# Patient Record
Sex: Male | Born: 1940 | Race: White | Hispanic: No | Marital: Married | Smoking: Former smoker
Health system: Southern US, Community
[De-identification: ages and names within clinical notes are randomized; demographics above are authoritative.]

## PROBLEM LIST (undated history)

## (undated) DIAGNOSIS — K219 Gastro-esophageal reflux disease without esophagitis: Secondary | ICD-10-CM

## (undated) DIAGNOSIS — D649 Anemia, unspecified: Secondary | ICD-10-CM

## (undated) DIAGNOSIS — T4145XA Adverse effect of unspecified anesthetic, initial encounter: Secondary | ICD-10-CM

## (undated) DIAGNOSIS — Z8701 Personal history of pneumonia (recurrent): Secondary | ICD-10-CM

## (undated) DIAGNOSIS — I739 Peripheral vascular disease, unspecified: Secondary | ICD-10-CM

## (undated) DIAGNOSIS — E119 Type 2 diabetes mellitus without complications: Secondary | ICD-10-CM

## (undated) DIAGNOSIS — R351 Nocturia: Secondary | ICD-10-CM

## (undated) DIAGNOSIS — I779 Disorder of arteries and arterioles, unspecified: Secondary | ICD-10-CM

## (undated) DIAGNOSIS — T8859XA Other complications of anesthesia, initial encounter: Secondary | ICD-10-CM

## (undated) DIAGNOSIS — Z8709 Personal history of other diseases of the respiratory system: Secondary | ICD-10-CM

## (undated) DIAGNOSIS — I10 Essential (primary) hypertension: Secondary | ICD-10-CM

## (undated) DIAGNOSIS — N390 Urinary tract infection, site not specified: Secondary | ICD-10-CM

## (undated) DIAGNOSIS — Z973 Presence of spectacles and contact lenses: Secondary | ICD-10-CM

## (undated) DIAGNOSIS — Z87891 Personal history of nicotine dependence: Secondary | ICD-10-CM

## (undated) DIAGNOSIS — E785 Hyperlipidemia, unspecified: Secondary | ICD-10-CM

## (undated) DIAGNOSIS — M1990 Unspecified osteoarthritis, unspecified site: Secondary | ICD-10-CM

## (undated) HISTORY — DX: Type 2 diabetes mellitus without complications: E11.9

## (undated) HISTORY — PX: CAROTID ENDARTERECTOMY: SUR193

## (undated) HISTORY — PX: COLONOSCOPY: SHX174

## (undated) HISTORY — PX: APPENDECTOMY: SHX54

## (undated) HISTORY — PX: TONSILLECTOMY: SUR1361

## (undated) HISTORY — DX: Hyperlipidemia, unspecified: E78.5

## (undated) HISTORY — DX: Disorder of arteries and arterioles, unspecified: I77.9

## (undated) HISTORY — DX: Peripheral vascular disease, unspecified: I73.9

## (undated) HISTORY — PX: WRIST SURGERY: SHX841

## (undated) HISTORY — PX: CARDIAC CATHETERIZATION: SHX172

## (undated) HISTORY — DX: Personal history of nicotine dependence: Z87.891

## (undated) HISTORY — PX: NASAL POLYP SURGERY: SHX186

## (undated) HISTORY — DX: Essential (primary) hypertension: I10

---

## 1987-01-28 HISTORY — PX: OTHER SURGICAL HISTORY: SHX169

## 1999-04-22 ENCOUNTER — Encounter: Payer: Self-pay | Admitting: Cardiology

## 1999-04-22 ENCOUNTER — Inpatient Hospital Stay (HOSPITAL_COMMUNITY): Admission: EM | Admit: 1999-04-22 | Discharge: 1999-04-23 | Payer: Self-pay | Admitting: Emergency Medicine

## 1999-09-24 ENCOUNTER — Ambulatory Visit (HOSPITAL_COMMUNITY): Admission: RE | Admit: 1999-09-24 | Discharge: 1999-09-24 | Payer: Self-pay | Admitting: Gastroenterology

## 2002-10-05 ENCOUNTER — Encounter: Payer: Self-pay | Admitting: Family Medicine

## 2002-10-05 ENCOUNTER — Encounter: Admission: RE | Admit: 2002-10-05 | Discharge: 2002-10-05 | Payer: Self-pay | Admitting: Family Medicine

## 2002-10-21 ENCOUNTER — Ambulatory Visit (HOSPITAL_BASED_OUTPATIENT_CLINIC_OR_DEPARTMENT_OTHER): Admission: RE | Admit: 2002-10-21 | Discharge: 2002-10-21 | Payer: Self-pay | Admitting: *Deleted

## 2002-10-21 ENCOUNTER — Encounter (INDEPENDENT_AMBULATORY_CARE_PROVIDER_SITE_OTHER): Payer: Self-pay | Admitting: Specialist

## 2002-10-21 ENCOUNTER — Ambulatory Visit (HOSPITAL_COMMUNITY): Admission: RE | Admit: 2002-10-21 | Discharge: 2002-10-21 | Payer: Self-pay | Admitting: *Deleted

## 2004-10-07 ENCOUNTER — Ambulatory Visit (HOSPITAL_COMMUNITY): Admission: RE | Admit: 2004-10-07 | Discharge: 2004-10-07 | Payer: Self-pay | Admitting: Gastroenterology

## 2007-06-02 ENCOUNTER — Encounter: Admission: RE | Admit: 2007-06-02 | Discharge: 2007-06-02 | Payer: Self-pay | Admitting: Cardiovascular Disease

## 2007-06-08 ENCOUNTER — Ambulatory Visit (HOSPITAL_COMMUNITY): Admission: RE | Admit: 2007-06-08 | Discharge: 2007-06-08 | Payer: Self-pay | Admitting: Cardiology

## 2007-06-22 ENCOUNTER — Ambulatory Visit (HOSPITAL_COMMUNITY): Admission: RE | Admit: 2007-06-22 | Discharge: 2007-06-23 | Payer: Self-pay | Admitting: Cardiovascular Disease

## 2007-06-22 DIAGNOSIS — I739 Peripheral vascular disease, unspecified: Secondary | ICD-10-CM

## 2007-06-22 HISTORY — DX: Peripheral vascular disease, unspecified: I73.9

## 2010-06-11 NOTE — Cardiovascular Report (Signed)
NAMECRESPIN, MOSTOLLER NO.:  [id number]   MEDICAL RECORD NO.:  [id number]          PATIENT TYPE:  OIB   LOCATION:  4743                         FACILITY:  [facility]   PHYSICIAN:  Nanetta Batty, M.D.   DATE OF BIRTH:  [birth year]   DATE OF PROCEDURE:  DATE OF DISCHARGE:                            CARDIAC CATHETERIZATION   Mr. William Burke is a 70 year old mildly overweight Caucasian male with  history of remote tobacco abuse, diabetes, and hypertension.  He was  referred for right hip pain.  Dopplers at our the office suggested  bilateral iliac disease.  He had normal 2D echo and Myoview stress test.  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  He presents now for PTA and cover stenting of  both iliac arteries.  Dr. Jacinto Halim assisted.   PROCEDURE DESCRIPTION:  The patient was brought to second floor [facility] in postabsorptive state.  He was premedicated  with p.o. Valium, IV fentanyl, and Versed.  His right and left groins  were prepped and draped in the usual sterile fashion.  Xylocaine 1% was  used as local anesthesia.  A 7-French 30-cm long bright tip Cordis  sheath was then inserted in the right and left common femoral arteries  under fluoroscopic control.  A 35 Wholey wires were used to cross the  lesions bilaterally.  The patient received 4000 units of heparin  intravenously.  Visipaque dye was used in the entirety of the case.  Abdominal aortogram was performed with a marker pigtail through the  left sheath allowing [place] to measure the length of the aneurysmal segment.  Two 7 x 59 [employer] iCAST PTFE cover stents were then chosen and  placed over the 0.035 Wholey wires through the 7-French sheath and  carefully positioned at the origin of both the iliac arteries with some  difficulty.  These were then deployed at 10  atmospheres simultaneously  using kissing balloon technique.  The origin of the iliac artery  stents at the aortic bifurcation were then simultaneously dilated with  0.082 Powerflexes using kissing balloon technique at 10 atmospheres and  the distal ends of each stent were then snared with a 9 x 4 Powerflex on  the right and 10 x 4 on the left at 6 atmospheres resulting in reduction  of 95% ostial calcific iliac stenosis to 0% residual with exclusion of  the aneurysmal segment in excellent distal apposition.  The patient  tolerated the procedure well and remained electrocardiographically and  hemodynamically stable throughout the case.  Ending ACT was 140.   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  The sheaths were removed and pressures  on the groin to achieve good hemostasis.  The patient left the lab in  stable condition.  He will be gently hydrated overnight, discharged home  in the morning, and will get followup Dopplers as an outpatient.  He  left  the lab in stable condition.      Nanetta Batty, M.D.  Electronically Signed     JB/MEDQ  D:  06/22/2007  T:  06/23/2007  Job:  [id number]   cc:   Marjory Lies, M.D.  [facility]

## 2010-06-11 NOTE — Cardiovascular Report (Signed)
NAMEKEMONTE, ULLMAN                ACCOUNT NO.:  [id number]   MEDICAL RECORD NO.:  [id number]          PATIENT TYPE:  AMB   LOCATION:  SDS                          FACILITY:  [facility]   PHYSICIAN:  Nanetta Batty, M.D.   DATE OF BIRTH:  06/30/1940   DATE OF PROCEDURE:  DATE OF DISCHARGE:                            CARDIAC CATHETERIZATION   Mr. Sallade is a 70 year old gentleman referred to me for claudication.  He is a ex-smoker and does have a history of diabetes and hypertension.  He had a normal Myoview and Dopplers, which suggested high-grade  bilateral iliac disease.  He presents now for angiography to define his  anatomy and potential intervention.   DESCRIPTION OF PROCEDURE:  The patient was brought to the 2nd floor,  [facility] in the postabsorptive state.  He was  premedicated with p.o. Valium and IV fentanyl.  His left groin was  prepped and shaved in usual sterile fashion.  Xylocaine 1% was used for  local anesthesia.  A 5-French sheath was inserted into the left femoral  artery using standard Seldinger technique.  A 5-French tennis-racket  catheter was used along with a wooly wire for abdominal aortography with  bifemoral runoff using digital subtraction bolus-chase step table  technique.  Visipaque dye was used for the entirety of the case.  Aortic  pressure was monitored during the case.   ANGIOGRAPHIC RESULTS:  1. Abdominal aorta;      a.     Renal arteries - normal.      b.     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  2. Left lower extremity;  2A.  90% ostial left common iliac artery stenosis with post dilatation  followed by another 50%-70% stenosis at the takeoff of the hypogastric.  2B.  Two-vessel runoff with occluded anterior tibial.  1. Right lower extremity;  3A.  A 95% calcified ostial right common iliac artery stenosis.  3B.   Two-vessel runoff with occluded anterior tibial.   IMPRESSION:  Mr. Mays has bilateral ostial iliac disease, probably  percutaneously revascularizable.  The wire and catheter were removed.  Sheath was removed and pressures on the groin to achieve hemostasis.  The patient left lab in stable condition.  He will be discharged home  actually as an outpatient.  We will see him back in the office in 1 week  at which time we will schedule his intervention as a combined case with  Dr. Jacinto Halim and myself.  I discussed this with Dr. Doristine Counter. The patient  left the lab in stable condition.      Nanetta Batty, M.D.  Electronically Signed    JB/MEDQ  D:  06/08/2007  T:  06/09/2007  Job:  [id number]   cc:   Marjory Lies, M.D.

## 2010-06-14 NOTE — Op Note (Signed)
NAME:  William Burke, William Burke                          ACCOUNT NO.:  [id number]   MEDICAL RECORD NO.:  [id number]                   PATIENT TYPE:  AMB   LOCATION:  DSC                                  FACILITY:  [facility]   PHYSICIAN:  Kathy Breach, M.D.                   DATE OF BIRTH:  November 27, 1940   DATE OF PROCEDURE:  10/21/2002  DATE OF DISCHARGE:                                 OPERATIVE REPORT   PREOPERATIVE DIAGNOSES:  1. Obstructive nasal and sinus polyps bilaterally.  2. Deviated nasal septum.   POSTOPERATIVE DIAGNOSES:  1. Obstructive nasal and sinus polyps bilaterally.  2. Deviated nasal septum.   PROCEDURE:  1. Nasal septoplasty.  2. Bilateral endoscopic complete ethmoidectomies.  3. Bilateral endoscopic ostial meatal windows with removal of polyps.   DESCRIPTION OF PROCEDURE:  With the patient under general orotracheal  anesthesia a nasal block was applied with 4% Xylocaine with ephedrine soaked  olive-tipped probes applied to the sphenopalatine and anterior ethmoid nerve  areas bilaterally. Then 1% Xylocaine with 1:100,000 solution of epinephrine  was infiltrated in the columella nasal septum and middle meatal areas as the  procedure progressed. Cotton pledgets soaked with 4% Xylocaine with Betadine  solution were inserted bilaterally.   Inspection of the patient's nose after vasoconstricting revealed voluminous  polyps nearly totally obstructing the nose bilaterally that obstructed any  visualization of the anterior aspect of the middle turbinates and hung down  to the lower level of the extent of the inferior  turbinates. The patient  had dislocated nasal septum to the right with a large maximum crest spur in  the forenose to the right and generalized deflection of the septum otherwise  superiorly to the left, making adequate space for the endoscopic procedure  untenable without correction.   A caudal incision was made inside the left nasal vestibule. Mucosal flaps  were  elevated of the cartilaginous and bony septum bilaterally. The  patient's posteroinferior quadrilateral cartilage was dislocated from and  overlapped the maxillary crest which extended into the floor of the nose to  the right. Posteroinferior quadrilateral cartilage was removed along with  the maxillary bony crest. Posterosuperior quadrilateral cartilage was  deflected to the left with deflection of the thickened perpendicular ethmoid  plate. This was all taken down and removed. This allowed the septum to come  to the near midline  position. The patient in general had irritated  appearing and thickened mucosal surfaces as well as the mucoid polyps  appearing somewhat inflammatory.   Under endoscopic visualization, using the suction debrider, beginning on the  left side, polyps were  removed, extending back, identifying the anterior  aspect of the middle  turbinate. The polyps were  completely cleaned from  the middle meatus and a total complete ethmoidectomy was completed. Evident  on the preoperative CT scans was a large posterior ethmoid cell impinging  into the  maxillary sinus  area immediately posterior  to the ostium. This  was encountered and completely cleaned. The natural ostium of the maxillary  sinus was enlarged posteriorly and mucosal polypoid tissue cleaned as it  presented itself to the suction debrider. The anterior ethmoid cells were  cleaned going superiorly, taking down the infundibulum.   A basically similar procedure was completed on the right side which shows a  little more involved with polyp disease, particularly a large amount of  polyps extending over the middle turbinate from the posterior ethmoids.  There is moderate bleeding throughout the case encountered. At the  completion of the case, the septal incision was closed with interrupted 4-0  chromic catgut sutures.   The nose was then packed with Vaseline gauze impregnated with Bacitracin  ointment packing the  nose and the ethmoidectomy sites firmly for hemostasis.  An additional half length of Vaseline gauze was required on both sides to  adequately completely pack the patient's nose and  obtain hemostasis.   Once the nose was packed, the oral cavity was inspected and suctioned clear.  No significant bleeding was occurring into the posterior  wall from the  posterior  nose. Blood loss by suction canister measurement was 600 mL. The  patient tolerated  the procedure satisfactorily and was taken to the  recovery room in stable general condition.                                               Kathy Breach, M.D.    Venia Minks  D:  10/21/2002  T:  10/22/2002  Job:  [id number]

## 2010-06-14 NOTE — Op Note (Signed)
NAMEGEAROLD, William Burke                ACCOUNT NO.:  [id number]   MEDICAL RECORD NO.:  [id number]          PATIENT TYPE:  AMB   LOCATION:  ENDO                         FACILITY:  [facility]   PHYSICIAN:  Anselmo Rod, M.D.  DATE OF BIRTH:  [birth year]   DATE OF PROCEDURE:  10/07/2004  DATE OF DISCHARGE:                                 OPERATIVE REPORT   PROCEDURE PERFORMED:  Screening colonoscopy.   ENDOSCOPIST:  Charna Elizabeth, M.D.   INSTRUMENT USED:  Olympus video colonoscope.   INDICATION FOR PROCEDURE:  A 70 year old white male with a family history of  colon cancer, status post right hemicolectomy for complicated appendicitis,  rule out colonic polyps, masses, etc.   PREPROCEDURE PREPARATION:  Informed consent was procured from the patient.  The patient was fasted for 8 hours prior to the procedure and prepped with a  bottle of magnesium citrate and a gallon of GoLYTELY the night prior to  procedure.  The risks and benefits of the procedure including a 10% miss  rate of cancer and polyps was discussed with the patient as well.   PREPROCEDURE PHYSICAL:  Patient had stable vital signs.  NECK:  Supple.  CHEST:  Clear to auscultation.  S1, S2 regular.  ABDOMEN:  Soft with normal bowel sounds.   DESCRIPTION OF THE PROCEDURE:  The patient was placed in the left lateral  decubitus position, sedated with 75 mg of Demerol and 7.5 mg of Versed in  slow incremental doses.  Once the patient was adequately sedated and  maintained on low flow oxygen and continuous cardiac monitoring, the Olympus  video colonoscope was advanced from the rectum to 100 mL without difficulty.  The patient had a large amount of residual stool in the colon, multiple  washings were done.  The anastomosis appeared healthy.  No masses or polyps  were seen.  There was no evidence of diverticulosis.  Small lesions could be  missed.  Retroflexion of the rectum revealed no abnormalities.   IMPRESSION:  1.  Healthy  anatomosis at 100 cm.  2.  Large amount of residual stool in the colon, patient status post right      hemicolectomy.  3.  No masses, polyps or diverticula seen.   RECOMMENDATIONS:  1.  Continue on high fiber diet with __________ intake.  2.  Repeat colonoscopy in the next 10 years unless the patient develops any      abnormal symptoms in the interim.  3.  Outpatient as need arises in the future.      Anselmo Rod, M.D.  Electronically Signed     JNM/MEDQ  D:  10/07/2004  T:  10/07/2004  Job:  [id number]   cc:   Bernadette Hoit, Dr.  [facility]  [address] [place] [address] [place] [address]

## 2010-06-14 NOTE — Procedures (Signed)
[place]  Patient:    William Burke, William Burke                       MRN: [id number] Proc. Date: 09/24/99 Adm. Date:  [id number] Attending:  Charna Elizabeth CC:         Nolon Nations, M.D., M.P.H.   Procedure Report  REFERRING PHYSICIAN:  Nolon Nations, M.D., M.P.H.  PROCEDURE PERFORMED:  Colonoscopy.  ENDOSCOPIST:  Anselmo Rod, M.D.  INSTRUMENT USED:  Olympus video colonoscope.  INDICATION FOR PROCEDURE:  Personal history of polyps in a 70 year old male The patient has had small bowel and right colon resection in the past (for reasons unclear to me).  Rule out recurrent polyps.  PREPROCEDURE PREPARATION:  Informed consent was procured from the patient. The patient was fasted for eight hours prior to the procedure and prepped with a bottle of magnesium citrate and a gallon of NuLytely on the night prior to the procedure.  PREPROCEDURE PHYSICAL:  VITAL SIGNS:  The patient had stable vital signs.  NECK:  Supple.  LUNGS:  Chest was clear to auscultation.  CARDIAC:  S1, S2 is regular.  ABDOMEN:  Soft with a well-healed infraumbilical abdominal incision.  No hepatosplenomegaly.  No masses palpable.  DESCRIPTION OF PROCEDURE:  The patient was placed in the left lateral decubitus position.  He was sedated with 55 mg of Demerol and 6 mg Versed intravenously.  Once the patient was adequately sedated and maintained on low-flow oxygen and continuous cardiac monitoring, the Olympus video colonoscope was advanced from the rectum to 1.0 meter without difficulty.  There was no evidence of masses, polyps, erosions, ulcerations or diverticular disease.  A healthy anastomosis was seen, with healthy-appearing small bowel mucosa at 100 cm.  The patient tolerated the procedure well without complications.  Small internal hemorrhoids were appreciated on retroflexion.  DIRECT IMPRESSION:  Essentially healthy appearing colon up to 100 cm.   Healthy anastomosis present, with healthy distal small bowel.  There are small, nonbleeding internal hemorrhoids.  RECOMMENDATIONS:  Repeat colorectal cancer screening is recommended in the next five years, considering his personal history of polyps.  The patient may want to have this done earlier if he develops any abnormal symptoms, such as GI bleeding, changes in bowel habits, et Karie Soda in the interim.  The patient is to follow up with his primary care physician.  He will contact me for any GI problems. DD:  09/24/99 TD:  09/24/99 Job: [id number]

## 2010-06-14 NOTE — H&P (Signed)
[place]  Patient:    William Burke, William Burke                       MRN: [id number] Adm. Date:  [id number] Attending:  Loreli Dollar Dictator:   Weyman Pedro, P.A. CC:         Kristian Covey, M.D.             Thereasa Solo. Little, M.D.                         History and Physical  CHIEF COMPLAINT:  Atrial fibrillation/flutter with rapid ventricular response.  HISTORY OF PRESENT ILLNESS:  The patient is a 70 year old male with a history of hypertension and benign prostatic hypertrophy who presented to primary M.D. last Thursday with change in blood pressure medications.  He started feeling weak on  Friday and continued until Saturday evening when he returned to primary physician secondary to low blood pressure and generalized weakness.  On Saturday evening heart rate was up to 149 with associated symptoms.  Today he is feeling heaviness in his chest with elevated heart rate.  Saturday morning he was light-headed with blood pressure decreased.  Blood pressure and pulse over the weekend with noted  increased heart rate.  Presented back to primary M.D. today secondary to elevated heart rate.  Dr. Clarene Duke was called, and the patient was transferred to the emergency room by private car.  Currently in the ER with a heart rate of 150 with EKG showing 2:1 atrial flutter.  Labs and portable chest x-ray are pending. Dr. Clarene Duke tried carotid massage and was able to see an atrial flutter presentation.  PAST MEDICAL HISTORY: 1. Hypertension x 1 year, was borderline prior to that. 2. Benign prostatic hypertrophy x 20 years. 3. Appendectomy with small bowel and large bowel resection in 1993. 4. No history of myocardial infarction, cerebrovascular accident, diabetes, cancer,    thyroid, or stroke. 5. Remote history of tobacco.  Quit one year ago, but was smoking two to 2-1/2    packs per day 40+ years. 6. Obesity. 7. History of kidney  infections.  CURRENT MEDICATIONS: 1. Accupril 20 mg p.o. q.d. 2. Hytrin 10 mg p.o. q.d. 3. Enteric-coated aspirin 325 mg p.o. q.d.  ALLERGIES:  No known drug allergies.  REVIEW OF SYSTEMS:  Positive for epistaxis at least once a week, stops after several minutes.  Increased frequency of urination with a kidney infection and prostatitis (nocturia 2-3 times a night).  Elevated weight gain of 20 pounds over the last year secondary to smoking cessation.  No skin changes, cough, nausea, vomiting, fever, chills, peptic ulcer disease, gastrointestinal bleed, constipation, diarrhea, urgency, thyroid, joint pain, or weakness.  SOCIAL HISTORY:  Married for the second time.  Father of six, grandfather of eight, great-grandfather of one.  Lives in [place].  Denies ETOH or tobacco currently. Regular diet with exercise on treadmill three times a week for at least 20 minutes at a time.  FAMILY HISTORY:  Mother deceased at age 41 with a history of hypertension and cerebral hemorrhage.  Father deceased at age 66 from multiple sclerosis with multiple complications and organ damage.  Brother alive at age 53 with a history of hypertension.  Sister alive at age 94 with no known medical problems.  No heart  disease known to the patient at the present time.  PHYSICAL EXAMINATION:  VITAL SIGNS:  Blood pressure  131/93, pulse 152, respirations 18.  GENERAL:  Alert and oriented x 3 in no acute distress.  Appears stated age.  HEENT:  Nares patent, mucosa moist.  NECK:  Supple, no jugular venous distension, no LAD, no bruits.  LUNGS:  Clear with expiratory wheeze.  HEART:  Regular rate, tachy flutter, no gross murmurs.  ABDOMEN:  Obese, soft, nontender, no bruits, no hepatosplenomegaly.  EXTREMITIES:  No clubbing, cyanosis.  Good radial and 2+ distal pulses, 0 to 1+  pitting soft edema 1/2 the way up bilateral calves.  ASSESSMENT AND PLAN:  Rapid atrial fibrillation.  The patient was given  6 mg of IV Adenocard without spontaneous conversion.  Started on IV Cardizem at 10 mcg a minute after a 20 mg bolus.  We will check a 2-D echocardiogram and TSH.  Admit to telemetry bed for further evaluation.  We will start him on intravenous heparin and Coumadin today. DD:  04/22/99 TD:  04/22/99 Job: [id number]

## 2010-10-23 LAB — CBC
HCT: 35.6 — ABNORMAL LOW
Hemoglobin: 12.3 — ABNORMAL LOW
MCHC: 34.4
MCV: 90.3
Platelets: 162
RBC: 3.95 — ABNORMAL LOW
RDW: 12.8
WBC: 6.2

## 2010-10-23 LAB — BASIC METABOLIC PANEL
BUN: 14
CO2: 27
Calcium: 8.5
Chloride: 100
Creatinine, Ser: 0.69
GFR calc Af Amer: >60
GFR calc non Af Amer: >60
Glucose, Bld: 152 — ABNORMAL HIGH
Potassium: 4.1
Sodium: 135

## 2012-06-09 ENCOUNTER — Other Ambulatory Visit (HOSPITAL_COMMUNITY): Payer: Self-pay | Admitting: Cardiovascular Disease

## 2012-06-09 DIAGNOSIS — I739 Peripheral vascular disease, unspecified: Secondary | ICD-10-CM

## 2012-06-09 DIAGNOSIS — I6529 Occlusion and stenosis of unspecified carotid artery: Secondary | ICD-10-CM

## 2012-06-24 ENCOUNTER — Ambulatory Visit (HOSPITAL_COMMUNITY)
Admission: RE | Admit: 2012-06-24 | Discharge: 2012-06-24 | Disposition: A | Discharge status: Home / Self Care | Payer: Medicare Other | Source: Ambulatory Visit | Attending: Cardiovascular Disease | Admitting: Cardiovascular Disease

## 2012-06-24 DIAGNOSIS — I6529 Occlusion and stenosis of unspecified carotid artery: Secondary | ICD-10-CM

## 2012-06-24 DIAGNOSIS — I70219 Atherosclerosis of native arteries of extremities with intermittent claudication, unspecified extremity: Secondary | ICD-10-CM

## 2012-06-24 DIAGNOSIS — I739 Peripheral vascular disease, unspecified: Secondary | ICD-10-CM | POA: Insufficient documentation

## 2012-06-24 NOTE — Progress Notes (Signed)
Carotid Duplex Completed. °William Burke ° °

## 2012-06-24 NOTE — Progress Notes (Signed)
Arterial Duplex Completed. William Burke  

## 2012-06-30 ENCOUNTER — Telehealth: Payer: Self-pay | Admitting: *Deleted

## 2012-06-30 DIAGNOSIS — I739 Peripheral vascular disease, unspecified: Secondary | ICD-10-CM

## 2012-06-30 NOTE — Telephone Encounter (Signed)
Message copied by Marella Bile on Wed Jun 30, 2012  1:17 PM ------      Message from: Runell Gess      Created: Wed Jun 30, 2012  6:41 AM       No change from prior study. Repeat in 12 months. ------

## 2012-07-13 ENCOUNTER — Encounter: Payer: Self-pay | Admitting: Physician Assistant

## 2012-07-13 DIAGNOSIS — I10 Essential (primary) hypertension: Secondary | ICD-10-CM | POA: Insufficient documentation

## 2012-07-13 DIAGNOSIS — E119 Type 2 diabetes mellitus without complications: Secondary | ICD-10-CM

## 2012-07-13 DIAGNOSIS — Z87891 Personal history of nicotine dependence: Secondary | ICD-10-CM

## 2012-07-13 DIAGNOSIS — E785 Hyperlipidemia, unspecified: Secondary | ICD-10-CM

## 2012-07-13 DIAGNOSIS — I739 Peripheral vascular disease, unspecified: Secondary | ICD-10-CM

## 2012-07-14 ENCOUNTER — Encounter: Payer: Self-pay | Admitting: Cardiovascular Disease

## 2012-07-15 ENCOUNTER — Ambulatory Visit: Payer: Medicare Other | Admitting: Cardiovascular Disease

## 2012-08-05 ENCOUNTER — Ambulatory Visit (INDEPENDENT_AMBULATORY_CARE_PROVIDER_SITE_OTHER): Payer: Medicare Other | Admitting: Cardiovascular Disease

## 2012-08-05 ENCOUNTER — Encounter: Payer: Self-pay | Admitting: Cardiovascular Disease

## 2012-08-05 VITALS — BP 130/76 | HR 70 | Ht 65.0 in | Wt 193.9 lb

## 2012-08-05 DIAGNOSIS — Z87891 Personal history of nicotine dependence: Secondary | ICD-10-CM

## 2012-08-05 DIAGNOSIS — I739 Peripheral vascular disease, unspecified: Secondary | ICD-10-CM

## 2012-08-05 DIAGNOSIS — I10 Essential (primary) hypertension: Secondary | ICD-10-CM

## 2012-08-05 NOTE — Progress Notes (Signed)
08/05/2012 William Burke   [birth year]  [id number]  Primary Physician Delorse Lek, MD Primary Cardiologist: Runell Gess MD [name]   HPI:  The patient is a very pleasant 72 year old mildly to moderately overweight married Caucasian male father of 20, grandfather to 6 grandchildren who I saw a year ago. He has a history of bilateral iliac artery PTA and stenting by myself Jun 22, 2007 using iCAST covered stents for lifestyle-limiting claudication. This resulted in marked improvement in his symptoms on Dopplers. His other problems include non-insulin-requiring diabetes, hypertension and hyperlipidemia as well as remote tobacco abuse. He denies chest pain or shortness of breath. A Myoview performed in April of 2009 was nonischemic. An echo revealed normal LV systolic function with mild concentric LVH. He does have bilateral carotid disease which by duplex ultrasound performed this past June showed progression of disease on both sides. He is neurologically asymptomatic. Recent lower extremity Dopplers performed 06/24/12 showed his stents to be widely patent.carotid Dopplers performed on the same day showed significant progression of disease on the right side now with a hemodynamic significant stenosis. Most recent lab work performed earlier this month revealed a total cholesterol of 157, LDL of 89, and HDL of 38.      Current Outpatient Prescriptions  Medication Sig Dispense Refill  . aspirin 81 MG tablet Take 81 mg by mouth daily.      [name] atorvastatin (LIPITOR) 80 MG tablet Take 80 mg by mouth daily.      [name] CINNAMON PO Take 1,000 mg by mouth daily.      [name] diltiazem (TIAZAC) 240 MG 24 hr capsule Take 240 mg by mouth 2 (two) times daily.      . fish oil-omega-3 fatty acids 1000 MG capsule Take 1 g by mouth daily.      . Flaxseed, Linseed, (FLAXSEED OIL) 1000 MG CAPS Take 1 capsule by mouth daily.      . fluticasone (VERAMYST) 27.5 MCG/SPRAY nasal spray Place 1 spray into the  nose daily.      . Garlic 1000 MG CAPS Take 2 capsules by mouth daily.      [name] glipiZIDE (GLUCOTROL) 10 MG tablet Take 1 tablet by mouth 2 (two) times daily.      . metFORMIN (GLUCOPHAGE) 1000 MG tablet Take 1,000 mg by mouth 2 (two) times daily with a meal.      . niacin 500 MG tablet Take 500 mg by mouth daily with breakfast.      . POTASSIUM GLUCONATE PO Take 99 mg by mouth daily.      . quinapril-hydrochlorothiazide (ACCURETIC) 20-12.5 MG per tablet Take 1 tablet by mouth 2 (two) times daily.      [name] terazosin (HYTRIN) 10 MG capsule Take 10 mg by mouth at bedtime.      . vitamin C (ASCORBIC ACID) 500 MG tablet Take 1,000 mg by mouth daily.      [name] zinc gluconate 50 MG tablet Take 50 mg by mouth daily.       No current facility-administered medications for this visit.    Allergies  Allergen Reactions  . Plavix (Clopidogrel Bisulfate)     Bleeding    History   Social History  . Marital Status: Married    Spouse Name: N/A    Number of Children: N/A  . Years of Education: N/A   Occupational History  . Not on file.   Social History Main Topics  . Smoking status: Former Smoker -- 3.00 packs/day  for 25 years    Types: Cigarettes    Quit date: 08/06/1987  . Smokeless tobacco: Never Used  . Alcohol Use: No  . Drug Use: No  . Sexually Active: Not on file   Other Topics Concern  . Not on file   Social History Narrative  . No narrative on file     Review of Systems: General: negative for chills, fever, night sweats or weight changes.  Cardiovascular: negative for chest pain, dyspnea on exertion, edema, orthopnea, palpitations, paroxysmal nocturnal dyspnea or shortness of breath Dermatological: negative for rash Respiratory: negative for cough or wheezing Urologic: negative for hematuria Abdominal: negative for nausea, vomiting, diarrhea, bright red blood per rectum, melena, or hematemesis Neurologic: negative for visual changes, syncope, or dizziness All other systems  reviewed and are otherwise negative except as noted above.    Blood pressure 130/76, pulse 70, height 5\' 5"  (1.651 m), weight 193 lb 14.4 oz (87.952 kg).  General appearance: alert and no distress Neck: no adenopathy, no JVD, supple, symmetrical, trachea midline, thyroid not enlarged, symmetric, no tenderness/mass/nodules and bilateral carotid bruits Lungs: clear to auscultation bilaterally Heart: regular rate and rhythm, S1, S2 normal, no murmur, click, rub or gallop Extremities: extremities normal, atraumatic, no cyanosis or edema  EKG normal sinus rhythm at 70 with right bundle branch block unchanged from prior EKG  ASSESSMENT AND PLAN:   PAD (peripheral artery disease) Recent lower extremity Dopplers revealed ABIs in the 1 range bilaterally with patent iliac Icast covered stents. The patient denies claudication. Carotid Dopplers performed 06/24/12 revealed progression of the disease of his right internal carotid artery probably in the 90% range. He is neurologically asymptomatic on aspirin. He gets nosebleeds and Plavix. I am going to repeat his carotid Dopplers next month and we'll see him back in September. This is reproduced finding      Runell Gess MD FACP,FACC,FAHA, [facility] 08/05/2012 9:07 AM

## 2012-08-05 NOTE — Patient Instructions (Signed)
Your physician recommends that you schedule a follow-up appointment in: in September  Your physician has requested that you have a carotid duplex. This test is an ultrasound of the carotid arteries in your neck. It looks at blood flow through these arteries that supply the brain with blood. Allow one hour for this exam. There are no restrictions or special instructions.

## 2012-08-05 NOTE — Assessment & Plan Note (Signed)
Recent lower extremity Dopplers revealed ABIs in the 1 range bilaterally with patent iliac Icast covered stents. The patient denies claudication. Carotid Dopplers performed 06/24/12 revealed progression of the disease of his right internal carotid artery probably in the 90% range. He is neurologically asymptomatic on aspirin. He gets nosebleeds and Plavix. I am going to repeat his carotid Dopplers next month and we'll see him back in September. This is reproduced finding

## 2012-08-06 ENCOUNTER — Ambulatory Visit (HOSPITAL_COMMUNITY)
Admission: RE | Admit: 2012-08-06 | Discharge: 2012-08-06 | Disposition: A | Discharge status: Home / Self Care | Payer: Medicare Other | Source: Ambulatory Visit | Attending: Cardiovascular Disease | Admitting: Cardiovascular Disease

## 2012-08-06 DIAGNOSIS — I10 Essential (primary) hypertension: Secondary | ICD-10-CM

## 2012-08-06 DIAGNOSIS — I739 Peripheral vascular disease, unspecified: Secondary | ICD-10-CM

## 2012-08-06 DIAGNOSIS — I779 Disorder of arteries and arterioles, unspecified: Secondary | ICD-10-CM

## 2012-08-06 DIAGNOSIS — Z87891 Personal history of nicotine dependence: Secondary | ICD-10-CM

## 2012-08-06 NOTE — Progress Notes (Signed)
Carotid Duplex Completed. Preliminary report by tech.. Right ICA 70-99% diameter reduction. Left ICA 50-69% diameter reduction.  Marilynne Halsted, RDMS, RVT

## 2012-08-19 ENCOUNTER — Encounter: Payer: Self-pay | Admitting: Cardiovascular Disease

## 2012-08-19 ENCOUNTER — Ambulatory Visit (INDEPENDENT_AMBULATORY_CARE_PROVIDER_SITE_OTHER): Payer: Medicare Other | Admitting: Cardiovascular Disease

## 2012-08-19 VITALS — BP 140/80 | HR 76 | Ht 65.0 in | Wt 195.0 lb

## 2012-08-19 DIAGNOSIS — I779 Disorder of arteries and arterioles, unspecified: Secondary | ICD-10-CM

## 2012-08-19 DIAGNOSIS — Z01818 Encounter for other preprocedural examination: Secondary | ICD-10-CM

## 2012-08-19 NOTE — Progress Notes (Signed)
08/19/2012 William Burke   08/31/1940  [id number]  Primary Physician Delorse Lek, MD Primary Cardiologist: Runell Gess MD [name]   HPI:  The patient is a very pleasant 72 year old mildly to moderately overweight married Caucasian male father of 35, grandfather to 6 grandchildren who I saw a year ago. He has a history of bilateral iliac artery PTA and stenting by myself Jun 22, 2007 using iCAST covered stents for lifestyle-limiting claudication. This resulted in marked improvement in his symptoms on Dopplers. His other problems include non-insulin-requiring diabetes, hypertension and hyperlipidemia as well as remote tobacco abuse. He denies chest pain or shortness of breath. A Myoview performed in April of 2009 was nonischemic. An echo revealed normal LV systolic function with mild concentric LVH. He does have bilateral carotid disease which by duplex ultrasound performed this past June showed progression of disease on both sides. He is neurologically asymptomatic. Recent lower extremity Dopplers performed 06/24/12 showed his stents to be widely patent.carotid Dopplers performed on the same day showed significant progression of disease on the right side now with a hemodynamic significant stenosis. Most recent lab work performed earlier this month revealed a total cholesterol of 157, LDL of 89, and HDL of 38.  He had carotid Dopplers performed 08/06/12 that showed significant progression of disease on the right side now in the critical range. He does complain of some "tingling" in his right cheek. He is intolerant to Plavix because of epistaxis. I'm going to refer him to Dr. Durene Cal at [facility] for elective right carotid endarterectomy.     Current Outpatient Prescriptions  Medication Sig Dispense Refill  . aspirin 81 MG tablet Take 81 mg by mouth daily.      [name] atorvastatin (LIPITOR) 80 MG tablet Take 80 mg by mouth daily.      [name] CINNAMON PO Take 1,000 mg by mouth daily.       [name] diltiazem (TIAZAC) 240 MG 24 hr capsule Take 240 mg by mouth 2 (two) times daily.      . fish oil-omega-3 fatty acids 1000 MG capsule Take 1 g by mouth daily.      . Flaxseed, Linseed, (FLAXSEED OIL) 1000 MG CAPS Take 1 capsule by mouth daily.      . fluticasone (VERAMYST) 27.5 MCG/SPRAY nasal spray Place 1 spray into the nose daily.      . Garlic 1000 MG CAPS Take 2 capsules by mouth daily.      [name] glipiZIDE (GLUCOTROL) 10 MG tablet Take 1 tablet by mouth 2 (two) times daily.      . metFORMIN (GLUCOPHAGE) 1000 MG tablet Take 1,000 mg by mouth 2 (two) times daily with a meal.      . niacin 500 MG tablet Take 500 mg by mouth daily with breakfast.      . POTASSIUM GLUCONATE PO Take 99 mg by mouth daily.      . quinapril-hydrochlorothiazide (ACCURETIC) 20-12.5 MG per tablet Take 1 tablet by mouth 2 (two) times daily.      [name] terazosin (HYTRIN) 10 MG capsule Take 10 mg by mouth at bedtime.      . vitamin C (ASCORBIC ACID) 500 MG tablet Take 1,000 mg by mouth daily.      [name] zinc gluconate 50 MG tablet Take 50 mg by mouth daily.       No current facility-administered medications for this visit.    Allergies  Allergen Reactions  . Plavix (Clopidogrel Bisulfate)     Bleeding  History   Social History  . Marital Status: Married    Spouse Name: N/A    Number of Children: N/A  . Years of Education: N/A   Occupational History  . Not on file.   Social History Main Topics  . Smoking status: Former Smoker -- 3.00 packs/day for 25 years    Types: Cigarettes    Quit date: 08/06/1987  . Smokeless tobacco: Never Used  . Alcohol Use: No  . Drug Use: No  . Sexually Active: Not on file   Other Topics Concern  . Not on file   Social History Narrative  . No narrative on file     Review of Systems: General: negative for chills, fever, night sweats or weight changes.  Cardiovascular: negative for chest pain, dyspnea on exertion, edema, orthopnea, palpitations, paroxysmal nocturnal  dyspnea or shortness of breath Dermatological: negative for rash Respiratory: negative for cough or wheezing Urologic: negative for hematuria Abdominal: negative for nausea, vomiting, diarrhea, bright red blood per rectum, melena, or hematemesis Neurologic: negative for visual changes, syncope, or dizziness All other systems reviewed and are otherwise negative except as noted above.    Blood pressure 140/80, pulse 76, height 5\' 5"  (1.651 m), weight 195 lb (88.451 kg).  General appearance: alert and no distress Neck: no adenopathy, no JVD, supple, symmetrical, trachea midline, thyroid not enlarged, symmetric, no tenderness/mass/nodules and bruits bilaterally Lungs: clear to auscultation bilaterally Heart: regular rate and rhythm, S1, S2 normal, no murmur, click, rub or gallop Extremities: extremities normal, atraumatic, no cyanosis or edema  EKG not performed today  ASSESSMENT AND PLAN:   Carotid artery disease Patient has had serial Doppler studies for office rapid progression on the right nail in the critical range. He has complained of some tingling" in his right cheek. He is on aspirin and is intolerant to Plavix because of epistaxis. He therefore is not a carotid stent candidate and I am referring him to Dr. Durene Cal for elective right carotid endarterectomy. I am going to obtain a Myoview stress test on him to preoperatively to risk stratify him. I will see him back in 3 months.      Runell Gess MD FACP,FACC,FAHA, [facility] 08/19/2012 9:09 AM

## 2012-08-19 NOTE — Patient Instructions (Addendum)
  We will see you back in follow up in 3 months with Dr Allyson Sabal  Dr Allyson Sabal has ordered [facility] and a referral to Dr Myra Gianotti (vascular surgeon) to discuss carotid endarterectomy.  Your physician has requested that you have a lexiscan myoview. For further information please visit [email or web address] Please follow instruction sheet, as given.

## 2012-08-19 NOTE — Assessment & Plan Note (Signed)
Patient has had serial Doppler studies for office rapid progression on the right nail in the critical range. He has complained of some tingling" in his right cheek. He is on aspirin and is intolerant to Plavix because of epistaxis. He therefore is not a carotid stent candidate and I am referring him to Dr. Durene Cal for elective right carotid endarterectomy. I am going to obtain a Myoview stress test on him to preoperatively to risk stratify him. I will see him back in 3 months.

## 2012-08-20 ENCOUNTER — Encounter: Payer: Self-pay | Admitting: Surgery

## 2012-08-20 ENCOUNTER — Other Ambulatory Visit: Payer: Self-pay

## 2012-08-23 ENCOUNTER — Ambulatory Visit (INDEPENDENT_AMBULATORY_CARE_PROVIDER_SITE_OTHER): Payer: Medicare Other | Admitting: Surgery

## 2012-08-23 ENCOUNTER — Other Ambulatory Visit (INDEPENDENT_AMBULATORY_CARE_PROVIDER_SITE_OTHER): Payer: Medicare Other | Admitting: *Deleted

## 2012-08-23 ENCOUNTER — Encounter (HOSPITAL_COMMUNITY): Payer: Medicare Other

## 2012-08-23 ENCOUNTER — Encounter: Payer: Self-pay | Admitting: Surgery

## 2012-08-23 DIAGNOSIS — I6529 Occlusion and stenosis of unspecified carotid artery: Secondary | ICD-10-CM

## 2012-08-23 NOTE — Progress Notes (Signed)
[facility] [place]   Patient name: William Burke MRN: [id number] DOB: 1940/12/18 Sex: male   Referred by: Dr. Allyson Sabal  Reason for referral:  Chief Complaint  Patient presents with  . New Evaluation    Carotid stenosis Ref. by Dr. Erlene Quan 70-99%  C/o face tingling off/on 6 months.    HISTORY OF PRESENT ILLNESS: This is a very pleasant 72 year old gentleman that is referred for evaluation of right carotid stenosis which is possibly symptomatic. The patient reports for the past 6 months that he has tingling on the right side of his face. It happens several times a week. Occasionally it will be on his left side. He reports no evidence of amaurosis fugax. He denies slurred speech. He denies numbness or weakness in either extremity.  Patient has a history of peripheral vascular disease. He is undergone lower extremity stenting for claudication. He suffers from hypercholesterolemia and is currently taking a statin. He is also medically managed for his hypertension. The patient is a type II diabetic. He is not on insulin. His blood sugars are in the 1:30 to 150 range. He is on single antiplatelet therapy with a baby aspirin. He is intolerant to Plavix secondary to nosebleeds.  Past Medical History  Diagnosis Date  . PAD (peripheral artery disease) 06/22/2007    Bilateral iliac artery PTA and stenting with iCAST. Last lower extremity arterial Dopplers 02/25/2011 right ABI 1.0 left ABI 1.1.  Bilateral carotid artery stenosis. Last carotid Dopplers 07/18/2011: Max ICA stenosis 50-69% bilaterally  . Diabetes mellitus, type 2   . Hypertension   . Hyperlipidemia   . History of tobacco abuse   . Carotid artery disease     Past Surgical History  Procedure Laterality Date  . Bowl surgery  1989    History   Social History  . Marital Status: Married    Spouse Name: N/A    Number of Children: N/A  . Years of Education: N/A   Occupational History  . Not on file.    Social History Main Topics  . Smoking status: Former Smoker -- 3.00 packs/day for 25 years    Types: Cigarettes    Quit date: 08/06/1987  . Smokeless tobacco: Never Used  . Alcohol Use: No  . Drug Use: No  . Sexually Active: Not on file   Other Topics Concern  . Not on file   Social History Narrative  . No narrative on file    Family History  Problem Relation Age of Onset  . Diabetes Mother   . Hypertension Mother   . Cerebral aneurysm Mother   . Multiple sclerosis Father   . Kidney disease Father     Allergies as of 08/23/2012 - Review Complete 08/23/2012  Allergen Reaction Noted  . Plavix (clopidogrel bisulfate)  08/05/2012    Burke Outpatient Prescriptions on File Prior to Visit  Medication Sig Dispense Refill  . aspirin 81 MG tablet Take 81 mg by mouth daily.      [name] atorvastatin (LIPITOR) 80 MG tablet Take 80 mg by mouth daily.      [name] CINNAMON PO Take 1,000 mg by mouth daily.      [name] diltiazem (TIAZAC) 240 MG 24 hr capsule Take 240 mg by mouth 2 (two) times daily.      . fish oil-omega-3 fatty acids 1000 MG capsule Take 1 g by mouth daily.      . Flaxseed, Linseed, (FLAXSEED OIL) 1000 MG CAPS Take 1 capsule by  mouth daily.      . fluticasone (VERAMYST) 27.5 MCG/SPRAY nasal spray Place 1 spray into the nose daily.      . Garlic 1000 MG CAPS Take 2 capsules by mouth daily.      [name] glipiZIDE (GLUCOTROL) 10 MG tablet Take 1 tablet by mouth 2 (two) times daily.      . metFORMIN (GLUCOPHAGE) 1000 MG tablet Take 1,000 mg by mouth 2 (two) times daily with a meal.      . niacin 500 MG tablet Take 500 mg by mouth daily with breakfast.      . POTASSIUM GLUCONATE PO Take 99 mg by mouth daily.      . quinapril-hydrochlorothiazide (ACCURETIC) 20-12.5 MG per tablet Take 1 tablet by mouth 2 (two) times daily.      [name] terazosin (HYTRIN) 10 MG capsule Take 10 mg by mouth at bedtime.      . vitamin C (ASCORBIC ACID) 500 MG tablet Take 1,000 mg by mouth daily.      [name] zinc gluconate  50 MG tablet Take 50 mg by mouth daily.       No Burke facility-administered medications on file prior to visit.     REVIEW OF SYSTEMS: Cardiovascular: No chest pain, chest pressure, palpitations, orthopnea, or dyspnea on exertion. No claudication or rest pain,  No history of DVT or phlebitis. Pulmonary: No productive cough, asthma or wheezing. Neurologic: No weakness, paresthesias, aphasia, or amaurosis. No dizziness. Hematologic: No bleeding problems or clotting disorders. Musculoskeletal: No joint pain or joint swelling. Gastrointestinal: No blood in stool or hematemesis Genitourinary: No dysuria or hematuria. Psychiatric:: No history of major depression. Integumentary: No rashes or ulcers. Constitutional: No fever or chills.  PHYSICAL EXAMINATION: General: The patient appears their stated age.  Vital signs are BP 137/66  Pulse 71  Resp 16  Ht 5\' 5"  (1.651 m)  Wt 196 lb (88.905 kg)  BMI 32.62 kg/m2  SpO2 97% HEENT:  No gross abnormalities Pulmonary: Respirations are non-labored Musculoskeletal: There are no major deformities.   Neurologic: No focal weakness or paresthesias are detected, Skin: There are no ulcer or rashes noted. Psychiatric: The patient has normal affect. Cardiovascular: There is a regular rate and rhythm without significant murmur appreciated. No carotid bruits  Diagnostic Studies: Outside carotid ultrasound shows greater than 70% right carotid stenosis and 50-69% left carotid stenosis. Ultrasound in our office confirms these results with a mid hyoid bifurcation.    Assessment:  Questionable symptomatic right carotid stenosis Plan: The patient will be scheduled for right carotid endarterectomy. I discussed the risks and benefits of the operation, including the risk of stroke, the risk of nerve injury, the risk of cardiopulmonary complications, and the risk of bleeding. He is scheduled to have a stress test this Thursday. For that reason, I have  scheduled his operation for Thursday, August 7     V. Charlena Cross, M.D. [facility] [place] Office: [phone] Pager:  [phone]

## 2012-08-24 ENCOUNTER — Other Ambulatory Visit: Payer: Self-pay

## 2012-08-25 ENCOUNTER — Encounter (HOSPITAL_COMMUNITY): Payer: Self-pay | Admitting: Pharmacy Technician

## 2012-08-26 ENCOUNTER — Ambulatory Visit (HOSPITAL_COMMUNITY)
Admission: RE | Admit: 2012-08-26 | Discharge: 2012-08-26 | Disposition: A | Discharge status: Home / Self Care | Payer: Medicare Other | Source: Ambulatory Visit | Attending: Cardiovascular Disease | Admitting: Cardiovascular Disease

## 2012-08-26 DIAGNOSIS — E119 Type 2 diabetes mellitus without complications: Secondary | ICD-10-CM | POA: Insufficient documentation

## 2012-08-26 DIAGNOSIS — E669 Obesity, unspecified: Secondary | ICD-10-CM | POA: Insufficient documentation

## 2012-08-26 DIAGNOSIS — I6529 Occlusion and stenosis of unspecified carotid artery: Secondary | ICD-10-CM | POA: Insufficient documentation

## 2012-08-26 DIAGNOSIS — I10 Essential (primary) hypertension: Secondary | ICD-10-CM | POA: Insufficient documentation

## 2012-08-26 DIAGNOSIS — Z01818 Encounter for other preprocedural examination: Secondary | ICD-10-CM

## 2012-08-26 DIAGNOSIS — I739 Peripheral vascular disease, unspecified: Secondary | ICD-10-CM | POA: Insufficient documentation

## 2012-08-26 DIAGNOSIS — Z87891 Personal history of nicotine dependence: Secondary | ICD-10-CM | POA: Insufficient documentation

## 2012-08-26 DIAGNOSIS — I2510 Atherosclerotic heart disease of native coronary artery without angina pectoris: Secondary | ICD-10-CM

## 2012-08-26 DIAGNOSIS — Z01810 Encounter for preprocedural cardiovascular examination: Secondary | ICD-10-CM | POA: Insufficient documentation

## 2012-08-26 DIAGNOSIS — I779 Disorder of arteries and arterioles, unspecified: Secondary | ICD-10-CM

## 2012-08-26 MED ORDER — TECHNETIUM TC 99M SESTAMIBI GENERIC - CARDIOLITE
30.0000 | Freq: Once | INTRAVENOUS | Status: AC | PRN
  Administered 2012-08-26: 30 via INTRAVENOUS

## 2012-08-26 MED ORDER — AMINOPHYLLINE 25 MG/ML IV SOLN
75.0000 mg | Freq: Once | INTRAVENOUS | Status: AC
  Administered 2012-08-26: 75 mg via INTRAVENOUS

## 2012-08-26 MED ORDER — REGADENOSON 0.4 MG/5ML IV SOLN
0.4000 mg | Freq: Once | INTRAVENOUS | Status: AC
  Administered 2012-08-26: 0.4 mg via INTRAVENOUS

## 2012-08-26 MED ORDER — TECHNETIUM TC 99M SESTAMIBI GENERIC - CARDIOLITE
10.0000 | Freq: Once | INTRAVENOUS | Status: AC | PRN
  Administered 2012-08-26: 10 via INTRAVENOUS

## 2012-08-26 NOTE — Procedures (Addendum)
[facility] [address] [place] [address] [phone]  Cardiology Nuclear Med Study  William Burke is a 72 y.o. male     MRN : [id number]     DOB: [birth year]  Procedure Date: 08/26/2012  Nuclear Med Background Indication for Stress Test:  Surgical Clearance History:  NO PRIOR HISTORY REPORTED Cardiac Risk Factors: Carotid Disease, History of Smoking, Hypertension, Lipids, NIDDM, Obesity and PVD  Symptoms:  PT DENIES SYMPTOMS   Nuclear Pre-Procedure Caffeine/Decaff Intake:  7:00pm NPO After: 5:00am   IV Site: R Hand  IV 0.9% NS with Angio Cath:  22g  Chest Size (in):  42"  IV Started by: Emmit Pomfret, RN  Height: 5\' 5"  (1.651 m)  Cup Size: n/a  BMI:  Body mass index is 32.45 kg/(m^2). Weight:  195 lb (88.451 kg)   Tech Comments:  N/A    Nuclear Med Study 1 or 2 day study: 1 day  Stress Test Type:  Lexiscan  Order Authorizing Provider:  Nanetta Batty, MD   Resting Radionuclide: Technetium 46m Sestamibi  Resting Radionuclide Dose: 9.9 mCi   Stress Radionuclide:  Technetium 53m Sestamibi  Stress Radionuclide Dose: 31.7 mCi           Stress Protocol Rest HR: 67 Stress HR: 60  Rest BP: 174/87 Stress BP: 145/60  Exercise Time (min): n/a METS: n/a   Predicted Max HR: 148 bpm % Max HR: 59.46 bpm Rate Pressure Product: 62952  Dose of Adenosine (mg):  n/a Dose of Lexiscan: 0.4 mg  Dose of Atropine (mg): n/a Dose of Dobutamine: n/a mcg/kg/min (at max HR)  Stress Test Technologist: Esperanza Sheets, CCT Nuclear Technologist: Koren Shiver, CNMT   Rest Procedure:  Myocardial perfusion imaging was performed at rest 45 minutes following the intravenous administration of Technetium 57m Sestamibi. Stress Procedure:  The patient received IV Lexiscan 0.4 mg over 15-seconds.  Technetium 47m Sestamibi injected at 30-seconds.  There were no significant changes with Lexiscan.  Quantitative spect images were obtained after a  45 minute delay.  Transient Ischemic Dilatation (Normal <1.22):  0.96 Lung/Heart Ratio (Normal <0.45):  0.34 QGS EDV:  73 ml QGS ESV:  26 ml LV Ejection Fraction: 64%  Signed by     Rest ECG: NSR-RBBB  Stress ECG: No significant change from baseline ECG; rare PVC  QPS Raw Data Images:  Normal; no motion artifact; normal heart/lung ratio. Stress Images:  Normal homogeneous uptake in all areas of the myocardium. Rest Images:  Normal homogeneous uptake in all areas of the myocardium. Subtraction (SDS):  Normal  Impression Exercise Capacity:  Lexiscan with no exercise. BP Response:  Normal blood pressure response. Clinical Symptoms:  No significant symptoms noted. ECG Impression:  No significant ST segment change suggestive of ischemia. Comparison with Prior Nuclear Study: No significant change from previous study  Overall Impression:  Normal stress nuclear study.  LV Wall Motion:  NL LV Function; NL Wall Motion   [name], MD  08/26/2012 7:18 PM

## 2012-08-30 ENCOUNTER — Encounter: Payer: Self-pay | Admitting: *Deleted

## 2012-08-30 ENCOUNTER — Encounter (HOSPITAL_COMMUNITY)
Admission: RE | Admit: 2012-08-30 | Discharge: 2012-08-30 | Disposition: A | Discharge status: Home / Self Care | Payer: Medicare Other | Source: Ambulatory Visit | Attending: Surgery | Admitting: Surgery

## 2012-08-30 ENCOUNTER — Encounter (HOSPITAL_COMMUNITY): Payer: Self-pay

## 2012-08-30 ENCOUNTER — Other Ambulatory Visit: Payer: Self-pay | Admitting: *Deleted

## 2012-08-30 ENCOUNTER — Encounter (HOSPITAL_COMMUNITY)
Admission: RE | Admit: 2012-08-30 | Discharge: 2012-08-30 | Disposition: A | Discharge status: Home / Self Care | Payer: Medicare Other | Source: Ambulatory Visit | Attending: Anesthesiology | Admitting: Anesthesiology

## 2012-08-30 DIAGNOSIS — Z22322 Carrier or suspected carrier of Methicillin resistant Staphylococcus aureus: Secondary | ICD-10-CM

## 2012-08-30 HISTORY — DX: Adverse effect of unspecified anesthetic, initial encounter: T41.45XA

## 2012-08-30 HISTORY — DX: Other complications of anesthesia, initial encounter: T88.59XA

## 2012-08-30 HISTORY — DX: Urinary tract infection, site not specified: N39.0

## 2012-08-30 HISTORY — DX: Gastro-esophageal reflux disease without esophagitis: K21.9

## 2012-08-30 LAB — CBC
MCH: 30.3 pg (ref 26.0–34.0)
MCHC: 33.4 g/dL (ref 30.0–36.0)
Platelets: 166 10*3/uL (ref 150–400)
RBC: 4.46 MIL/uL (ref 4.22–5.81)
RDW: 12.5 % (ref 11.5–15.5)

## 2012-08-30 LAB — URINALYSIS, ROUTINE W REFLEX MICROSCOPIC
Bilirubin Urine: NEGATIVE
Hgb urine dipstick: NEGATIVE
Nitrite: NEGATIVE
Specific Gravity, Urine: 1.008 (ref 1.005–1.030)
pH: 5 (ref 5.0–8.0)

## 2012-08-30 LAB — COMPREHENSIVE METABOLIC PANEL
ALT: 33 U/L (ref 0–53)
AST: 27 U/L (ref 0–37)
Albumin: 3.9 g/dL (ref 3.5–5.2)
Calcium: 9.8 mg/dL (ref 8.4–10.5)
Sodium: 139 mEq/L (ref 135–145)
Total Protein: 7.4 g/dL (ref 6.0–8.3)

## 2012-08-30 LAB — PROTIME-INR
INR: 1.01 (ref 0.00–1.49)
Prothrombin Time: 13.1 seconds (ref 11.6–15.2)

## 2012-08-30 LAB — ABO/RH: ABO/RH(D): A POS

## 2012-08-30 LAB — TYPE AND SCREEN: ABO/RH(D): A POS

## 2012-08-30 LAB — SURGICAL PCR SCREEN
MRSA, PCR: POSITIVE — AB
Staphylococcus aureus: POSITIVE — AB

## 2012-08-30 MED ORDER — MUPIROCIN 2 % EX OINT: TOPICAL_OINTMENT | CUTANEOUS | Status: DC

## 2012-08-30 NOTE — Progress Notes (Signed)
Note from dr berry,stress, ekg 7.14

## 2012-08-30 NOTE — Progress Notes (Signed)
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 Faxed in Rx to [facility] in [place] as per patient request.   Called patient about this and he will start swabs today; surgery is on 09-02-12.

## 2012-08-30 NOTE — Progress Notes (Signed)
08/30/12 1203  OBSTRUCTIVE SLEEP APNEA  Have you ever been diagnosed with sleep apnea through a sleep study? No  Do you snore loudly (loud enough to be heard through closed doors)?  1  Do you often feel tired, fatigued, or sleepy during the daytime? 0  Has anyone observed you stop breathing during your sleep? 0  Do you have, or are you being treated for high blood pressure? 1  BMI more than 35 kg/m2? 0  Age over 73 years old? 1  Neck circumference greater than 40 cm/18 inches? 0 (17)  Gender: 1  Obstructive Sleep Apnea Score 4  Score 4 or greater  Results sent to PCP

## 2012-08-30 NOTE — Pre-Procedure Instructions (Addendum)
William Burke  08/30/2012   Your procedure is scheduled on:  8.7.14  Report to [name] at 830 AM.  Call this number if you have problems the morning of surgery: [phone]   Remember:   Do not eat food or drink liquids after midnight.   Take these medicines the morning of surgery with A SIP OF WATER: hytrin, aspirin unless inst  otherwise by dr             Despina Arias all over the counter meds -cinnamon,flaxseed, garlic, fish oil, vitamins,zinc,    Do not wear jewelry, make-up or nail polish.  Do not wear lotions, powders, or perfumes. You may wear deodorant.  Do not shave 48 hours prior to surgery. Men may shave face and neck.  Do not bring valuables to the hospital.  [name] is not responsible                   for any belongings or valuables.  Contacts, dentures or bridgework may not be worn into surgery.  Leave suitcase in the car. After surgery it may be brought to your room.  For patients admitted to the hospital, checkout time is 11:00 AM the day of  discharge.   Patients discharged the day of surgery will not be allowed to drive  home.  Name and phone number of your driver:   Special Instructions: Shower using CHG 2 nights before surgery and the night before surgery.  If you shower the day of surgery use CHG.  Use special wash - you have one bottle of CHG for all showers.  You should use approximately 1/3 of the bottle for each shower.   Please read over the following fact sheets that you were given: Pain Booklet, Coughing and Deep Breathing, Blood Transfusion Information, MRSA Information and Surgical Site Infection Prevention

## 2012-09-01 MED ORDER — SODIUM CHLORIDE 0.9 % IV SOLN: INTRAVENOUS | Status: DC

## 2012-09-01 MED ORDER — DEXTROSE 5 % IV SOLN
1.5000 g | INTRAVENOUS | Status: AC
  Administered 2012-09-02: 1.5 g via INTRAVENOUS
  Filled 2012-09-01: qty 1.5

## 2012-09-02 ENCOUNTER — Encounter (HOSPITAL_COMMUNITY): Payer: Self-pay | Admitting: Anesthesiology

## 2012-09-02 ENCOUNTER — Ambulatory Visit (HOSPITAL_COMMUNITY): Payer: Medicare Other | Admitting: Anesthesiology

## 2012-09-02 ENCOUNTER — Encounter (HOSPITAL_COMMUNITY): Payer: Self-pay | Admitting: *Deleted

## 2012-09-02 ENCOUNTER — Inpatient Hospital Stay (HOSPITAL_COMMUNITY)
Admission: RE | Admit: 2012-09-02 | Discharge: 2012-09-03 | DRG: 039 | Disposition: A | Discharge status: Home / Self Care | Payer: Medicare Other | Source: Ambulatory Visit | Attending: Surgery | Admitting: Surgery

## 2012-09-02 ENCOUNTER — Encounter (HOSPITAL_COMMUNITY)
Admission: RE | Disposition: A | Discharge status: Home / Self Care | Payer: Self-pay | Source: Ambulatory Visit | Attending: Surgery

## 2012-09-02 DIAGNOSIS — I10 Essential (primary) hypertension: Secondary | ICD-10-CM | POA: Diagnosis present

## 2012-09-02 DIAGNOSIS — E785 Hyperlipidemia, unspecified: Secondary | ICD-10-CM | POA: Diagnosis present

## 2012-09-02 DIAGNOSIS — F17200 Nicotine dependence, unspecified, uncomplicated: Secondary | ICD-10-CM | POA: Diagnosis present

## 2012-09-02 DIAGNOSIS — I6529 Occlusion and stenosis of unspecified carotid artery: Principal | ICD-10-CM | POA: Diagnosis present

## 2012-09-02 DIAGNOSIS — Z87891 Personal history of nicotine dependence: Secondary | ICD-10-CM

## 2012-09-02 DIAGNOSIS — Z79899 Other long term (current) drug therapy: Secondary | ICD-10-CM

## 2012-09-02 DIAGNOSIS — I658 Occlusion and stenosis of other precerebral arteries: Secondary | ICD-10-CM | POA: Diagnosis present

## 2012-09-02 DIAGNOSIS — Z7982 Long term (current) use of aspirin: Secondary | ICD-10-CM

## 2012-09-02 DIAGNOSIS — I739 Peripheral vascular disease, unspecified: Secondary | ICD-10-CM | POA: Diagnosis present

## 2012-09-02 DIAGNOSIS — Z01812 Encounter for preprocedural laboratory examination: Secondary | ICD-10-CM

## 2012-09-02 DIAGNOSIS — Z01818 Encounter for other preprocedural examination: Secondary | ICD-10-CM

## 2012-09-02 DIAGNOSIS — E7800 Pure hypercholesterolemia, unspecified: Secondary | ICD-10-CM | POA: Diagnosis present

## 2012-09-02 DIAGNOSIS — E119 Type 2 diabetes mellitus without complications: Secondary | ICD-10-CM | POA: Diagnosis present

## 2012-09-02 HISTORY — PX: ENDARTERECTOMY: SHX5162

## 2012-09-02 HISTORY — PX: PATCH ANGIOPLASTY: SHX6230

## 2012-09-02 LAB — CBC
HCT: 36.9 % — ABNORMAL LOW (ref 39.0–52.0)
MCHC: 35 g/dL (ref 30.0–36.0)
MCV: 89.8 fL (ref 78.0–100.0)
Platelets: 142 10*3/uL — ABNORMAL LOW (ref 150–400)
RDW: 12.7 % (ref 11.5–15.5)
WBC: 11 10*3/uL — ABNORMAL HIGH (ref 4.0–10.5)

## 2012-09-02 LAB — GLUCOSE, CAPILLARY
Glucose-Capillary: 131 mg/dL — ABNORMAL HIGH (ref 70–99)
Glucose-Capillary: 177 mg/dL — ABNORMAL HIGH (ref 70–99)
Glucose-Capillary: 212 mg/dL — ABNORMAL HIGH (ref 70–99)

## 2012-09-02 LAB — CREATININE, SERUM
GFR calc Af Amer: >90 mL/min (ref >90)
GFR calc non Af Amer: >90 mL/min (ref >90)

## 2012-09-02 SURGERY — ENDARTERECTOMY, CAROTID
Anesthesia: General | Site: Neck | Laterality: Right | Wound class: Clean

## 2012-09-02 MED ORDER — ZOLPIDEM TARTRATE 5 MG PO TABS: 5.0000 mg | ORAL_TABLET | Freq: Every evening | ORAL | Status: DC | PRN

## 2012-09-02 MED ORDER — FLAXSEED OIL 1000 MG PO CAPS: 1.0000 | ORAL_CAPSULE | Freq: Every day | ORAL | Status: DC

## 2012-09-02 MED ORDER — OXYCODONE HCL 5 MG PO TABS: 5.0000 mg | ORAL_TABLET | Freq: Four times a day (QID) | ORAL | Status: DC | PRN

## 2012-09-02 MED ORDER — ACETAMINOPHEN 325 MG PO TABS: 325.0000 mg | ORAL_TABLET | ORAL | Status: DC | PRN

## 2012-09-02 MED ORDER — HYDROCHLOROTHIAZIDE 12.5 MG PO CAPS
12.5000 mg | ORAL_CAPSULE | Freq: Two times a day (BID) | ORAL | Status: DC
  Administered 2012-09-02: 12.5 mg via ORAL
  Filled 2012-09-02 (×3): qty 1

## 2012-09-02 MED ORDER — PANTOPRAZOLE SODIUM 40 MG PO TBEC
40.0000 mg | DELAYED_RELEASE_TABLET | Freq: Every day | ORAL | Status: DC
  Administered 2012-09-02: 40 mg via ORAL
  Filled 2012-09-02: qty 1

## 2012-09-02 MED ORDER — LIDOCAINE HCL 4 % MT SOLN
OROMUCOSAL | Status: DC | PRN
  Administered 2012-09-02: 4 mL via TOPICAL

## 2012-09-02 MED ORDER — DOPAMINE-DEXTROSE 3.2-5 MG/ML-% IV SOLN: 3.0000 ug/kg/min | INTRAVENOUS | Status: DC

## 2012-09-02 MED ORDER — DEXTROSE 5 % IV SOLN
1.5000 g | Freq: Two times a day (BID) | INTRAVENOUS | Status: AC
  Administered 2012-09-02 – 2012-09-03 (×2): 1.5 g via INTRAVENOUS
  Filled 2012-09-02 (×2): qty 1.5

## 2012-09-02 MED ORDER — CHLORHEXIDINE GLUCONATE CLOTH 2 % EX PADS
6.0000 | MEDICATED_PAD | Freq: Every day | CUTANEOUS | Status: DC
  Administered 2012-09-03: 6 via TOPICAL

## 2012-09-02 MED ORDER — HYDRALAZINE HCL 20 MG/ML IJ SOLN: 10.0000 mg | INTRAMUSCULAR | Status: DC | PRN

## 2012-09-02 MED ORDER — ZINC SULFATE 220 (50 ZN) MG PO CAPS
220.0000 mg | ORAL_CAPSULE | Freq: Every day | ORAL | Status: DC
  Filled 2012-09-02 (×2): qty 1

## 2012-09-02 MED ORDER — LISINOPRIL 20 MG PO TABS
20.0000 mg | ORAL_TABLET | Freq: Two times a day (BID) | ORAL | Status: DC
  Administered 2012-09-02: 20 mg via ORAL
  Filled 2012-09-02 (×3): qty 1

## 2012-09-02 MED ORDER — MAGNESIUM GLUCONATE 500 MG PO TABS
250.0000 mg | ORAL_TABLET | Freq: Every day | ORAL | Status: DC
  Filled 2012-09-02 (×2): qty 1

## 2012-09-02 MED ORDER — MORPHINE SULFATE 2 MG/ML IJ SOLN: 2.0000 mg | INTRAMUSCULAR | Status: DC | PRN

## 2012-09-02 MED ORDER — METOPROLOL TARTRATE 1 MG/ML IV SOLN: 2.0000 mg | INTRAVENOUS | Status: DC | PRN

## 2012-09-02 MED ORDER — SODIUM CHLORIDE 0.9 % IV SOLN: 500.0000 mL | Freq: Once | INTRAVENOUS | Status: AC | PRN

## 2012-09-02 MED ORDER — MAGNESIUM GLUCONATE 30 MG PO TABS: 30.0000 mg | ORAL_TABLET | ORAL | Status: DC

## 2012-09-02 MED ORDER — FLUTICASONE PROPIONATE 50 MCG/ACT NA SUSP
1.0000 | Freq: Every day | NASAL | Status: DC
  Filled 2012-09-02: qty 16

## 2012-09-02 MED ORDER — ACETAMINOPHEN 650 MG RE SUPP: 325.0000 mg | RECTAL | Status: DC | PRN

## 2012-09-02 MED ORDER — NIACIN 500 MG PO TABS
500.0000 mg | ORAL_TABLET | Freq: Every day | ORAL | Status: DC
  Filled 2012-09-02 (×2): qty 1

## 2012-09-02 MED ORDER — MUPIROCIN 2 % EX OINT
1.0000 "application " | TOPICAL_OINTMENT | Freq: Two times a day (BID) | CUTANEOUS | Status: DC
  Administered 2012-09-02: 1 via NASAL
  Filled 2012-09-02: qty 22

## 2012-09-02 MED ORDER — HYDROMORPHONE HCL PF 1 MG/ML IJ SOLN
0.2500 mg | INTRAMUSCULAR | Status: DC | PRN
  Administered 2012-09-02 (×2): 0.5 mg via INTRAVENOUS

## 2012-09-02 MED ORDER — SODIUM CHLORIDE 0.9 % IR SOLN
Status: DC | PRN
  Administered 2012-09-02: 14:00:00

## 2012-09-02 MED ORDER — OMEGA-3-ACID ETHYL ESTERS 1 G PO CAPS
1.0000 g | ORAL_CAPSULE | Freq: Every day | ORAL | Status: DC
  Filled 2012-09-02 (×2): qty 1

## 2012-09-02 MED ORDER — LIDOCAINE HCL (PF) 1 % IJ SOLN
INTRAMUSCULAR | Status: AC
  Filled 2012-09-02: qty 30

## 2012-09-02 MED ORDER — 0.9 % SODIUM CHLORIDE (POUR BTL) OPTIME
TOPICAL | Status: DC | PRN
  Administered 2012-09-02: 2000 mL

## 2012-09-02 MED ORDER — LACTATED RINGERS IV SOLN
INTRAVENOUS | Status: DC
  Administered 2012-09-02: 09:00:00 via INTRAVENOUS

## 2012-09-02 MED ORDER — ENOXAPARIN SODIUM 30 MG/0.3ML ~~LOC~~ SOLN
30.0000 mg | SUBCUTANEOUS | Status: DC
  Filled 2012-09-02: qty 0.3

## 2012-09-02 MED ORDER — ZINC GLUCONATE 50 MG PO TABS
50.0000 mg | ORAL_TABLET | Freq: Every day | ORAL | Status: DC
Start: 2012-09-02 — End: 2012-09-02

## 2012-09-02 MED ORDER — ASPIRIN EC 81 MG PO TBEC
81.0000 mg | DELAYED_RELEASE_TABLET | Freq: Every day | ORAL | Status: DC
  Administered 2012-09-02: 81 mg via ORAL
  Filled 2012-09-02 (×2): qty 1

## 2012-09-02 MED ORDER — ONDANSETRON HCL 4 MG/2ML IJ SOLN: 4.0000 mg | Freq: Once | INTRAMUSCULAR | Status: DC | PRN

## 2012-09-02 MED ORDER — NEOSTIGMINE METHYLSULFATE 1 MG/ML IJ SOLN
INTRAMUSCULAR | Status: DC | PRN
  Administered 2012-09-02: 5 mg via INTRAVENOUS

## 2012-09-02 MED ORDER — DILTIAZEM HCL ER BEADS 240 MG PO CP24
240.0000 mg | ORAL_CAPSULE | Freq: Every day | ORAL | Status: DC
  Administered 2012-09-02: 240 mg via ORAL
  Filled 2012-09-02 (×2): qty 1

## 2012-09-02 MED ORDER — HEPARIN SODIUM (PORCINE) 1000 UNIT/ML IJ SOLN
INTRAMUSCULAR | Status: DC | PRN
  Administered 2012-09-02: 1000 [IU] via INTRAVENOUS
  Administered 2012-09-02: 8000 [IU] via INTRAVENOUS

## 2012-09-02 MED ORDER — VITAMIN C 500 MG PO TABS
1000.0000 mg | ORAL_TABLET | Freq: Every day | ORAL | Status: DC
  Filled 2012-09-02 (×2): qty 2

## 2012-09-02 MED ORDER — SUFENTANIL CITRATE 50 MCG/ML IV SOLN
INTRAVENOUS | Status: DC | PRN
  Administered 2012-09-02: 10 ug via INTRAVENOUS
  Administered 2012-09-02: 20 ug via INTRAVENOUS
  Administered 2012-09-02: 5 ug via INTRAVENOUS

## 2012-09-02 MED ORDER — FLUTICASONE FUROATE 27.5 MCG/SPRAY NA SUSP: 1.0000 | Freq: Every day | NASAL | Status: DC

## 2012-09-02 MED ORDER — POTASSIUM CHLORIDE CRYS ER 20 MEQ PO TBCR: 20.0000 meq | EXTENDED_RELEASE_TABLET | Freq: Once | ORAL | Status: AC | PRN

## 2012-09-02 MED ORDER — METFORMIN HCL 500 MG PO TABS
1000.0000 mg | ORAL_TABLET | Freq: Two times a day (BID) | ORAL | Status: DC
Start: 2012-09-03 — End: 2012-09-03
  Administered 2012-09-03: 1000 mg via ORAL
  Filled 2012-09-02 (×3): qty 2

## 2012-09-02 MED ORDER — HYDRALAZINE HCL 20 MG/ML IJ SOLN
INTRAMUSCULAR | Status: DC | PRN
  Administered 2012-09-02 (×2): 6 mg via INTRAVENOUS
  Administered 2012-09-02: 8 mg via INTRAVENOUS

## 2012-09-02 MED ORDER — ALUM & MAG HYDROXIDE-SIMETH 200-200-20 MG/5ML PO SUSP: 15.0000 mL | ORAL | Status: DC | PRN

## 2012-09-02 MED ORDER — DOCUSATE SODIUM 100 MG PO CAPS: 100.0000 mg | ORAL_CAPSULE | Freq: Every day | ORAL | Status: DC

## 2012-09-02 MED ORDER — VITAMIN D3 25 MCG (1000 UNIT) PO TABS
1000.0000 [IU] | ORAL_TABLET | Freq: Every day | ORAL | Status: DC
  Filled 2012-09-02 (×2): qty 1

## 2012-09-02 MED ORDER — ROCURONIUM BROMIDE 100 MG/10ML IV SOLN
INTRAVENOUS | Status: DC | PRN
  Administered 2012-09-02: 50 mg via INTRAVENOUS
  Administered 2012-09-02 (×2): 20 mg via INTRAVENOUS

## 2012-09-02 MED ORDER — PHENYLEPHRINE HCL 10 MG/ML IJ SOLN
10.0000 mg | INTRAVENOUS | Status: DC | PRN
  Administered 2012-09-02: 40 ug/min via INTRAVENOUS

## 2012-09-02 MED ORDER — TERAZOSIN HCL 5 MG PO CAPS
10.0000 mg | ORAL_CAPSULE | Freq: Every day | ORAL | Status: DC
Start: 2012-09-02 — End: 2012-09-03
  Administered 2012-09-02: 10 mg via ORAL
  Filled 2012-09-02 (×2): qty 2

## 2012-09-02 MED ORDER — PROTAMINE SULFATE 10 MG/ML IV SOLN
INTRAVENOUS | Status: DC | PRN
  Administered 2012-09-02: 50 mg via INTRAVENOUS

## 2012-09-02 MED ORDER — INSULIN ASPART 100 UNIT/ML ~~LOC~~ SOLN: 0.0000 [IU] | Freq: Three times a day (TID) | SUBCUTANEOUS | Status: DC

## 2012-09-02 MED ORDER — QUINAPRIL-HYDROCHLOROTHIAZIDE 20-12.5 MG PO TABS: 1.0000 | ORAL_TABLET | Freq: Two times a day (BID) | ORAL | Status: DC

## 2012-09-02 MED ORDER — ATORVASTATIN CALCIUM 80 MG PO TABS
80.0000 mg | ORAL_TABLET | Freq: Every day | ORAL | Status: DC
  Administered 2012-09-02: 80 mg via ORAL
  Filled 2012-09-02 (×2): qty 1

## 2012-09-02 MED ORDER — HYDROMORPHONE HCL PF 1 MG/ML IJ SOLN
INTRAMUSCULAR | Status: AC
  Filled 2012-09-02: qty 1

## 2012-09-02 MED ORDER — HEMOSTATIC AGENTS (NO CHARGE) OPTIME
TOPICAL | Status: DC | PRN
  Administered 2012-09-02: 1 via TOPICAL

## 2012-09-02 MED ORDER — ONDANSETRON HCL 4 MG/2ML IJ SOLN: 4.0000 mg | Freq: Four times a day (QID) | INTRAMUSCULAR | Status: DC | PRN

## 2012-09-02 MED ORDER — PHENOL 1.4 % MT LIQD
1.0000 | OROMUCOSAL | Status: DC | PRN
  Administered 2012-09-03: 1 via OROMUCOSAL
  Filled 2012-09-02: qty 177

## 2012-09-02 MED ORDER — OXYCODONE HCL 5 MG PO TABS: 5.0000 mg | ORAL_TABLET | ORAL | Status: DC | PRN

## 2012-09-02 MED ORDER — LABETALOL HCL 5 MG/ML IV SOLN
INTRAVENOUS | Status: DC | PRN
  Administered 2012-09-02 (×2): 10 mg via INTRAVENOUS

## 2012-09-02 MED ORDER — THROMBIN 20000 UNITS EX SOLR
CUTANEOUS | Status: AC
  Filled 2012-09-02: qty 20000

## 2012-09-02 MED ORDER — SODIUM CHLORIDE 0.9 % IV SOLN
INTRAVENOUS | Status: DC
  Administered 2012-09-02: 1000 mL via INTRAVENOUS

## 2012-09-02 MED ORDER — LACTATED RINGERS IV SOLN
INTRAVENOUS | Status: DC | PRN
  Administered 2012-09-02 (×3): via INTRAVENOUS

## 2012-09-02 MED ORDER — GUAIFENESIN-DM 100-10 MG/5ML PO SYRP: 15.0000 mL | ORAL_SOLUTION | ORAL | Status: DC | PRN

## 2012-09-02 MED ORDER — PROPOFOL 10 MG/ML IV BOLUS
INTRAVENOUS | Status: DC | PRN
  Administered 2012-09-02: 130 mg via INTRAVENOUS

## 2012-09-02 MED ORDER — MIDAZOLAM HCL 5 MG/5ML IJ SOLN
INTRAMUSCULAR | Status: DC | PRN
  Administered 2012-09-02: 2 mg via INTRAVENOUS

## 2012-09-02 MED ORDER — B COMPLEX-C PO TABS
1.0000 | ORAL_TABLET | Freq: Every day | ORAL | Status: DC
  Filled 2012-09-02 (×2): qty 1

## 2012-09-02 MED ORDER — OMEGA-3 FATTY ACIDS 1000 MG PO CAPS: 1.0000 g | ORAL_CAPSULE | Freq: Every day | ORAL | Status: DC

## 2012-09-02 MED ORDER — GLIPIZIDE 10 MG PO TABS
10.0000 mg | ORAL_TABLET | Freq: Two times a day (BID) | ORAL | Status: DC
  Administered 2012-09-02 – 2012-09-03 (×2): 10 mg via ORAL
  Filled 2012-09-02 (×3): qty 1

## 2012-09-02 MED ORDER — LABETALOL HCL 5 MG/ML IV SOLN: 10.0000 mg | INTRAVENOUS | Status: DC | PRN

## 2012-09-02 MED ORDER — B COMPLEX PO TABS
1.0000 | ORAL_TABLET | Freq: Every day | ORAL | Status: DC
Start: 2012-09-02 — End: 2012-09-02

## 2012-09-02 MED ORDER — GLYCOPYRROLATE 0.2 MG/ML IJ SOLN
INTRAMUSCULAR | Status: DC | PRN
  Administered 2012-09-02: 0.2 mg via INTRAVENOUS
  Administered 2012-09-02: .6 mg via INTRAVENOUS

## 2012-09-02 SURGICAL SUPPLY — 51 items
CANISTER SUCTION 2500CC (MISCELLANEOUS) ×3 IMPLANT
CATH ROBINSON RED A/P 18FR (CATHETERS) ×3 IMPLANT
CATH SUCT 10FR WHISTLE TIP (CATHETERS) ×3 IMPLANT
CLIP TI MEDIUM 24 (CLIP) ×3 IMPLANT
CLIP TI WIDE RED SMALL 24 (CLIP) ×3 IMPLANT
CLOTH BEACON ORANGE TIMEOUT ST (SAFETY) ×3 IMPLANT
COVER SURGICAL LIGHT HANDLE (MISCELLANEOUS) ×3 IMPLANT
CRADLE DONUT ADULT HEAD (MISCELLANEOUS) ×3 IMPLANT
DERMABOND ADVANCED (GAUZE/BANDAGES/DRESSINGS) ×1
DERMABOND ADVANCED .7 DNX12 (GAUZE/BANDAGES/DRESSINGS) ×2 IMPLANT
DRAIN CHANNEL 15F RND FF W/TCR (WOUND CARE) IMPLANT
DRAPE WARM FLUID 44X44 (DRAPE) ×3 IMPLANT
ELECT REM PT RETURN 9FT ADLT (ELECTROSURGICAL) ×3
ELECTRODE REM PT RTRN 9FT ADLT (ELECTROSURGICAL) ×2 IMPLANT
EVACUATOR SILICONE 100CC (DRAIN) IMPLANT
GLOVE BIO SURGEON STRL SZ 6.5 (GLOVE) ×6 IMPLANT
GLOVE BIOGEL PI IND STRL 6.5 (GLOVE) ×2 IMPLANT
GLOVE BIOGEL PI IND STRL 7.0 (GLOVE) ×2 IMPLANT
GLOVE BIOGEL PI IND STRL 7.5 (GLOVE) ×4 IMPLANT
GLOVE BIOGEL PI INDICATOR 6.5 (GLOVE) ×1
GLOVE BIOGEL PI INDICATOR 7.0 (GLOVE) ×1
GLOVE BIOGEL PI INDICATOR 7.5 (GLOVE) ×2
GLOVE ECLIPSE 6.5 STRL STRAW (GLOVE) ×3 IMPLANT
GLOVE SS BIOGEL STRL SZ 7.5 (GLOVE) ×2 IMPLANT
GLOVE SUPERSENSE BIOGEL SZ 7.5 (GLOVE) ×1
GLOVE SURG SS PI 7.5 STRL IVOR (GLOVE) ×3 IMPLANT
GOWN PREVENTION PLUS XXLARGE (GOWN DISPOSABLE) IMPLANT
GOWN STRL NON-REIN LRG LVL3 (GOWN DISPOSABLE) IMPLANT
HEMOSTAT SNOW SURGICEL 2X4 (HEMOSTASIS) IMPLANT
INSERT FOGARTY SM (MISCELLANEOUS) IMPLANT
KIT BASIN OR (CUSTOM PROCEDURE TRAY) ×3 IMPLANT
KIT ROOM TURNOVER OR (KITS) ×3 IMPLANT
NS IRRIG 1000ML POUR BTL (IV SOLUTION) ×6 IMPLANT
PACK CAROTID (CUSTOM PROCEDURE TRAY) ×3 IMPLANT
PAD ARMBOARD 7.5X6 YLW CONV (MISCELLANEOUS) ×6 IMPLANT
PATCH VASCULAR VASCU GUARD 1X6 (Vascular Products) ×3 IMPLANT
SHUNT CAROTID BYPASS 10 (VASCULAR PRODUCTS) IMPLANT
SHUNT CAROTID BYPASS 12FRX15.5 (VASCULAR PRODUCTS) IMPLANT
SPONGE INTESTINAL PEANUT (DISPOSABLE) ×3 IMPLANT
SUT ETHILON 3 0 PS 1 (SUTURE) IMPLANT
SUT PROLENE 6 0 BV (SUTURE) ×15 IMPLANT
SUT PROLENE 7 0 BV 1 (SUTURE) IMPLANT
SUT PROLENE 7 0 BV1 MDA (SUTURE) ×3 IMPLANT
SUT SILK 3 0 TIES 17X18 (SUTURE)
SUT SILK 3-0 18XBRD TIE BLK (SUTURE) IMPLANT
SUT VIC AB 3-0 SH 27 (SUTURE) ×2
SUT VIC AB 3-0 SH 27X BRD (SUTURE) ×4 IMPLANT
SUT VICRYL 4-0 PS2 18IN ABS (SUTURE) ×3 IMPLANT
TOWEL OR 17X24 6PK STRL BLUE (TOWEL DISPOSABLE) ×3 IMPLANT
TOWEL OR 17X26 10 PK STRL BLUE (TOWEL DISPOSABLE) ×3 IMPLANT
WATER STERILE IRR 1000ML POUR (IV SOLUTION) ×3 IMPLANT

## 2012-09-02 NOTE — Transfer of Care (Signed)
Immediate Anesthesia Transfer of Care Note  Patient: William Burke  Procedure(s) Performed: Procedure(s): ENDARTERECTOMY CAROTID with Resection of Common Carotid Artery (Right) PATCH ANGIOPLASTY using Vascu-Guard Vascular Patch (Right)  Patient Location: PACU  Anesthesia Type:General  Level of Consciousness: awake, alert , oriented and patient cooperative  Airway & Oxygen Therapy: Patient Spontanous Breathing and Patient connected to nasal cannula oxygen  Post-op Assessment: Report given to PACU RN, Post -op Vital signs reviewed and stable, Patient moving all extremities, Patient moving all extremities X 4 and Patient able to stick tongue midline  Post vital signs: Reviewed and stable  Complications: No apparent anesthesia complications

## 2012-09-02 NOTE — Interval H&P Note (Signed)
History and Physical Interval Note:  09/02/2012 12:50 PM  William Burke  has presented today for surgery, with the diagnosis of Right Internal Carotid Artery Stenosis  The various methods of treatment have been discussed with the patient and family. After consideration of risks, benefits and other options for treatment, the patient has consented to  Procedure(s): ENDARTERECTOMY CAROTID (Right) as a surgical intervention .  The patient's history has been reviewed, patient examined, no change in status, stable for surgery.  I have reviewed the patient's chart and labs.  Questions were answered to the patient's satisfaction.     Sheniece Ruggles IV, V. WELLS

## 2012-09-02 NOTE — Anesthesia Preprocedure Evaluation (Addendum)
Anesthesia Evaluation  Patient identified by MRN, date of birth, ID band Patient awake    Reviewed: Allergy & Precautions, H&P , NPO status , Patient's Chart, lab work & pertinent test results  Airway Mallampati: II      Dental  (+) Partial Upper and Dental Advisory Given   Pulmonary  breath sounds clear to auscultation        Cardiovascular Rhythm:Regular Rate:Normal     Neuro/Psych    GI/Hepatic   Endo/Other    Renal/GU      Musculoskeletal   Abdominal   Peds  Hematology   Anesthesia Other Findings   Reproductive/Obstetrics                           Anesthesia Physical Anesthesia Plan  ASA: III  Anesthesia Plan: General   Post-op Pain Management:    Induction: Intravenous  Airway Management Planned:   Additional Equipment:   Intra-op Plan:   Post-operative Plan: Extubation in OR  Informed Consent:   Dental advisory given  Plan Discussed with: CRNA and Anesthesiologist  Anesthesia Plan Comments: (Asymptomatic R. Carotid stenosis Type 2 DM glucose 131 htn PVD S/P iliac stents 2009 (-) nuclear stress test 08/19/12  Plan GA with art line  Kipp Brood, MD)       Anesthesia Quick Evaluation

## 2012-09-02 NOTE — Consult Note (Signed)
ANTIBIOTIC CONSULT NOTE - INITIAL  Pharmacy Consult for : Zinacef Indication: Post op antibiotic prophylaxsis, s/p R-CEA  Allergies Plavix  CrCl: Estimated Creatinine Clearance: 85.2 ml/min (by C-G formula based on Cr of 0.65).  Antibiotics:   CefUROXime (ZINACEF)  IV 1.5 g Intravenous Q12H x 2 doses   Assessment:  72 y/o male s/p R-CEA ordered Zinacef post-op x 2 doses.  CrCl 88 ml/min.  No dose adjustment required.  Goal of Therapy:   Renal Adjustment of Antibiotics  Plan:   Continue Zinacef 1.5 gm IV q 12 hours x 2, then discontinue  Pharmacy will sign off at this time, please call for further questions  Laurena Bering,  Pharm.D.  09/02/2012 7:43 PM

## 2012-09-02 NOTE — Progress Notes (Signed)
PHARMACIST - PHYSICIAN ORDER COMMUNICATION  CONCERNING: P&T Medication Policy on Herbal Medications  DESCRIPTION:  This patient's order for:  flaxseed has been noted.  This product(s) is classified as an "herbal" or natural product. Due to a lack of definitive safety studies or FDA approval, nonstandard manufacturing practices, plus the potential risk of unknown drug-drug interactions while on inpatient medications, the Pharmacy and Therapeutics Committee does not permit the use of "herbal" or natural products of this type within [facility].   ACTION TAKEN: The pharmacy department is unable to verify this order at this time and your patient has been informed of this safety policy. Please reevaluate patient's clinical condition at discharge and address if the herbal or natural product(s) should be resumed at that time.

## 2012-09-02 NOTE — Op Note (Addendum)
[facility] [place]  Patient name: William Burke MRN: [id number] DOB: August 03, 1940 Sex: male  09/02/2012 Pre-operative Diagnosis: Sx   right carotid stenosis Post-operative diagnosis:  Same Surgeon:  Jorge Ny Assistants:  S. Rhyne, M. Collins Procedure:   1.) right carotid Endarterectomy with bovine pericardial patch angioplasty   2.)  Resection and primary anastamosis right common carotid artery Anesthesia:  General Blood Loss:  See anesthesia record Specimens:  Carotid Plaque to pathology  Findings:  90 %stenosis; Thrombus:  none  Indications:  The patient has been having atypical sensations on both sides of his face more prominent over the last couple months. Duplex ultrasound revealed greater than 70% stenosis with severely elevated velocities. He comes in today for endarterectomy  Procedure:  The patient was identified in the holding area and taken to [facility] OR ROOM 11  The patient was then placed supine on the table.   General endotrachial anesthesia was administered.  The patient was prepped and draped in the usual sterile fashion.  A time out was called and antibiotics were administered.  The incision was made along the anterior border of the right sternocleidomastoid muscle.  Cautery was used to dissect through the subcutaneous tissue.  The platysma muscle was divided with cautery.  The internal jugular vein was exposed along its anterior medial border.  The common facial vein was exposed and then divided between 2-0 silk ties and metal clips.  The common carotid artery was then circumferentially exposed and encircled with an umbilical tape.  The vagus nerve was identified and protected.  Next sharp dissection was used to expose the external carotid artery and the superior thyroid artery.  The were encircled with a blue vessel loop and a 2-0 silk tie respectively.  Finally, the internal carotid was carefully dissected free.  An umbilical tape was placed around  the internal carotid artery distal to the diseased segment.  The hypoglossal nerve was visualized throughout and protected.  The disease within the internal carotid artery and extended above the hypoglossal nerve which had to be gently retracted in order to expose the distal extent of the disease. In addition the disease within the common carotid artery extended down to the clavicle. The patient was given systemic heparinization.  A bovine carotid patch was selected and prepared on the back table.  A 10 french shunt was also prepared.  After blood pressure readings were appropriate and the heparin had been given time to circulate, the internal carotid artery was occluded with a baby Gregory clamp.  The external and common carotid arteries were then occluded with vascular clamps and the 2-0 tie tightened on the superior thyroid artery.  A #11 blade was used to make an arteriotomy in the common carotid artery.  This was extended with Potts scissors along the anterior and lateral border of the common and internal carotid artery.  Approximately 95% stenosis was identified.  There was no thrombus identified.  I elected not to place a shunt as the lesion was too high, in addition there was residual disease that could not be removed within the proximal common carotid artery. The patient did have excellent backbleeding. A kleiner kuntz elevator was used to perform endarterectomy.  An eversion endarterectomy was performed in the external carotid artery.  A good distal endpoint was obtained in the internal carotid artery.  I placed 370 tacking sutures at the distal endpoint. The specimen was removed and sent to pathology.  Heparinized saline was used to  irrigate the endarterectomized field.  All potential embolic debris was removed. There was a significant redundancy within the carotid artery after endarterectomy. I therefore elected to resect approximately 2 cm of the common carotid artery. 4 6-0 Prolene sutures were placed  at 4 corners and then 2 cm of the carotid artery was resected. The resected ends were then sewn back together with a 6-0 Prolene in continuous fashion. Bovine pericardial patch angioplasty was then performed using a running 6-0 Prolene. The common internal and external carotid arteries were all appropriately flushed. The artery was again irrigated with heparin saline.  The anastomosis was then secured. The clamp was first released on the external carotid artery followed by the common carotid artery approximately 30 seconds later, bloodflow was reestablish through the internal carotid artery.  Next, a hand-held Doppler was used to evaluate the signals in the common, external, and internal carotid arteries, all of which had appropriate signals. I then administered  50 mg protamine. The wound was then irrigated.  After hemostasis was achieved, the carotid sheath was reapproximated with 3-0 Vicryl. The platysma muscle was reapproximated with running 3-0 Vicryl. The skin  was closed with 4-0 Vicryl. Dermabond was placed on the skin. The patient was then successfully extubated. His neurologic exam was similar to his preprocedural exam. The patient was then taken to recovery room  in stable condition. There were no complications.     Disposition:  To PACU in stable condition.  Relevant Operative Details:  The lesion extended distally up beyond the hypoglossal nerve. In addition there was residual disease and plaque within the proximal common carotid artery that could not be removed at the time of his endarterectomy. No shunt was utilized because of residual disease and the high location of the distal endpoint. In addition, the patient had excellent backbleeding.  Juleen China, M.D. [facility] [place] Office: [phone] Pager:  [phone]

## 2012-09-02 NOTE — H&P (View-Only) (Signed)
[facility] [place]   Patient name: William Burke MRN: [id number] DOB: 1940/12/18 Sex: male   Referred by: Dr. Allyson Sabal  Reason for referral:  Chief Complaint  Patient presents with  . New Evaluation    Carotid stenosis Ref. by Dr. Erlene Quan 70-99%  C/o face tingling off/on 6 months.    HISTORY OF PRESENT ILLNESS: This is a very pleasant 72 year old gentleman that is referred for evaluation of right carotid stenosis which is possibly symptomatic. The patient reports for the past 6 months that he has tingling on the right side of his face. It happens several times a week. Occasionally it will be on his left side. He reports no evidence of amaurosis fugax. He denies slurred speech. He denies numbness or weakness in either extremity.  Patient has a history of peripheral vascular disease. He is undergone lower extremity stenting for claudication. He suffers from hypercholesterolemia and is currently taking a statin. He is also medically managed for his hypertension. The patient is a type II diabetic. He is not on insulin. His blood sugars are in the 1:30 to 150 range. He is on single antiplatelet therapy with a baby aspirin. He is intolerant to Plavix secondary to nosebleeds.  Past Medical History  Diagnosis Date  . PAD (peripheral artery disease) 06/22/2007    Bilateral iliac artery PTA and stenting with iCAST. Last lower extremity arterial Dopplers 02/25/2011 right ABI 1.0 left ABI 1.1.  Bilateral carotid artery stenosis. Last carotid Dopplers 07/18/2011: Max ICA stenosis 50-69% bilaterally  . Diabetes mellitus, type 2   . Hypertension   . Hyperlipidemia   . History of tobacco abuse   . Carotid artery disease     Past Surgical History  Procedure Laterality Date  . Bowl surgery  1989    History   Social History  . Marital Status: Married    Spouse Name: N/A    Number of Children: N/A  . Years of Education: N/A   Occupational History  . Not on file.    Social History Main Topics  . Smoking status: Former Smoker -- 3.00 packs/day for 25 years    Types: Cigarettes    Quit date: 08/06/1987  . Smokeless tobacco: Never Used  . Alcohol Use: No  . Drug Use: No  . Sexually Active: Not on file   Other Topics Concern  . Not on file   Social History Narrative  . No narrative on file    Family History  Problem Relation Age of Onset  . Diabetes Mother   . Hypertension Mother   . Cerebral aneurysm Mother   . Multiple sclerosis Father   . Kidney disease Father     Allergies as of 08/23/2012 - Review Complete 08/23/2012  Allergen Reaction Noted  . Plavix (clopidogrel bisulfate)  08/05/2012    [facility] Outpatient Prescriptions on File Prior to Visit  Medication Sig Dispense Refill  . aspirin 81 MG tablet Take 81 mg by mouth daily.      [name] atorvastatin (LIPITOR) 80 MG tablet Take 80 mg by mouth daily.      [name] CINNAMON PO Take 1,000 mg by mouth daily.      [name] diltiazem (TIAZAC) 240 MG 24 hr capsule Take 240 mg by mouth 2 (two) times daily.      . fish oil-omega-3 fatty acids 1000 MG capsule Take 1 g by mouth daily.      . Flaxseed, Linseed, (FLAXSEED OIL) 1000 MG CAPS Take 1 capsule by  mouth daily.      . fluticasone (VERAMYST) 27.5 MCG/SPRAY nasal spray Place 1 spray into the nose daily.      . Garlic 1000 MG CAPS Take 2 capsules by mouth daily.      [name] glipiZIDE (GLUCOTROL) 10 MG tablet Take 1 tablet by mouth 2 (two) times daily.      . metFORMIN (GLUCOPHAGE) 1000 MG tablet Take 1,000 mg by mouth 2 (two) times daily with a meal.      . niacin 500 MG tablet Take 500 mg by mouth daily with breakfast.      . POTASSIUM GLUCONATE PO Take 99 mg by mouth daily.      . quinapril-hydrochlorothiazide (ACCURETIC) 20-12.5 MG per tablet Take 1 tablet by mouth 2 (two) times daily.      [name] terazosin (HYTRIN) 10 MG capsule Take 10 mg by mouth at bedtime.      . vitamin C (ASCORBIC ACID) 500 MG tablet Take 1,000 mg by mouth daily.      [name] zinc gluconate  50 MG tablet Take 50 mg by mouth daily.       No Burke facility-administered medications on file prior to visit.     REVIEW OF SYSTEMS: Cardiovascular: No chest pain, chest pressure, palpitations, orthopnea, or dyspnea on exertion. No claudication or rest pain,  No history of DVT or phlebitis. Pulmonary: No productive cough, asthma or wheezing. Neurologic: No weakness, paresthesias, aphasia, or amaurosis. No dizziness. Hematologic: No bleeding problems or clotting disorders. Musculoskeletal: No joint pain or joint swelling. Gastrointestinal: No blood in stool or hematemesis Genitourinary: No dysuria or hematuria. Psychiatric:: No history of major depression. Integumentary: No rashes or ulcers. Constitutional: No fever or chills.  PHYSICAL EXAMINATION: General: The patient appears their stated age.  Vital signs are BP 137/66  Pulse 71  Resp 16  Ht 5\' 5"  (1.651 m)  Wt 196 lb (88.905 kg)  BMI 32.62 kg/m2  SpO2 97% HEENT:  No gross abnormalities Pulmonary: Respirations are non-labored Musculoskeletal: There are no major deformities.   Neurologic: No focal weakness or paresthesias are detected, Skin: There are no ulcer or rashes noted. Psychiatric: The patient has normal affect. Cardiovascular: There is a regular rate and rhythm without significant murmur appreciated. No carotid bruits  Diagnostic Studies: Outside carotid ultrasound shows greater than 70% right carotid stenosis and 50-69% left carotid stenosis. Ultrasound in our office confirms these results with a mid hyoid bifurcation.    Assessment:  Questionable symptomatic right carotid stenosis Plan: The patient will be scheduled for right carotid endarterectomy. I discussed the risks and benefits of the operation, including the risk of stroke, the risk of nerve injury, the risk of cardiopulmonary complications, and the risk of bleeding. He is scheduled to have a stress test this Thursday. For that reason, I have  scheduled his operation for Thursday, August 7     V. Charlena Cross, M.D. [facility] [place] Office: [phone] Pager:  [phone]

## 2012-09-02 NOTE — Anesthesia Postprocedure Evaluation (Signed)
  Anesthesia Post-op Note  Patient: William Burke  Procedure(s) Performed: Procedure(s): ENDARTERECTOMY CAROTID with Resection of Common Carotid Artery (Right) PATCH ANGIOPLASTY using Vascu-Guard Vascular Patch (Right)  Patient Location: PACU  Anesthesia Type:General  Level of Consciousness: awake, alert  and oriented  Airway and Oxygen Therapy: Patient Spontanous Breathing and Patient connected to nasal cannula oxygen  Post-op Pain: mild  Post-op Assessment: Post-op Vital signs reviewed, Patient's Cardiovascular Status Stable, Respiratory Function Stable, Patent Airway and Pain level controlled  Post-op Vital Signs: stable  Complications: No apparent anesthesia complications

## 2012-09-02 NOTE — Anesthesia Procedure Notes (Signed)
Procedure Name: Intubation Date/Time: 09/02/2012 1:49 PM Performed by: Coralee Rud Pre-anesthesia Checklist: Patient identified, Emergency Drugs available, Suction available and Patient being monitored Patient Re-evaluated:Patient Re-evaluated prior to inductionOxygen Delivery Method: Circle system utilized Intubation Type: IV induction Ventilation: Mask ventilation without difficulty Laryngoscope Size: Miller and 3 Grade View: Grade II Tube type: Oral Tube size: 8.0 mm Number of attempts: 2 Airway Equipment and Method: Bougie stylet and LTA kit utilized (Easy visualization but Bougie required to get ETT to vocal cords due to Rt. back molars) Placement Confirmation: ETT inserted through vocal cords under direct vision,  positive ETCO2 and breath sounds checked- equal and bilateral Secured at: 22 cm Tube secured with: Tape Dental Injury: Teeth and Oropharynx as per pre-operative assessment

## 2012-09-03 ENCOUNTER — Telehealth: Payer: Self-pay | Admitting: Surgery

## 2012-09-03 LAB — CBC
HCT: 35.5 % — ABNORMAL LOW (ref 39.0–52.0)
MCH: 31.1 pg (ref 26.0–34.0)
MCV: 89 fL (ref 78.0–100.0)
Platelets: 150 10*3/uL (ref 150–400)
RDW: 12.5 % (ref 11.5–15.5)

## 2012-09-03 LAB — GLUCOSE, CAPILLARY
Glucose-Capillary: 84 mg/dL (ref 70–99)
Glucose-Capillary: 154 mg/dL — ABNORMAL HIGH (ref 70–99)

## 2012-09-03 LAB — BASIC METABOLIC PANEL
BUN: 14 mg/dL (ref 6–23)
Calcium: 8.9 mg/dL (ref 8.4–10.5)
Creatinine, Ser: 0.56 mg/dL (ref 0.50–1.35)
GFR calc Af Amer: >90 mL/min (ref >90)

## 2012-09-03 MED ORDER — OXYCODONE HCL 5 MG PO TABS: 5.0000 mg | ORAL_TABLET | Freq: Four times a day (QID) | ORAL | Status: DC | PRN

## 2012-09-03 NOTE — Progress Notes (Signed)
Patient has been discharged home. IV has been D/C. Reviewed discharge instructions with the patient and his wife. All questions and concerns have been addressed.

## 2012-09-03 NOTE — Telephone Encounter (Signed)
Message copied by Margaretmary Eddy on Fri Sep 03, 2012 11:06 AM ------      Message from: Sharee Pimple      Created: Fri Sep 03, 2012  8:57 AM      Regarding: schedule                   ----- Message -----         From: Dara Lords, PA-C         Sent: 09/02/2012   4:31 PM           To: Sharee Pimple, CMA            S/p right CEA 09/02/12.  F/u with Dr. Myra Gianotti in 2 weeks.            Thanks,      Lelon Mast ------

## 2012-09-03 NOTE — Discharge Summary (Signed)
Physician Discharge Summary   Patient ID: William Burke MRN: [id number] DOB/AGE: [birth year] 73 y.o.   Admit date: 09/02/2012 Discharge date: 09/03/2012   Admission Diagnosis: Carotid stenosis Ref. by Dr. Erlene Quan 70-99% C/o face tingling off/on 6 months   Discharge Diagnoses:  Carotid stenosis Ref. by Dr. Erlene Quan 70-99% C/o face tingling off/on 6 months   Secondary Diagnoses: Past Medical History  Diagnosis Date  . PAD (peripheral artery disease) 06/22/2007    Bilateral iliac artery PTA and stenting with iCAST. Last lower extremity arterial Dopplers 02/25/2011 right ABI 1.0 left ABI 1.1.  Bilateral carotid artery stenosis. Last carotid Dopplers 07/18/2011: Max ICA stenosis 50-69% bilaterally  . Diabetes mellitus, type 2   . Hypertension   . Hyperlipidemia   . History of tobacco abuse   . Carotid artery disease   . Complication of anesthesia     sensitive to anesthesia ,longer to awaken  . UTI (lower urinary tract infection)   . GERD (gastroesophageal reflux disease)     occ     Procedures: 1.  right carotid endarterectomy with bovine patch angioplastyResection and primary anastamosis right common carotid artery  (09/02/2012)   Discharged Condition: good   Hospital Course:  William Burke is a 72 y.o. male with right symptomatic internal carotid stenosis >80%.  The patient underwent right carotid endarterectomy with  bovine patch angioplasty.  The patient post-operative course was:  unremarkable with intact neurologic function and stable hemodynamics.  The patient's incision had:  no hematoma.  The patient was  tolerating food by mouth on discharge.  The patient will follow up with Dr. Myra Gianotti in 2 weeks for wound check.   Consults:     Significant Diagnostic Studies: CBC    Component Value Date/Time   WBC 9.4 09/03/2012 0447   RBC 3.99* 09/03/2012 0447   HGB 12.4* 09/03/2012 0447   HCT 35.5* 09/03/2012 0447   PLT 150 09/03/2012 0447   MCV 89.0 09/03/2012 0447   MCH 31.1  09/03/2012 0447   MCHC 34.9 09/03/2012 0447   RDW 12.5 09/03/2012 0447    BMET    Component Value Date/Time   NA 131* 09/03/2012 0447   K 3.8 09/03/2012 0447   CL 92* 09/03/2012 0447   CO2 26 09/03/2012 0447   GLUCOSE 178* 09/03/2012 0447   BUN 14 09/03/2012 0447   CREATININE 0.56 09/03/2012 0447   CALCIUM 8.9 09/03/2012 0447   GFRNONAA >90 09/03/2012 0447   GFRAA >90 09/03/2012 0447    COAG Lab Results  Component Value Date   INR 1.01 08/30/2012   No results found for this basename: PTT     Disposition: 01-Home or Self Care  Discharge Orders   Future Appointments Provider Department Dept Phone   09/20/2012 8:30 AM Nada Libman, MD [facility] [phone]   Future Orders Complete By Expires     CAROTID Sugery: Call MD for difficulty swallowing or speaking; weakness in arms or legs that is a new symtom; severe headache.  If you have increased swelling in the neck and/or  are having difficulty breathing, CALL 911  As directed     Call MD for:  redness, tenderness, or signs of infection (pain, swelling, bleeding, redness, odor or green/yellow discharge around incision site)  As directed     Call MD for:  severe or increased pain, loss or decreased feeling  in affected limb(s)  As directed     Call MD for:  temperature >100.5  As directed     Discharge patient  As directed     Comments:      Discharge pt to home    Discharge wound care:  As directed     Comments:      Shower daily with soap and water starting 09/04/12    Driving Restrictions  As directed     Comments:      No driving for 2 weeks    Lifting restrictions  As directed     Comments:      No lifting for 2 weeks    Nursing communication  As directed     Scheduling Instructions:      Please give paper Rx to patient at discharge.  It is located on the paper chart.    Resume previous diet  As directed          Medication List         aspirin EC 81 MG tablet  Take 81 mg by mouth daily.      atorvastatin 80 MG tablet  Commonly known as:  LIPITOR  Take 80 mg by mouth daily.     b complex vitamins tablet  Take 1 tablet by mouth daily.     cholecalciferol 1000 UNITS tablet  Commonly known as:  VITAMIN D  Take 1,000 Units by mouth daily.     Chromium Picolinate 500 MCG Caps  Take 500 mg by mouth 1 day or 1 dose.     CINNAMON PO  Take 1,000 mg by mouth daily.     diltiazem 240 MG 24 hr capsule  Commonly known as:  TIAZAC  Take 240 mg by mouth daily.     fish oil-omega-3 fatty acids 1000 MG capsule  Take 1 g by mouth daily.     Flaxseed Oil 1000 MG Caps  Take 1 capsule by mouth daily.     fluticasone 27.5 MCG/SPRAY nasal spray  Commonly known as:  VERAMYST  Place 1 spray into the nose daily.     Garlic 1000 MG Caps  Take 2 capsules by mouth daily.     glipiZIDE 10 MG tablet  Commonly known as:  GLUCOTROL  Take 1 tablet by mouth 2 (two) times daily.     magnesium gluconate 30 MG tablet  Commonly known as:  MAGONATE  Take 30 mg by mouth 1 day or 1 dose.     metFORMIN 1000 MG tablet  Commonly known as:  GLUCOPHAGE  Take 1,000 mg by mouth 2 (two) times daily with a meal.     mupirocin ointment 2 %  Commonly known as:  BACTROBAN  1 Application, nasally, 2 times daily prior to surgery;     niacin 500 MG tablet  Take 500 mg by mouth daily with breakfast.     OVER THE COUNTER MEDICATION  1 day or 1 dose. Saw palmeto     oxyCODONE 5 MG immediate release tablet  Commonly known as:  ROXICODONE  Take 1 tablet (5 mg total) by mouth every 6 (six) hours as needed for pain.     oxyCODONE 5 MG immediate release tablet  Commonly known as:  Oxy IR/ROXICODONE  Take 1 tablet (5 mg total) by mouth every 6 (six) hours as needed for pain.     POTASSIUM GLUCONATE PO  Take 99 mg by mouth daily.     quinapril-hydrochlorothiazide 20-12.5 MG per tablet  Commonly known as:  ACCURETIC  Take 1 tablet by mouth 2 (two) times daily.  terazosin 10 MG capsule  Commonly  known as:  HYTRIN  Take 10 mg by mouth at bedtime.     vitamin C 500 MG tablet  Commonly known as:  ASCORBIC ACID  Take 1,000 mg by mouth daily.     zinc gluconate 50 MG tablet  Take 50 mg by mouth daily.            Follow-up Information   Follow up with Myra Gianotti IV, Lala Lund, MD In 2 weeks. (Office will call you to arrange your appt (sent0)    Contact information:   [address] [place] [address] [phone]        Clinton Gallant [facility] PA-C [facility] [place] Office: [phone]   09/03/2012, 3:17 PM --- For VQI Registry use --- Instructions: Press F2 to tab through selections.  Delete question if not applicable.   Modified Rankin score at D/C (0-6): Rankin Score=0  IV medication needed for:  1. Hypertension: No 2. Hypotension: No  Post-op Complications: No  1. Post-op CVA or TIA: No  If yes: Event classification (right eye, left eye, right cortical, left cortical, verterobasilar, other):   If yes: Timing of event (intra-op, <6 hrs post-op, >=6 hrs post-op, unknown):   2. CN injury: No  If yes: CN  injuried   3. Myocardial infarction: No  If yes: Dx by (EKG or clinical, Troponin):   4.  CHF: No  5.  Dysrhythmia (new): No  6. Wound infection: No  7. Reperfusion symptoms: No  8. Return to OR: No  If yes: return to OR for (bleeding, neurologic, other CEA incision, other):   Discharge medications: Statin use:  Yes ASA use:  Yes Beta blocker use:  No  for medical reason   ACE-Inhibitor use:  yes P2Y12 Antagonist use: [x ] None, [ ]  Plavix, [ ]  Plasugrel, [ ]  Ticlopinine, [ ]  Ticagrelor, [ ]  Other, [ ]  No for medical reason, [ ]  Non-compliant, [ ]  Not-indicated Anti-coagulant use:  [ ]  None, [ ]  Warfarin, [ ]  Rivaroxaban, [ ]  Dabigatran, [ ]  Other, [ ]  No for medical reason, [ ]  Non-compliant, [ ]  Not-indicated

## 2012-09-03 NOTE — Progress Notes (Addendum)
Pt states he "feels like my throat is closing up".  S/P Right CEA on 09/02/12.  Denies throat pain, numbness or tingling.  Neuro intact, equal grips bilat, tongue midline. Suture line clean with no hematoma palpated..  O2 sats 95% on rrom air.  Enlarged  ?   pharyngeal tissue noted to  right of uvula.  Given cepacol throat spray..  Will continue to closely monitor pt.  0500   Pt states  That the throat spray "helped a lot" .  No longer c/o difficulty swallowing or sensation that his throat is closing up.

## 2012-09-03 NOTE — Progress Notes (Addendum)
VASCULAR AND VEIN SURGERY POST - OP CEA NOTE  HPI:. had a right Carotid Endarterectomy 1 day ago. Patient is doing well.  Patient denies headache; denies difficulty swallowing; sore throat  denies weakness; denies symptoms of stroke or TIA  Filed Vitals:   09/02/12 2000 09/02/12 2125 09/03/12 0000 09/03/12 0400  BP: 129/59 146/75 149/68 138/67  Pulse: 78  84 82  Temp: 97.5 F (36.4 C)  98.1 F (36.7 C) 98.8 F (37.1 C)  TempSrc: Oral  Oral Oral  Resp: 20  21 19   Height:      Weight:      SpO2: 95%  95% 96%     Physical Exam: Pt is A&O x 3 Speech is fluent right Neck Wound is clean, dry, intact Negative weakness right upper Negative tongue deviation Negative facial droop  Plan: Follow-up in 2 weeks  Discharge home  Hatley, Maan Zarcone [facility] PA-C

## 2012-09-03 NOTE — Progress Notes (Signed)
Utilization review completed.  

## 2012-09-03 NOTE — Telephone Encounter (Signed)
LVM re appt info, sent letter - kf °

## 2012-09-04 NOTE — Discharge Summary (Signed)
I agree with the above  William Burke 

## 2012-09-06 ENCOUNTER — Encounter (HOSPITAL_COMMUNITY): Payer: Self-pay | Admitting: Surgery

## 2012-09-06 ENCOUNTER — Ambulatory Visit (INDEPENDENT_AMBULATORY_CARE_PROVIDER_SITE_OTHER): Payer: Medicare Other | Admitting: Surgery

## 2012-09-06 DIAGNOSIS — I6529 Occlusion and stenosis of unspecified carotid artery: Secondary | ICD-10-CM

## 2012-09-06 NOTE — Progress Notes (Signed)
The patient is here today for followup. He had a right carotid endarterectomy on 09/02/2012 for possible symptomatic right carotid stenosis. This is an unscheduled visit today. The patient is concerned about some hoarseness as well as some swallowing difficulties. He states that he is able to swallow as long as food does not touch one particular spot. He feels that his hoarseness is getting better.  The patient otherwise remains neurologically intact. His incision is healing nicely. I offered him the option of going to see a speech therapist or an ear nose and throat doctor. The patient states that he is getting better and would like to give this a few more days. He is scheduled to followup with me this Monday

## 2012-09-17 ENCOUNTER — Encounter: Payer: Self-pay | Admitting: Surgery

## 2012-09-20 ENCOUNTER — Encounter: Payer: Self-pay | Admitting: Surgery

## 2012-09-20 ENCOUNTER — Ambulatory Visit (INDEPENDENT_AMBULATORY_CARE_PROVIDER_SITE_OTHER): Payer: Medicare Other | Admitting: Surgery

## 2012-09-20 DIAGNOSIS — I6529 Occlusion and stenosis of unspecified carotid artery: Secondary | ICD-10-CM

## 2012-09-20 NOTE — Progress Notes (Signed)
The patient is status post right carotid endarterectomy with patch angioplasty for symptomatic right carotid stenosis. The procedure was performed on 09/02/2012. Intraoperative findings included a 90% stenosis without thrombus. The patient's preoperative symptoms consisted of facial numbness. These have completely resolved. I saw him as an unscheduled visit proximally several days after his operation where he was complaining of swallowing trouble. I suspected this was due to endotracheal intubation. The patient states that 2 or 3 days after seeing me his symptoms completely resolved. He has no complaints today.  On examination, his incision is healing nicely. He is neurologically intact.  I have scheduled the patient to followup with me in 6 months. He will be getting his carotid duplex ultrasound with Dr. Allyson Sabal. I told him I want to have his ultrasound done prior to his visit with me.  William Burke

## 2012-10-28 ENCOUNTER — Telehealth (HOSPITAL_COMMUNITY): Payer: Self-pay | Admitting: Cardiovascular Disease

## 2012-10-28 DIAGNOSIS — I6529 Occlusion and stenosis of unspecified carotid artery: Secondary | ICD-10-CM

## 2012-10-28 NOTE — Telephone Encounter (Signed)
I see in Dr Estanislado Spire note that carotid doppler is needed prior to ov in April.  Order placed for carotid doppler

## 2012-10-28 NOTE — Telephone Encounter (Signed)
Pt would like Dr. Allyson Sabal to order a  carotid doppler test before he goes back to see Dr. Myra Gianotti in 03/2013. He stated that Dr. Myra Gianotti could order it but he would much rather Dr. Allyson Sabal do it.

## 2013-03-15 ENCOUNTER — Encounter (HOSPITAL_COMMUNITY): Payer: Medicare Other

## 2013-03-16 ENCOUNTER — Telehealth (HOSPITAL_COMMUNITY): Payer: Self-pay | Admitting: *Deleted

## 2013-03-21 ENCOUNTER — Ambulatory Visit (HOSPITAL_COMMUNITY)
Admission: RE | Admit: 2013-03-21 | Discharge: 2013-03-21 | Disposition: A | Discharge status: Home / Self Care | Payer: Medicare Other | Source: Ambulatory Visit | Attending: Cardiovascular Disease | Admitting: Cardiovascular Disease

## 2013-03-21 DIAGNOSIS — I6529 Occlusion and stenosis of unspecified carotid artery: Secondary | ICD-10-CM

## 2013-03-21 DIAGNOSIS — I739 Peripheral vascular disease, unspecified: Secondary | ICD-10-CM

## 2013-03-21 DIAGNOSIS — I779 Disorder of arteries and arterioles, unspecified: Secondary | ICD-10-CM

## 2013-03-21 DIAGNOSIS — Z9889 Other specified postprocedural states: Secondary | ICD-10-CM | POA: Insufficient documentation

## 2013-03-21 NOTE — Progress Notes (Deleted)
Renal Duplex Completed. Todrick Siedschlag, BS, RDMS, RVT  

## 2013-03-21 NOTE — Progress Notes (Signed)
Carotid Duplex completed post right CEA. Oda Cogan, BS, RDMS, RVT

## 2013-03-28 ENCOUNTER — Ambulatory Visit: Payer: Medicare Other | Admitting: Surgery

## 2013-04-08 ENCOUNTER — Encounter: Payer: Self-pay | Admitting: Surgery

## 2013-04-11 ENCOUNTER — Ambulatory Visit: Payer: Medicare Other | Admitting: Surgery

## 2013-05-02 ENCOUNTER — Ambulatory Visit: Payer: Medicare Other | Admitting: Family

## 2013-05-06 ENCOUNTER — Encounter: Payer: Self-pay | Admitting: Surgery

## 2013-05-09 ENCOUNTER — Ambulatory Visit: Payer: Medicare Other | Admitting: Family

## 2013-05-09 ENCOUNTER — Encounter: Payer: Self-pay | Admitting: Surgery

## 2013-05-09 ENCOUNTER — Other Ambulatory Visit: Payer: Self-pay | Admitting: *Deleted

## 2013-05-09 ENCOUNTER — Ambulatory Visit (INDEPENDENT_AMBULATORY_CARE_PROVIDER_SITE_OTHER): Payer: Medicare Other | Admitting: Surgery

## 2013-05-09 VITALS — BP 184/66 | HR 82 | Ht 65.0 in | Wt 196.0 lb

## 2013-05-09 DIAGNOSIS — I6529 Occlusion and stenosis of unspecified carotid artery: Secondary | ICD-10-CM

## 2013-05-09 MED ORDER — CLOPIDOGREL BISULFATE 75 MG PO TABS: 75.0000 mg | ORAL_TABLET | Freq: Every day | ORAL | Status: DC

## 2013-05-09 NOTE — Addendum Note (Signed)
Addended by: Thresa Ross C on: 05/09/2013 10:07 AM   Modules accepted: Orders

## 2013-05-09 NOTE — Progress Notes (Signed)
Patient name: William Burke MRN: [id number] DOB: 08/26/1940 Sex: male     Chief Complaint  Patient presents with  . Carotid    6 month f /u     HISTORY OF PRESENT ILLNESS: The patient is back today for followup.  He is status post right carotid endarterectomy with bovine pericardial patch angioplasty.  At that time he underwent resection and primary anastomosis of his right common carotid artery.  Intraoperative findings included 90% stenosis.  This was done for symptomatic stenosis.  He denies any additional symptoms.  The numbness that he had prior to his operation has result.  The patient is suffering from a stress fracture in his left knee which is preventing his daily exercise routine.  He has gained 6 pounds over the past 8 weeks.  He continues to suffer from peripheral vascular disease.  He has undergone stenting in the past.  He is medically managed for hypercholesterolemia.  He states that his Lipitor is causing joint problems and is trying to get this changed.  He is a type II diabetic but not on insulin.  His blood sugars are in the 1:30-150 range.  He is on single antiplatelet therapy with a baby aspirin.  He has had trouble tolerating Plavix in the past secondary to nosebleeds given his history of polypectomy.  Past Medical History  Diagnosis Date  . PAD (peripheral artery disease) 06/22/2007    Bilateral iliac artery PTA and stenting with iCAST. Last lower extremity arterial Dopplers 02/25/2011 right ABI 1.0 left ABI 1.1.  Bilateral carotid artery stenosis. Last carotid Dopplers 07/18/2011: Max ICA stenosis 50-69% bilaterally  . Diabetes mellitus, type 2   . Hypertension   . Hyperlipidemia   . History of tobacco abuse   . Carotid artery disease   . Complication of anesthesia     sensitive to anesthesia ,longer to awaken  . UTI (lower urinary tract infection)   . GERD (gastroesophageal reflux disease)     occ    Past Surgical History  Procedure Laterality Date  .  Bowl surgery  1989  . Appendectomy      ruptured as child taken out as adult  . Tonsillectomy    . Wrist surgery Right     pinning  . Cardiac catheterization      aortogram iliac artery stent  . Endarterectomy Right 09/02/2012    Procedure: ENDARTERECTOMY CAROTID with Resection of Common Carotid Artery;  Surgeon: Nada Libman, MD;  [facility];  Service: Vascular;  Laterality: Right;  . Patch angioplasty Right 09/02/2012    Procedure: PATCH ANGIOPLASTY using Vascu-Guard Vascular Patch;  Surgeon: Nada Libman, MD;  Location: [facility] OR;  Service: Vascular;  Laterality: Right;  . Carotid endarterectomy      History   Social History  . Marital Status: Married    Spouse Name: N/A    Number of Children: N/A  . Years of Education: N/A   Occupational History  . Not on file.   Social History Main Topics  . Smoking status: Former Smoker -- 3.00 packs/day for 25 years    Types: Cigarettes    Quit date: 08/06/1987  . Smokeless tobacco: Never Used  . Alcohol Use: No  . Drug Use: No  . Sexual Activity: Not on file   Other Topics Concern  . Not on file   Social History Narrative  . No narrative on file    Family History  Problem Relation Age of Onset  .  Diabetes Mother   . Hypertension Mother   . Cerebral aneurysm Mother   . Multiple sclerosis Father   . Kidney disease Father     Allergies as of 05/09/2013 - Review Complete 05/09/2013  Allergen Reaction Noted  . Plavix [clopidogrel bisulfate]  08/05/2012    Current Outpatient Prescriptions on File Prior to Visit  Medication Sig Dispense Refill  . aspirin EC 81 MG tablet Take 81 mg by mouth daily.      [name] atorvastatin (LIPITOR) 80 MG tablet Take 80 mg by mouth daily.      [name] b complex vitamins tablet Take 1 tablet by mouth daily.      . cholecalciferol (VITAMIN D) 1000 UNITS tablet Take 1,000 Units by mouth daily.      . Chromium Picolinate 500 MCG CAPS Take 500 mg by mouth 1 day or 1 dose.      [name] CINNAMON PO Take 1,000  mg by mouth daily.      [name] diltiazem (TIAZAC) 240 MG 24 hr capsule Take 240 mg by mouth daily.       . fish oil-omega-3 fatty acids 1000 MG capsule Take 1 g by mouth daily.      . Flaxseed, Linseed, (FLAXSEED OIL) 1000 MG CAPS Take 1 capsule by mouth daily.      . fluticasone (VERAMYST) 27.5 MCG/SPRAY nasal spray Place 1 spray into the nose daily.      . Garlic 1000 MG CAPS Take 2 capsules by mouth daily.      [name] glipiZIDE (GLUCOTROL) 10 MG tablet Take 1 tablet by mouth 2 (two) times daily.      . magnesium gluconate (MAGONATE) 30 MG tablet Take 30 mg by mouth 1 day or 1 dose.      . metFORMIN (GLUCOPHAGE) 1000 MG tablet Take 1,000 mg by mouth 2 (two) times daily with a meal.      . mupirocin ointment (BACTROBAN) 2 % 1 Application, nasally, 2 times daily prior to surgery;  22 g  0  . niacin 500 MG tablet Take 500 mg by mouth daily with breakfast.      . OVER THE COUNTER MEDICATION 1 day or 1 dose. Saw palmeto      . oxyCODONE (OXY IR/ROXICODONE) 5 MG immediate release tablet Take 1 tablet (5 mg total) by mouth every 6 (six) hours as needed for pain.  30 tablet  0  . oxyCODONE (ROXICODONE) 5 MG immediate release tablet Take 1 tablet (5 mg total) by mouth every 6 (six) hours as needed for pain.  30 tablet  0  . POTASSIUM GLUCONATE PO Take 99 mg by mouth daily.      . quinapril-hydrochlorothiazide (ACCURETIC) 20-12.5 MG per tablet Take 1 tablet by mouth 2 (two) times daily.      [name] terazosin (HYTRIN) 10 MG capsule Take 10 mg by mouth at bedtime.      . vitamin C (ASCORBIC ACID) 500 MG tablet Take 1,000 mg by mouth daily.      [name] zinc gluconate 50 MG tablet Take 50 mg by mouth daily.       No current facility-administered medications on file prior to visit.     REVIEW OF SYSTEMS: Cardiovascular: No chest pain, chest pressure, palpitations, orthopnea, or dyspnea on exertion. No claudication or rest pain,  No history of DVT or phlebitis.  Positive for leg swelling Pulmonary: No productive cough,  asthma or wheezing. Neurologic: No weakness, paresthesias, aphasia, or amaurosis. No dizziness. Hematologic: No bleeding  problems or clotting disorders. Musculoskeletal: No joint pain or joint swelling. Gastrointestinal: No blood in stool or hematemesis Genitourinary: No dysuria or hematuria. Psychiatric:: No history of major depression. Integumentary: No rashes or ulcers. Constitutional: No fever or chills.  PHYSICAL EXAMINATION:   Vital signs are BP 184/66  Pulse 82  Ht 5\' 5"  (1.651 m)  Wt 196 lb (88.905 kg)  BMI 32.62 kg/m2  SpO2 97% General: The patient appears their stated age. HEENT:  No gross abnormalities Pulmonary:  Non labored breathing Musculoskeletal: There are no major deformities. Neurologic: No focal weakness or paresthesias are detected, Skin: There are no ulcer or rashes noted. Psychiatric: The patient has normal affect. Cardiovascular: There is a regular rate and rhythm with systolic ejection murmur.  Positive right carotid bruit  Diagnostic Studies I have reviewed his outside ultrasound.  This shows greater than 70% stenosis in the mid right carotid artery.  The left side remains 40-69%.  Assessment: Recurrent right carotid stenosis Plan: I discussed proceeding with right carotid angiography and possible stenting given his elevated velocities within the midportion of his right carotid artery.  The patient remained asymptomatic.  We discussed the details of the procedure including the risk of bleeding and the risk of stroke.  The patient has not been able to tolerate Plavix in the past secondary to nosebleeds, however if he is willing to undergo dual antiplatelet therapy so that we can proceed with carotid stenting.  I will coordinate this with Dr. Pearletha Alfred. Charlena Cross, M.D. [facility] [place] Office: [phone] Pager:  [phone]

## 2013-05-10 ENCOUNTER — Telehealth: Payer: Self-pay | Admitting: *Deleted

## 2013-05-10 ENCOUNTER — Other Ambulatory Visit: Payer: Self-pay | Admitting: *Deleted

## 2013-05-10 DIAGNOSIS — I6529 Occlusion and stenosis of unspecified carotid artery: Secondary | ICD-10-CM

## 2013-05-10 MED ORDER — CLOPIDOGREL BISULFATE 75 MG PO TABS: 75.0000 mg | ORAL_TABLET | Freq: Every day | ORAL | Status: DC

## 2013-05-10 NOTE — Telephone Encounter (Signed)
Per Dr Gwenlyn Found, I called to verify that patient will start taking Plavix at least 5 days prior to the carotid stent procedure.  He verified that he was already contacted and will take the Plavix starting on 4/21.

## 2013-05-24 ENCOUNTER — Observation Stay (HOSPITAL_COMMUNITY)
Admission: RE | Admit: 2013-05-24 | Discharge: 2013-05-25 | Disposition: A | Discharge status: Home / Self Care | Payer: Medicare Other | Source: Ambulatory Visit | Attending: Surgery | Admitting: Surgery

## 2013-05-24 ENCOUNTER — Telehealth: Payer: Self-pay | Admitting: Surgery

## 2013-05-24 ENCOUNTER — Other Ambulatory Visit: Payer: Self-pay | Admitting: *Deleted

## 2013-05-24 ENCOUNTER — Encounter (HOSPITAL_COMMUNITY)
Admission: RE | Disposition: A | Discharge status: Home / Self Care | Payer: Medicare Other | Source: Ambulatory Visit | Attending: Surgery

## 2013-05-24 DIAGNOSIS — E785 Hyperlipidemia, unspecified: Secondary | ICD-10-CM | POA: Insufficient documentation

## 2013-05-24 DIAGNOSIS — Z7982 Long term (current) use of aspirin: Secondary | ICD-10-CM | POA: Insufficient documentation

## 2013-05-24 DIAGNOSIS — Z48812 Encounter for surgical aftercare following surgery on the circulatory system: Secondary | ICD-10-CM

## 2013-05-24 DIAGNOSIS — K219 Gastro-esophageal reflux disease without esophagitis: Secondary | ICD-10-CM | POA: Insufficient documentation

## 2013-05-24 DIAGNOSIS — I658 Occlusion and stenosis of other precerebral arteries: Secondary | ICD-10-CM | POA: Insufficient documentation

## 2013-05-24 DIAGNOSIS — I6529 Occlusion and stenosis of unspecified carotid artery: Principal | ICD-10-CM | POA: Insufficient documentation

## 2013-05-24 DIAGNOSIS — I10 Essential (primary) hypertension: Secondary | ICD-10-CM | POA: Insufficient documentation

## 2013-05-24 DIAGNOSIS — E119 Type 2 diabetes mellitus without complications: Secondary | ICD-10-CM | POA: Insufficient documentation

## 2013-05-24 DIAGNOSIS — E7800 Pure hypercholesterolemia, unspecified: Secondary | ICD-10-CM | POA: Insufficient documentation

## 2013-05-24 DIAGNOSIS — Z87891 Personal history of nicotine dependence: Secondary | ICD-10-CM | POA: Insufficient documentation

## 2013-05-24 HISTORY — PX: CAROTID STENT INSERTION: SHX5505

## 2013-05-24 HISTORY — PX: CAROTID STENT: SHX1301

## 2013-05-24 LAB — POCT I-STAT, CHEM 8
BUN: 20 mg/dL (ref 6–23)
CREATININE: 0.8 mg/dL (ref 0.50–1.35)
Calcium, Ion: 1.14 mmol/L (ref 1.13–1.30)
Chloride: 99 mEq/L (ref 96–112)
GLUCOSE: 143 mg/dL — AB (ref 70–99)
HEMATOCRIT: 34 % — AB (ref 39.0–52.0)
Hemoglobin: 11.6 g/dL — ABNORMAL LOW (ref 13.0–17.0)
POTASSIUM: 3.7 meq/L (ref 3.7–5.3)
Sodium: 140 mEq/L (ref 137–147)
TCO2: 24 mmol/L (ref 0–100)

## 2013-05-24 LAB — GLUCOSE, CAPILLARY: GLUCOSE-CAPILLARY: 160 mg/dL — AB (ref 70–99)

## 2013-05-24 LAB — POCT ACTIVATED CLOTTING TIME
ACTIVATED CLOTTING TIME: 238 s
Activated Clotting Time: 321 seconds

## 2013-05-24 SURGERY — CAROTID STENT INSERTION
Anesthesia: LOCAL | Laterality: Right

## 2013-05-24 MED ORDER — HYDROCHLOROTHIAZIDE 12.5 MG PO CAPS
12.5000 mg | ORAL_CAPSULE | Freq: Every day | ORAL | Status: DC
  Filled 2013-05-24: qty 1

## 2013-05-24 MED ORDER — NOREPINEPHRINE BITARTRATE 1 MG/ML IJ SOLN
INTRAMUSCULAR | Status: AC
  Filled 2013-05-24: qty 4

## 2013-05-24 MED ORDER — VITAMIN C 500 MG PO TABS
1000.0000 mg | ORAL_TABLET | Freq: Every day | ORAL | Status: DC
  Filled 2013-05-24: qty 2

## 2013-05-24 MED ORDER — ALUM & MAG HYDROXIDE-SIMETH 200-200-20 MG/5ML PO SUSP: 15.0000 mL | ORAL | Status: DC | PRN

## 2013-05-24 MED ORDER — DILTIAZEM HCL ER BEADS 240 MG PO CP24
240.0000 mg | ORAL_CAPSULE | Freq: Every day | ORAL | Status: DC
  Filled 2013-05-24: qty 1

## 2013-05-24 MED ORDER — QUINAPRIL-HYDROCHLOROTHIAZIDE 20-12.5 MG PO TABS: 1.0000 | ORAL_TABLET | Freq: Two times a day (BID) | ORAL | Status: DC

## 2013-05-24 MED ORDER — HEPARIN (PORCINE) IN NACL 2-0.9 UNIT/ML-% IJ SOLN
INTRAMUSCULAR | Status: AC
  Filled 2013-05-24: qty 1000

## 2013-05-24 MED ORDER — BIVALIRUDIN 250 MG IV SOLR
INTRAVENOUS | Status: AC
  Filled 2013-05-24: qty 250

## 2013-05-24 MED ORDER — LISINOPRIL 20 MG PO TABS
20.0000 mg | ORAL_TABLET | Freq: Every day | ORAL | Status: DC
  Filled 2013-05-24: qty 1

## 2013-05-24 MED ORDER — ATROPINE SULFATE 0.1 MG/ML IJ SOLN
INTRAMUSCULAR | Status: AC
  Filled 2013-05-24: qty 10

## 2013-05-24 MED ORDER — ATORVASTATIN CALCIUM 80 MG PO TABS
80.0000 mg | ORAL_TABLET | Freq: Every day | ORAL | Status: DC
  Filled 2013-05-24 (×2): qty 1

## 2013-05-24 MED ORDER — ONDANSETRON HCL 4 MG/2ML IJ SOLN: 4.0000 mg | Freq: Four times a day (QID) | INTRAMUSCULAR | Status: DC | PRN

## 2013-05-24 MED ORDER — METOPROLOL TARTRATE 1 MG/ML IV SOLN: 2.0000 mg | INTRAVENOUS | Status: DC | PRN

## 2013-05-24 MED ORDER — SODIUM CHLORIDE 0.9 % IV SOLN: INTRAVENOUS | Status: DC

## 2013-05-24 MED ORDER — TERAZOSIN HCL 5 MG PO CAPS
10.0000 mg | ORAL_CAPSULE | Freq: Every day | ORAL | Status: DC
  Administered 2013-05-24: 10 mg via ORAL
  Filled 2013-05-24 (×2): qty 2

## 2013-05-24 MED ORDER — CLOPIDOGREL BISULFATE 75 MG PO TABS: 75.0000 mg | ORAL_TABLET | Freq: Every day | ORAL | Status: DC

## 2013-05-24 MED ORDER — PHENOL 1.4 % MT LIQD: 1.0000 | OROMUCOSAL | Status: DC | PRN

## 2013-05-24 MED ORDER — LABETALOL HCL 5 MG/ML IV SOLN: 10.0000 mg | INTRAVENOUS | Status: DC | PRN

## 2013-05-24 MED ORDER — HYDRALAZINE HCL 20 MG/ML IJ SOLN: 10.0000 mg | INTRAMUSCULAR | Status: DC | PRN

## 2013-05-24 MED ORDER — ACETAMINOPHEN 650 MG RE SUPP: 325.0000 mg | RECTAL | Status: DC | PRN

## 2013-05-24 MED ORDER — GUAIFENESIN-DM 100-10 MG/5ML PO SYRP: 15.0000 mL | ORAL_SOLUTION | ORAL | Status: DC | PRN

## 2013-05-24 MED ORDER — ACETAMINOPHEN 325 MG PO TABS
325.0000 mg | ORAL_TABLET | ORAL | Status: DC | PRN
  Administered 2013-05-24: 650 mg via ORAL
  Filled 2013-05-24: qty 2

## 2013-05-24 MED ORDER — GLIPIZIDE 10 MG PO TABS
10.0000 mg | ORAL_TABLET | Freq: Two times a day (BID) | ORAL | Status: DC
  Administered 2013-05-24: 10 mg via ORAL
  Filled 2013-05-24 (×4): qty 1

## 2013-05-24 MED ORDER — LIDOCAINE HCL (PF) 1 % IJ SOLN
INTRAMUSCULAR | Status: AC
  Filled 2013-05-24: qty 30

## 2013-05-24 MED ORDER — ASPIRIN 81 MG PO CHEW
81.0000 mg | CHEWABLE_TABLET | Freq: Once | ORAL | Status: AC
  Administered 2013-05-24: 81 mg via ORAL

## 2013-05-24 MED ORDER — VITAMIN D3 25 MCG (1000 UNIT) PO TABS
1000.0000 [IU] | ORAL_TABLET | Freq: Every day | ORAL | Status: DC
  Filled 2013-05-24: qty 1

## 2013-05-24 MED ORDER — SODIUM CHLORIDE 0.9 % IV SOLN
INTRAVENOUS | Status: DC
  Administered 2013-05-24: 06:00:00 via INTRAVENOUS

## 2013-05-24 MED ORDER — NIACIN 500 MG PO TABS
500.0000 mg | ORAL_TABLET | Freq: Every day | ORAL | Status: DC
  Filled 2013-05-24 (×2): qty 1

## 2013-05-24 MED ORDER — ASPIRIN EC 81 MG PO TBEC
81.0000 mg | DELAYED_RELEASE_TABLET | Freq: Every day | ORAL | Status: DC
  Filled 2013-05-24: qty 1

## 2013-05-24 MED ORDER — ASPIRIN 81 MG PO CHEW
CHEWABLE_TABLET | ORAL | Status: AC
  Filled 2013-05-24: qty 1

## 2013-05-24 MED ORDER — CLOPIDOGREL BISULFATE 75 MG PO TABS
75.0000 mg | ORAL_TABLET | Freq: Every day | ORAL | Status: DC
  Filled 2013-05-24: qty 1

## 2013-05-24 MED ORDER — FLUTICASONE PROPIONATE 50 MCG/ACT NA SUSP
1.0000 | Freq: Every day | NASAL | Status: DC
  Filled 2013-05-24: qty 16

## 2013-05-24 NOTE — Progress Notes (Signed)
Pt arrived from cath lab, VSS, groin level 0, will continue to monitor.

## 2013-05-24 NOTE — H&P (View-Only) (Signed)
Patient name: William Burke MRN: [id number] DOB: 08/26/1940 Sex: male     Chief Complaint  Patient presents with  . Carotid    6 month f /u     HISTORY OF PRESENT ILLNESS: The patient is back today for followup.  He is status post right carotid endarterectomy with bovine pericardial patch angioplasty.  At that time he underwent resection and primary anastomosis of his right common carotid artery.  Intraoperative findings included 90% stenosis.  This was done for symptomatic stenosis.  He denies any additional symptoms.  The numbness that he had prior to his operation has result.  The patient is suffering from a stress fracture in his left knee which is preventing his daily exercise routine.  He has gained 6 pounds over the past 8 weeks.  He continues to suffer from peripheral vascular disease.  He has undergone stenting in the past.  He is medically managed for hypercholesterolemia.  He states that his Lipitor is causing joint problems and is trying to get this changed.  He is a type II diabetic but not on insulin.  His blood sugars are in the 1:30-150 range.  He is on single antiplatelet therapy with a baby aspirin.  He has had trouble tolerating Plavix in the past secondary to nosebleeds given his history of polypectomy.  Past Medical History  Diagnosis Date  . PAD (peripheral artery disease) 06/22/2007    Bilateral iliac artery PTA and stenting with iCAST. Last lower extremity arterial Dopplers 02/25/2011 right ABI 1.0 left ABI 1.1.  Bilateral carotid artery stenosis. Last carotid Dopplers 07/18/2011: Max ICA stenosis 50-69% bilaterally  . Diabetes mellitus, type 2   . Hypertension   . Hyperlipidemia   . History of tobacco abuse   . Carotid artery disease   . Complication of anesthesia     sensitive to anesthesia ,longer to awaken  . UTI (lower urinary tract infection)   . GERD (gastroesophageal reflux disease)     occ    Past Surgical History  Procedure Laterality Date  .  Bowl surgery  1989  . Appendectomy      ruptured as child taken out as adult  . Tonsillectomy    . Wrist surgery Right     pinning  . Cardiac catheterization      aortogram iliac artery stent  . Endarterectomy Right 09/02/2012    Procedure: ENDARTERECTOMY CAROTID with Resection of Common Carotid Artery;  Surgeon: Nada Libman, MD;  [facility] OR;  Service: Vascular;  Laterality: Right;  . Patch angioplasty Right 09/02/2012    Procedure: PATCH ANGIOPLASTY using Vascu-Guard Vascular Patch;  Surgeon: Nada Libman, MD;  Location: [facility] OR;  Service: Vascular;  Laterality: Right;  . Carotid endarterectomy      History   Social History  . Marital Status: Married    Spouse Name: N/A    Number of Children: N/A  . Years of Education: N/A   Occupational History  . Not on file.   Social History Main Topics  . Smoking status: Former Smoker -- 3.00 packs/day for 25 years    Types: Cigarettes    Quit date: 08/06/1987  . Smokeless tobacco: Never Used  . Alcohol Use: No  . Drug Use: No  . Sexual Activity: Not on file   Other Topics Concern  . Not on file   Social History Narrative  . No narrative on file    Family History  Problem Relation Age of Onset  .  Diabetes Mother   . Hypertension Mother   . Cerebral aneurysm Mother   . Multiple sclerosis Father   . Kidney disease Father     Allergies as of 05/09/2013 - Review Complete 05/09/2013  Allergen Reaction Noted  . Plavix [clopidogrel bisulfate]  08/05/2012    Current Outpatient Prescriptions on File Prior to Visit  Medication Sig Dispense Refill  . aspirin EC 81 MG tablet Take 81 mg by mouth daily.      [name] atorvastatin (LIPITOR) 80 MG tablet Take 80 mg by mouth daily.      [name] b complex vitamins tablet Take 1 tablet by mouth daily.      . cholecalciferol (VITAMIN D) 1000 UNITS tablet Take 1,000 Units by mouth daily.      . Chromium Picolinate 500 MCG CAPS Take 500 mg by mouth 1 day or 1 dose.      [name] CINNAMON PO Take 1,000  mg by mouth daily.      [name] diltiazem (TIAZAC) 240 MG 24 hr capsule Take 240 mg by mouth daily.       . fish oil-omega-3 fatty acids 1000 MG capsule Take 1 g by mouth daily.      . Flaxseed, Linseed, (FLAXSEED OIL) 1000 MG CAPS Take 1 capsule by mouth daily.      . fluticasone (VERAMYST) 27.5 MCG/SPRAY nasal spray Place 1 spray into the nose daily.      . Garlic 1000 MG CAPS Take 2 capsules by mouth daily.      [name] glipiZIDE (GLUCOTROL) 10 MG tablet Take 1 tablet by mouth 2 (two) times daily.      . magnesium gluconate (MAGONATE) 30 MG tablet Take 30 mg by mouth 1 day or 1 dose.      . metFORMIN (GLUCOPHAGE) 1000 MG tablet Take 1,000 mg by mouth 2 (two) times daily with a meal.      . mupirocin ointment (BACTROBAN) 2 % 1 Application, nasally, 2 times daily prior to surgery;  22 g  0  . niacin 500 MG tablet Take 500 mg by mouth daily with breakfast.      . OVER THE COUNTER MEDICATION 1 day or 1 dose. Saw palmeto      . oxyCODONE (OXY IR/ROXICODONE) 5 MG immediate release tablet Take 1 tablet (5 mg total) by mouth every 6 (six) hours as needed for pain.  30 tablet  0  . oxyCODONE (ROXICODONE) 5 MG immediate release tablet Take 1 tablet (5 mg total) by mouth every 6 (six) hours as needed for pain.  30 tablet  0  . POTASSIUM GLUCONATE PO Take 99 mg by mouth daily.      . quinapril-hydrochlorothiazide (ACCURETIC) 20-12.5 MG per tablet Take 1 tablet by mouth 2 (two) times daily.      [name] terazosin (HYTRIN) 10 MG capsule Take 10 mg by mouth at bedtime.      . vitamin C (ASCORBIC ACID) 500 MG tablet Take 1,000 mg by mouth daily.      [name] zinc gluconate 50 MG tablet Take 50 mg by mouth daily.       No current facility-administered medications on file prior to visit.     REVIEW OF SYSTEMS: Cardiovascular: No chest pain, chest pressure, palpitations, orthopnea, or dyspnea on exertion. No claudication or rest pain,  No history of DVT or phlebitis.  Positive for leg swelling Pulmonary: No productive cough,  asthma or wheezing. Neurologic: No weakness, paresthesias, aphasia, or amaurosis. No dizziness. Hematologic: No bleeding  problems or clotting disorders. Musculoskeletal: No joint pain or joint swelling. Gastrointestinal: No blood in stool or hematemesis Genitourinary: No dysuria or hematuria. Psychiatric:: No history of major depression. Integumentary: No rashes or ulcers. Constitutional: No fever or chills.  PHYSICAL EXAMINATION:   Vital signs are BP 184/66  Pulse 82  Ht 5\' 5"  (1.651 m)  Wt 196 lb (88.905 kg)  BMI 32.62 kg/m2  SpO2 97% General: The patient appears their stated age. HEENT:  No gross abnormalities Pulmonary:  Non labored breathing Musculoskeletal: There are no major deformities. Neurologic: No focal weakness or paresthesias are detected, Skin: There are no ulcer or rashes noted. Psychiatric: The patient has normal affect. Cardiovascular: There is a regular rate and rhythm with systolic ejection murmur.  Positive right carotid bruit  Diagnostic Studies I have reviewed his outside ultrasound.  This shows greater than 70% stenosis in the mid right carotid artery.  The left side remains 40-69%.  Assessment: Recurrent right carotid stenosis Plan: I discussed proceeding with right carotid angiography and possible stenting given his elevated velocities within the midportion of his right carotid artery.  The patient remained asymptomatic.  We discussed the details of the procedure including the risk of bleeding and the risk of stroke.  The patient has not been able to tolerate Plavix in the past secondary to nosebleeds, however if he is willing to undergo dual antiplatelet therapy so that we can proceed with carotid stenting.  I will coordinate this with Dr. Pearletha Alfred. Charlena Cross, M.D. [facility] [place] Office: [phone] Pager:  [phone]

## 2013-05-24 NOTE — Interval H&P Note (Signed)
History and Physical Interval Note:  05/24/2013 7:22 AM  William Burke  has presented today for surgery, with the diagnosis of pvd  The various methods of treatment have been discussed with the patient and family. After consideration of risks, benefits and other options for treatment, the patient has consented to  Procedure(s): CAROTID STENT INSERTION (Right) as a surgical intervention .  The patient's history has been reviewed, patient examined, no change in status, stable for surgery.  I have reviewed the patient's chart and labs.  Questions were answered to the patient's satisfaction.     Serafina Mitchell

## 2013-05-24 NOTE — Telephone Encounter (Addendum)
Message copied by Gena Fray on Tue May 24, 2013 11:03 AM ------      Message from: Mena Goes      Created: Tue May 24, 2013  9:27 AM      Regarding: Schedule                   ----- Message -----         From: Serafina Mitchell, MD         Sent: 05/24/2013   9:18 AM           To: [facility] Charge Pool            05/24/2013:            Surgeon:  Burtis Junes      Procedure Performed:       1.  ultrasound-guided access, right common femoral artery       2.  aortic arch angiogram       3.  right carotid stenting with distal embolic protection                  I am the billing position for all procedures today.  Please schedule the patient for followup in one month with a carotid duplex             ------  05/24/13: unable to hear pt on phone, bad connection. Mailed letter, dpm

## 2013-05-24 NOTE — Op Note (Signed)
Patient name: William Burke MRN: [id number] DOB: 1940/11/08 Sex: male  05/24/2013 Pre-operative Diagnosis: Recurrent right carotid stenosis Post-operative diagnosis:  Same Surgeon:  Gomez Cleverly Procedure Performed:  1.  ultrasound-guided access, right common femoral artery  2.  aortic arch angiogram  3.  right carotid stenting with distal embolic protection   Indications:  The patient is previously undergone right carotid endarterectomy for asymptomatic stenosis.  During his surgery, resection and primary anastomosis was required to address a redundancy within the artery.  His postoperative ultrasound in 6 months showed a recurrent high-grade stenosis.  I felt he was better suited with stenting rather than redo endarterectomy given the challenges associated with this first operation.  He is here today for further evaluation and possible treatment.  Procedure:  The patient was identified in the holding area and taken to room 8.  The patient was then placed supine on the table and prepped and draped in the usual sterile fashion.  A time out was called.  Ultrasound was used to evaluate the right common femoral artery.  It was patent .  A digital ultrasound image was acquired.  A micropuncture needle was used to access the right common femoral artery under ultrasound guidance.  An 018 wire was advanced without resistance and a micropuncture sheath was placed.  The 018 wire was removed and a benson wire was placed.  The micropuncture sheath was exchanged for a 5 french sheath.  A pigtail catheter was advanced into the ascending aorta and an aortic arteriogram was performed.  Next a Berenstein 2 catheter was used to select the innominate artery.  It was advanced over a wire into the right common carotid artery a right carotid angiogram was performed.  With intracranial imaging.  Intracranial imaging will be separately interpreted by the neuroradiology per  Findings:   Aortic arch:   A type I aortic arch is identified.  There is calcification at the origin of the innominate artery which is widely patent.  The visualized portions of the right subclavian, proximal right common carotid artery, left common carotid artery and left subclavian artery are widely patent  Right carotid:  The right common carotid artery is widely patent.  There is a patulous dilatation of the carotid bulb consistent with prior endarterectomy and patch angioplasty.  There is a high grade, greater than 75% stenosis within the mid internal carotid artery, likely corresponding to the distal end of the patch.  The right external carotid artery is widely patent   Intervention:  After the above images were acquired, the decision was made to proceed with intervention.  A Amplatz superstiff wire was inserted through the Berenstein 2 catheter and advanced into the right external iliac artery.  The short sheath in the groin was removed and exchanged out for a 6 [place] shuttle sheath.  Angiomax bolus and continuous infusion was initiated.  The sheath was advanced into the descending thoracic aorta.  A JB 1 catheter was advanced over the wire into the distal right common carotid artery.  The 6 [place] shuttle sheath and a tract over the catheter and was positioned in the distal common carotid artery.  Once we confirmed that the ACT was therapeutic, a large NAV-6 filter was prepared and advanced easily across the lesion and fully deployed.  Next, a 3 x 2 balloon was used to predilate the stenosis.  An XACT 7 x 20 stent was selected, advanced over the wire and positioned across the stenosis and  then deployed.  This was postdilated with a 4 x 15 balloon.  Completion angiography showed resolution of the stenosis.  The filter retrieval device was used to retrieve the filter. Intracranial images were performed and grossly appeared unchanged.  The patient was neurologically intact.  The intracranial images we separately interpreted by the  neuroradiology.  The long 6 French sheath was exchanged out for a short 6 [place] sheath.  The patient taken the holding area for sheath pull.  Impression:  #1  successful right carotid stenting with distal embolic protection for a greater than 75% lesion.  Post treatment reveals less than 10% residual stenosis.  A 7 x 20 XACT stent was utilized with a large NAV-6 filter   V. Durene Cal, M.D. [facility] [place] Office: [phone] Pager:  [phone]

## 2013-05-25 ENCOUNTER — Encounter (HOSPITAL_COMMUNITY): Payer: Self-pay | Admitting: *Deleted

## 2013-05-25 LAB — MRSA PCR SCREENING: MRSA BY PCR: NEGATIVE

## 2013-05-25 MED ORDER — CLOPIDOGREL BISULFATE 75 MG PO TABS: 75.0000 mg | ORAL_TABLET | Freq: Every day | ORAL | Status: AC

## 2013-05-25 MED FILL — Sodium Chloride IV Soln 0.9%: INTRAVENOUS | Qty: 50 | Status: AC

## 2013-05-25 NOTE — Discharge Summary (Signed)
[facility] Discharge Summary   Patient ID:  William Burke MRN: [id number] DOB/AGE: [birth year] 73 y.o.  Admit date: 05/24/2013 Discharge date: 05/25/2013 Date of Surgery: 05/24/2013 Surgeon: Surgeon(s): Nada Libman, MD William Gess, MD  Admission Diagnosis: pvd  Discharge Diagnoses:  pvd  Secondary Diagnoses: Past Medical History  Diagnosis Date  . PAD (peripheral artery disease) 06/22/2007    Bilateral iliac artery PTA and stenting with iCAST. Last lower extremity arterial Dopplers 02/25/2011 right ABI 1.0 left ABI 1.1.  Bilateral carotid artery stenosis. Last carotid Dopplers 07/18/2011: Max ICA stenosis 50-69% bilaterally  . Diabetes mellitus, type 2   . Hypertension   . Hyperlipidemia   . History of tobacco abuse   . Carotid artery disease   . Complication of anesthesia     sensitive to anesthesia ,longer to awaken  . UTI (lower urinary tract infection)   . GERD (gastroesophageal reflux disease)     occ    Procedure(s): CAROTID STENT INSERTION  Discharged Condition: good  HPI: Re-stenosis right carotid artery status post right carotid endarterectomy with bovine pericardial patch angioplasty.  He underwent right carotid stent with distal embolic protection for a greater than 75% lesion.  He will re-start his Plavix 75 mg daily.  His stay was uneventful.  He is ambulating, taking PO's well and voiding.  No sign of stroke.  No weakness, fluid speech, and no vision changes.   Hospital Course:  William Burke is a 73 y.o. male is S/P Right Procedure(s): CAROTID STENT INSERTION Extubated: POD # 0 Physical exam: Grip 5/5 equal, palpable radial pulses equal.  Right groin soft without hematoma.  No facial drooping, no extremity weakness.  Speech is fluid.  Post-op wounds healing well Pt. Ambulating, voiding and taking PO diet without difficulty. Pt pain controlled with PO pain meds. Labs as below Complications:none  Consults:      Significant Diagnostic Studies: CBC Lab Results  Component Value Date   WBC 9.4 09/03/2012   HGB 11.6* 05/24/2013   HCT 34.0* 05/24/2013   MCV 89.0 09/03/2012   PLT 150 09/03/2012    BMET    Component Value Date/Time   NA 140 05/24/2013 0633   K 3.7 05/24/2013 0633   CL 99 05/24/2013 0633   CO2 26 09/03/2012 0447   GLUCOSE 143* 05/24/2013 0633   BUN 20 05/24/2013 0633   CREATININE 0.80 05/24/2013 0633   CALCIUM 8.9 09/03/2012 0447   GFRNONAA >90 09/03/2012 0447   GFRAA >90 09/03/2012 0447   COAG Lab Results  Component Value Date   INR 1.01 08/30/2012     Disposition:  Discharge to :Home Discharge Orders   Future Appointments Provider Department Dept Phone   07/04/2013 11:00 AM [facility] [place] [facility] [address] [place] [address] [phone]   07/04/2013 12:15 PM Nada Libman, MD [facility] [phone]   Future Orders Complete By Expires   Call MD for:  redness, tenderness, or signs of infection (pain, swelling, bleeding, redness, odor or green/yellow discharge around incision site)  As directed    Call MD for:  severe or increased pain, loss or decreased feeling  in affected limb(s)  As directed    Call MD for:  temperature >100.5  As directed    Discharge instructions  As directed    Driving Restrictions  As directed    Lifting restrictions  As directed    Resume previous diet  As directed  Medication List         aspirin EC 81 MG tablet  Take 81 mg by mouth daily.     atorvastatin 80 MG tablet  Commonly known as:  LIPITOR  Take 80 mg by mouth daily.     b complex vitamins tablet  Take 1 tablet by mouth daily.     cholecalciferol 1000 UNITS tablet  Commonly known as:  VITAMIN D  Take 1,000 Units by mouth daily.     Chromium Picolinate 500 MCG Caps  Take 500 mg by mouth daily.     CINNAMON PO  Take 1,000 mg by mouth daily.     clopidogrel 75 MG tablet  Commonly known as:  PLAVIX  Take 75 mg by mouth daily.     clopidogrel  75 MG tablet  Commonly known as:  PLAVIX  Take 1 tablet (75 mg total) by mouth daily.     diltiazem 240 MG 24 hr capsule  Commonly known as:  TIAZAC  Take 240 mg by mouth daily.     fish oil-omega-3 fatty acids 1000 MG capsule  Take 1 g by mouth daily.     Flaxseed Oil 1000 MG Caps  Take 1 capsule by mouth daily.     fluticasone 50 MCG/ACT nasal spray  Commonly known as:  FLONASE  Place 1 spray into both nostrils daily.     Garlic 1000 MG Caps  Take 2 capsules by mouth daily.     glipiZIDE 10 MG tablet  Commonly known as:  GLUCOTROL  Take 1 tablet by mouth 2 (two) times daily.     magnesium gluconate 30 MG tablet  Commonly known as:  MAGONATE  Take 30 mg by mouth 1 day or 1 dose.     metFORMIN 1000 MG tablet  Commonly known as:  GLUCOPHAGE  Take 1,000 mg by mouth 2 (two) times daily with a meal.     niacin 500 MG tablet  Take 500 mg by mouth daily with breakfast.     OSTEO BI-FLEX ADV JOINT SHIELD PO  Take 1 tablet by mouth daily.     OVER THE COUNTER MEDICATION  Take 1 tablet by mouth daily. Saw palmeto     POTASSIUM GLUCONATE PO  Take 99 mg by mouth daily.     quinapril-hydrochlorothiazide 20-12.5 MG per tablet  Commonly known as:  ACCURETIC  Take 1 tablet by mouth 2 (two) times daily.     terazosin 10 MG capsule  Commonly known as:  HYTRIN  Take 10 mg by mouth at bedtime.     vitamin C 500 MG tablet  Commonly known as:  ASCORBIC ACID  Take 1,000 mg by mouth daily.     zinc gluconate 50 MG tablet  Take 50 mg by mouth daily.       Verbal and written Discharge instructions given to the patient. Wound care per Discharge AVS  F/U with Dr. Myra Gianotti in the office in 2 weeks Dr. Erlene Quan will prescribe his Plavix  Signed: Lars Mage 05/25/2013, 7:32 AM

## 2013-05-25 NOTE — Progress Notes (Signed)
[facility] [place]  Subjective  - He is doing well.  Taking PO's, ambulating and voided.  S/P right carotid stent.  He is ready to go home.  He has re-started his Plavix.   Objective 134/48 73 98.1 F (36.7 C) (Oral) 14 92%  Intake/Output Summary (Last 24 hours) at 05/25/13 0725 Last data filed at 05/25/13 0100  Gross per 24 hour  Intake      0 ml  Output    755 ml  Net   -755 ml    Grip 5/5 equal, palpable radial pulses equal. Right groin soft without hematoma. No facial drooping, no extremity weakness. Speech is fluid.  Assessment/Planning: POD #1  Procedure Performed:  1. ultrasound-guided access, right common femoral artery  2. aortic arch angiogram  3. right carotid stenting with distal embolic protection  D/C home  Ulyses Amor 05/25/2013 7:25 AM --  Laboratory Lab Results:  Recent Labs  05/24/13 0633  HGB 11.6*  HCT 34.0*   BMET  Recent Labs  05/24/13 0633  NA 140  K 3.7  CL 99  GLUCOSE 143*  BUN 20  CREATININE 0.80    COAG Lab Results  Component Value Date   INR 1.01 08/30/2012   No results found for this basename: PTT

## 2013-05-25 NOTE — Discharge Instructions (Signed)
. °  Carotid Endarterectomy, Care After °Refer to this sheet in the next few weeks. These instructions provide you with information on caring for yourself after your procedure. Your health care provider may also give you more specific instructions. Your treatment has been planned according to current medical practices, but problems sometimes occur. Call your health care provider if you have any problems or questions after your procedure.  °WHAT TO EXPECT AFTER THE PROCEDURE °You may have some pain or an ache in your neck for up to 2 weeks. This is normal. Recovery time varies depending on your age, condition, general health, and other factors. You will likely be able to return to a normal lifestyle within a few weeks.  °HOME CARE INSTRUCTIONS  °· Take showers if your health care provider approves. Do not take tub baths or go swimming until your health care provider says it is okay.   °· Only take over-the-counter or prescription medicines as directed by your health care provider. If a blood thinner (anticoagulant) is prescribed after surgery, take this medicine exactly as directed.   °· Change bandages (dressings) as directed by your health care provider.   °· Avoid heavy lifting or strenuous activity until your health care provider says it is okay. Resume your normal activities as directed.   °· Stop smoking if you smoke. This is a risk factor for poor wound healing.   °· Stop taking the pill (oral contraceptives) unless your health care provider recommends otherwise.   °· Maintain good control of your blood pressure.   °· Exercise regularly or as instructed by your health care provider.   °· Eat a heart-healthy diet. Talk to your health care provider about how to lower blood lipids (cholesterol and triglycerides).   °· Follow up with your health care provider as directed. Make an appointment for the removal of stitches (sutures) or staples. °SEEK MEDICAL CARE IF:  °· You have increased bleeding from the incision  site.   °· You notice redness, swelling, or increasing pain at the incision site.   °· You notice swelling in your neck or have difficulty breathing or talking.   °· You notice a bad smell or pus coming from the incision site or dressing.   °· You have an oral temperature above 101°F (38.3°C).   °· You develop a rash.   °· You develop any reaction or side effects to medicine given.   °SEEK IMMEDIATE MEDICAL CARE IF:  °· Your initial symptoms are getting worse rather than better.   °· You develop any abnormal bruising or bleeding.   °· You have difficulty breathing.   °· You develop chest pain, shortness of breath, or pain or swelling in your legs.   °· You have a return of symptoms or problems that caused you to have this surgery.   °· You develop a temporary loss of vision.   °· You develop temporary numbness on one side.   °· You develop a temporary inability to speak (aphasia).   °· You develop temporary weakness.   °MAKE SURE YOU:  °· Understand these instructions. °· Will watch your condition. °· Will get help right away if you are not doing well or get worse. °Document Released: 08/02/2004 Document Revised: 09/15/2012 Document Reviewed: 06/16/2012 °[employer]® Patient Information ©2014 [employer]. ° °

## 2013-05-25 NOTE — Discharge Summary (Signed)
I agree with the above  Wells Brabham 

## 2013-05-25 NOTE — Progress Notes (Signed)
Dollene Primrose Herling to be D/C'd Home per MD order.  Discussed with the patient and all questions fully answered.    Medication List         aspirin EC 81 MG tablet  Take 81 mg by mouth daily.     atorvastatin 80 MG tablet  Commonly known as:  LIPITOR  Take 80 mg by mouth daily.     b complex vitamins tablet  Take 1 tablet by mouth daily.     cholecalciferol 1000 UNITS tablet  Commonly known as:  VITAMIN D  Take 1,000 Units by mouth daily.     Chromium Picolinate 500 MCG Caps  Take 500 mg by mouth daily.     CINNAMON PO  Take 1,000 mg by mouth daily.     clopidogrel 75 MG tablet  Commonly known as:  PLAVIX  Take 75 mg by mouth daily.     clopidogrel 75 MG tablet  Commonly known as:  PLAVIX  Take 1 tablet (75 mg total) by mouth daily.     diltiazem 240 MG 24 hr capsule  Commonly known as:  TIAZAC  Take 240 mg by mouth daily.     fish oil-omega-3 fatty acids 1000 MG capsule  Take 1 g by mouth daily.     Flaxseed Oil 1000 MG Caps  Take 1 capsule by mouth daily.     fluticasone 50 MCG/ACT nasal spray  Commonly known as:  FLONASE  Place 1 spray into both nostrils daily.     Garlic 8786 MG Caps  Take 2 capsules by mouth daily.     glipiZIDE 10 MG tablet  Commonly known as:  GLUCOTROL  Take 1 tablet by mouth 2 (two) times daily.     magnesium gluconate 30 MG tablet  Commonly known as:  MAGONATE  Take 30 mg by mouth 1 day or 1 dose.     metFORMIN 1000 MG tablet  Commonly known as:  GLUCOPHAGE  Take 1,000 mg by mouth 2 (two) times daily with a meal.     niacin 500 MG tablet  Take 500 mg by mouth daily with breakfast.     OSTEO BI-FLEX ADV JOINT SHIELD PO  Take 1 tablet by mouth daily.     OVER THE COUNTER MEDICATION  Take 1 tablet by mouth daily. Saw palmeto     POTASSIUM GLUCONATE PO  Take 99 mg by mouth daily.     quinapril-hydrochlorothiazide 20-12.5 MG per tablet  Commonly known as:  ACCURETIC  Take 1 tablet by mouth 2 (two) times daily.     terazosin 10 MG capsule  Commonly known as:  HYTRIN  Take 10 mg by mouth at bedtime.     vitamin C 500 MG tablet  Commonly known as:  ASCORBIC ACID  Take 1,000 mg by mouth daily.     zinc gluconate 50 MG tablet  Take 50 mg by mouth daily.        VVS, Skin clean, dry and intact without evidence of skin break down, no evidence of skin tears noted. IV catheter discontinued intact. Site without signs and symptoms of complications. Dressing and pressure applied.  An After Visit Summary was printed and given to the patient.  D/c education completed with patient/family including follow up instructions, medication list, d/c activities limitations if indicated, with other d/c instructions as indicated by MD - patient able to verbalize understanding, all questions fully answered.   Patient instructed to return to ED, call 911, or call MD  for any changes in condition.   Patient escorted via [place], and D/C home via private auto.  Marjory Sneddon Shalaina Guardiola 05/25/2013 9:00 AM

## 2013-05-30 ENCOUNTER — Telehealth (HOSPITAL_COMMUNITY): Payer: Self-pay | Admitting: *Deleted

## 2013-05-30 NOTE — Telephone Encounter (Signed)
Pt would like you to call him regarding his cholesterol medication.

## 2013-06-01 NOTE — Consult Note (Signed)
NAMENICKOLI, BAGHERI                ACCOUNT NO.:  [id number]  MEDICAL RECORD NO.:  [id number]  LOCATION:  3S04C                        FACILITY:  [facility]  PHYSICIAN:  Gregrey Bloyd K. Idrees Quam, M.D.DATE OF BIRTH:  [birth year]  DATE OF CONSULTATION: DATE OF DISCHARGE:  05/25/2013                                CONSULTATION   Examination is intracranial interpretation of right common carotid artery before and after placement of stent in the proximal right internal carotid artery.  FINDINGS:    The pre-stent right common carotid arteriogram demonstrates mild-to-moderate tortuosity of the right internal carotid artery in the distal cervical segment.  Mild atherosclerotic narrowing is also seen of the cavo-cavernous segment of this vessel.  The post-stent placement right common carotid arteriogram demonstrates no change in the intracranial circulation.  No abnormal filling defects are seen.  IMPRESSION:    Pre and post-stent placement intracranial right common carotid arteriogram demonstrating no intracranial, intraluminal filling defects  or change  in the hemodynamic flow within the right common carotid artery distally.  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          ______________________________ Fritz Pickerel Estanislado Pandy, M.D.     SKD/MEDQ  D:  05/31/2013  T:  06/01/2013  Job:  [id number]

## 2013-06-02 ENCOUNTER — Other Ambulatory Visit: Payer: Self-pay | Admitting: *Deleted

## 2013-06-02 MED ORDER — ATORVASTATIN CALCIUM 80 MG PO TABS: 80.0000 mg | ORAL_TABLET | Freq: Every day | ORAL | Status: DC

## 2013-06-02 NOTE — Telephone Encounter (Signed)
I called Dr Barbette Hair office and spoke with Jackelyn Poling.  She is going to fax over labs.

## 2013-06-02 NOTE — Telephone Encounter (Signed)
Refill for atorvastatin sent in at patient request.  He had blood work done at Dr [employer] office.  Patient will have a copy sent here.

## 2013-06-02 NOTE — Telephone Encounter (Signed)
Spoke with patient,  He requested a refill for atorvastatin.

## 2013-06-08 ENCOUNTER — Ambulatory Visit (HOSPITAL_COMMUNITY)
Admission: RE | Admit: 2013-06-08 | Discharge: 2013-06-08 | Disposition: A | Discharge status: Home / Self Care | Payer: Medicare Other | Source: Ambulatory Visit | Attending: Cardiovascular Disease | Admitting: Cardiovascular Disease

## 2013-06-08 DIAGNOSIS — I739 Peripheral vascular disease, unspecified: Secondary | ICD-10-CM | POA: Insufficient documentation

## 2013-06-08 NOTE — Progress Notes (Signed)
Arterial Duplex Lower Ext. Completed. William Burke, BS, RDMS, RVT  

## 2013-06-14 ENCOUNTER — Encounter: Payer: Self-pay | Admitting: *Deleted

## 2013-07-01 ENCOUNTER — Encounter: Payer: Self-pay | Admitting: Surgery

## 2013-07-04 ENCOUNTER — Ambulatory Visit (INDEPENDENT_AMBULATORY_CARE_PROVIDER_SITE_OTHER): Payer: Self-pay | Admitting: Surgery

## 2013-07-04 ENCOUNTER — Ambulatory Visit (HOSPITAL_COMMUNITY)
Admission: RE | Admit: 2013-07-04 | Discharge: 2013-07-04 | Disposition: A | Discharge status: Home / Self Care | Payer: Medicare Other | Source: Ambulatory Visit | Attending: Surgery | Admitting: Surgery

## 2013-07-04 ENCOUNTER — Encounter: Payer: Self-pay | Admitting: Surgery

## 2013-07-04 VITALS — BP 169/79 | HR 79 | Ht 65.0 in | Wt 194.5 lb

## 2013-07-04 DIAGNOSIS — I6529 Occlusion and stenosis of unspecified carotid artery: Secondary | ICD-10-CM | POA: Insufficient documentation

## 2013-07-04 DIAGNOSIS — Z48812 Encounter for surgical aftercare following surgery on the circulatory system: Secondary | ICD-10-CM | POA: Insufficient documentation

## 2013-07-04 NOTE — Progress Notes (Signed)
The patient is here today for followup.  He is status post right carotid endarterectomy with patch angioplasty and resection with primary anastomosis of a right common carotid artery on 09/02/2012.  This was done for asymptomatic stenosis.  He was found to have an early recurrence and underwent carotid stenting one month ago.  He tolerated the procedure well.  He has no neurologic symptoms.  The patient had an ultrasound today which showed widely patent stents with 40-59% left carotid stenosis.  Patient will get routine followup with Dr. Gwenlyn Found.  He will contact me on an as-needed basis.  He'll continue his Plavix.

## 2013-07-26 ENCOUNTER — Encounter: Payer: Self-pay | Admitting: Cardiovascular Disease

## 2013-07-26 ENCOUNTER — Ambulatory Visit (INDEPENDENT_AMBULATORY_CARE_PROVIDER_SITE_OTHER): Payer: Medicare Other | Admitting: Cardiovascular Disease

## 2013-07-26 VITALS — BP 130/64 | HR 80 | Ht 65.0 in | Wt 196.0 lb

## 2013-07-26 DIAGNOSIS — I779 Disorder of arteries and arterioles, unspecified: Secondary | ICD-10-CM

## 2013-07-26 DIAGNOSIS — I739 Peripheral vascular disease, unspecified: Secondary | ICD-10-CM

## 2013-07-26 DIAGNOSIS — E785 Hyperlipidemia, unspecified: Secondary | ICD-10-CM

## 2013-07-26 DIAGNOSIS — I6529 Occlusion and stenosis of unspecified carotid artery: Secondary | ICD-10-CM

## 2013-07-26 DIAGNOSIS — I10 Essential (primary) hypertension: Secondary | ICD-10-CM

## 2013-07-26 NOTE — Assessment & Plan Note (Signed)
Controlled on current medications 

## 2013-07-26 NOTE — Patient Instructions (Addendum)
Your physician wants you to follow-up in: 1 year with Dr Berry. You will receive a reminder letter in the mail two months in advance. If you don't receive a letter, please call our office to schedule the follow-up appointment.  

## 2013-07-26 NOTE — Assessment & Plan Note (Signed)
Status post right internal carotid artery stenting by myself and Dr. Trula Slade 05/16/13 using distal protection with an excellent angiographic and clinical result. The patient is on aspirin and Plavix.

## 2013-07-26 NOTE — Assessment & Plan Note (Signed)
On statin therapy followed by his PCP 

## 2013-07-26 NOTE — Assessment & Plan Note (Signed)
Status post PTA and stenting of his iliac arteries bilaterally by myself 06/22/07. Followup lower extremity arterial Doppler studies performed office 06/08/13 revealed ABIs of close to 1 bilaterally with mildly increased velocities in both iliac arteries. The patient denies claudication.

## 2013-07-26 NOTE — Progress Notes (Signed)
07/26/2013 William Burke   08/05/1940  [id number]  Primary Physician Delorse Lek, MD Primary Cardiologist: Runell Gess MD [name]   HPI:  The patient is a very pleasant 73 year old mildly to moderately overweight married Caucasian male father of 40, grandfather to 6 grandchildren who I saw a year ago. He has a history of bilateral iliac artery PTA and stenting by myself Jun 22, 2007 using iCAST covered stents for lifestyle-limiting claudication. This resulted in marked improvement in his symptoms on Dopplers. His other problems include non-insulin-requiring diabetes, hypertension and hyperlipidemia as well as remote tobacco abuse. He denies chest pain or shortness of breath. A Myoview performed in April of 2009 was nonischemic. An echo revealed normal LV systolic function with mild concentric LVH. He does have bilateral carotid disease which by duplex ultrasound performed this past June showed progression of disease on both sides. He is neurologically asymptomatic. Recent lower extremity Dopplers performed 06/24/12 showed his stents to be widely patent.carotid Dopplers performed on the same day showed significant progression of disease on the right side now with a hemodynamic significant stenosis. Most recent lab work performed earlier this month revealed a total cholesterol of 157, LDL of 89, and HDL of 38.  He had carotid Dopplers performed 08/06/12 that showed significant progression of disease on the right side now in the critical range. He does complain of some "tingling" in his right cheek. He is intolerant to Plavix because of epistaxis. Dr. Myra Gianotti and I performed right internal carotid artery stenting on Mr. [name] 05/24/13 using distal embolic protection. The angiographic and clinical result excellent. Patient denies chest pain or shortness of breath.    Current Outpatient Prescriptions  Medication Sig Dispense Refill  . aspirin EC 81 MG tablet Take 81 mg by  mouth daily.      [name] atorvastatin (LIPITOR) 80 MG tablet Take 1 tablet (80 mg total) by mouth daily.  90 tablet  0  . b complex vitamins tablet Take 1 tablet by mouth daily.      . cholecalciferol (VITAMIN D) 1000 UNITS tablet Take 1,000 Units by mouth daily.      . Chromium Picolinate 500 MCG CAPS Take 500 mg by mouth daily.       [name] CINNAMON PO Take 1,000 mg by mouth daily.      . clopidogrel (PLAVIX) 75 MG tablet Take 75 mg by mouth daily.      . clopidogrel (PLAVIX) 75 MG tablet Take 1 tablet (75 mg total) by mouth daily.  30 tablet  3  . diltiazem (DILACOR XR) 240 MG 24 hr capsule       . diltiazem (TIAZAC) 240 MG 24 hr capsule Take 240 mg by mouth daily.       . fish oil-omega-3 fatty acids 1000 MG capsule Take 1 g by mouth daily.      . Flaxseed, Linseed, (FLAXSEED OIL) 1000 MG CAPS Take 1 capsule by mouth daily.      . fluticasone (FLONASE) 50 MCG/ACT nasal spray Place 1 spray into both nostrils daily.      . Garlic 1000 MG CAPS Take 2 capsules by mouth daily.      [name] glipiZIDE (GLUCOTROL) 10 MG tablet Take 1 tablet by mouth 2 (two) times daily.      . magnesium gluconate (MAGONATE) 30 MG tablet Take 30 mg by mouth 1 day or 1 dose.      . metFORMIN (GLUCOPHAGE) 1000 MG tablet Take 1,000 mg by  mouth 2 (two) times daily with a meal.      . Misc Natural Products (OSTEO BI-FLEX ADV JOINT SHIELD PO) Take 1 tablet by mouth daily.      . niacin 500 MG tablet Take 500 mg by mouth daily with breakfast.      . OVER THE COUNTER MEDICATION Take 1 tablet by mouth daily. Saw palmeto      . POTASSIUM GLUCONATE PO Take 99 mg by mouth daily.      . quinapril-hydrochlorothiazide (ACCURETIC) 20-12.5 MG per tablet Take 1 tablet by mouth 2 (two) times daily.      [name] terazosin (HYTRIN) 10 MG capsule Take 10 mg by mouth at bedtime.      . vitamin C (ASCORBIC ACID) 500 MG tablet Take 1,000 mg by mouth daily.      [name] zinc gluconate 50 MG tablet Take 50 mg by mouth daily.       No current facility-administered  medications for this visit.    No Active Allergies  History   Social History  . Marital Status: Married    Spouse Name: N/A    Number of Children: N/A  . Years of Education: N/A   Occupational History  . Not on file.   Social History Main Topics  . Smoking status: Former Smoker -- 3.00 packs/day for 25 years    Types: Cigarettes    Quit date: 08/06/1987  . Smokeless tobacco: Never Used  . Alcohol Use: No  . Drug Use: No  . Sexual Activity: Not on file   Other Topics Concern  . Not on file   Social History Narrative  . No narrative on file     Review of Systems: General: negative for chills, fever, night sweats or weight changes.  Cardiovascular: negative for chest pain, dyspnea on exertion, edema, orthopnea, palpitations, paroxysmal nocturnal dyspnea or shortness of breath Dermatological: negative for rash Respiratory: negative for cough or wheezing Urologic: negative for hematuria Abdominal: negative for nausea, vomiting, diarrhea, bright red blood per rectum, melena, or hematemesis Neurologic: negative for visual changes, syncope, or dizziness All other systems reviewed and are otherwise negative except as noted above.    Blood pressure 130/64, pulse 80, height 5\' 5"  (1.651 m), weight 196 lb (88.905 kg).  General appearance: alert and no distress Neck: no adenopathy, no JVD, supple, symmetrical, trachea midline, thyroid not enlarged, symmetric, no tenderness/mass/nodules and soft bilateral carotid bruits Lungs: clear to auscultation bilaterally Heart: regular rate and rhythm, S1, S2 normal, no murmur, click, rub or gallop Extremities: extremities normal, atraumatic, no cyanosis or edema   EKG normal sinus rhythm at 80 with right bundle-branch block left axis deviation  ASSESSMENT AND PLAN:   PAD (peripheral artery disease) Status post PTA and stenting of his iliac arteries bilaterally by myself 06/22/07. Followup lower extremity arterial Doppler studies  performed office 06/08/13 revealed ABIs of close to 1 bilaterally with mildly increased velocities in both iliac arteries. The patient denies claudication.  Occlusion and stenosis of carotid artery without mention of cerebral infarction Status post right internal carotid artery stenting by myself and Dr. Myra Gianotti 05/16/13 using distal protection with an excellent angiographic and clinical result. The patient is on aspirin and Plavix.  Hypertension Controlled on current medications  Hyperlipidemia On statin therapy followed by his PCP      Runell Gess MD [facility], [facility] 07/26/2013 2:13 PM

## 2013-08-27 ENCOUNTER — Other Ambulatory Visit: Payer: Self-pay | Admitting: Surgery

## 2014-01-05 ENCOUNTER — Encounter (HOSPITAL_COMMUNITY): Payer: Self-pay | Admitting: Surgery

## 2014-02-10 ENCOUNTER — Telehealth: Payer: Self-pay | Admitting: Cardiovascular Disease

## 2014-02-10 NOTE — Telephone Encounter (Signed)
Returned call to patient. He has 2 questions/concerns:  1. He has not been contacted to set up his carotid doppler study   >> last doppler 02/2013, ordered for 12/2013 (no recall)  >> informed patient I would notify the scheduler to contact him to set this up - Ebony  >> he inquired about his LEAs and I told him he is due in May 2016(recall in system) 2. He needs a copy of his bills for these doppler studies  >> he states he can get $100 off his cancer policy if he has a copy of his bills  >> he states he has contacted our office about this a few times and has not gotten a response  >> I provided patient with the billing numbers on the carbon copy billing sheet  >> I will forward this message to the billing department to follow up on - Lucy Chris  Patient voiced understanding.

## 2014-02-10 NOTE — Telephone Encounter (Signed)
Please call,says he wanted to talk to you. I asked pt what this was about,said he preferred talking to the nurse.

## 2014-02-10 NOTE — Telephone Encounter (Signed)
Printed a copy of the claim image for the doppler study done 03/21/2013.  Sent to patient with a note to contact [facility] @ [phone] for a copy of their billing.

## 2014-05-26 ENCOUNTER — Encounter: Payer: Self-pay | Admitting: Cardiology

## 2014-06-07 ENCOUNTER — Telehealth: Payer: Self-pay | Admitting: Cardiovascular Disease

## 2014-06-07 NOTE — Telephone Encounter (Signed)
Pt called in concerned about when he is suppose to have his dopplers scheduled. He states that he always has them around this time of year and wanted to know if and when he needs to have them done. Please call  Thanks

## 2014-06-07 NOTE — Telephone Encounter (Signed)
Message sent to scheduling 

## 2014-06-12 ENCOUNTER — Other Ambulatory Visit: Payer: Self-pay | Admitting: Cardiovascular Disease

## 2014-06-12 DIAGNOSIS — I739 Peripheral vascular disease, unspecified: Secondary | ICD-10-CM

## 2014-06-30 ENCOUNTER — Ambulatory Visit (HOSPITAL_COMMUNITY)
Admission: RE | Admit: 2014-06-30 | Discharge: 2014-06-30 | Disposition: A | Discharge status: Home / Self Care | Payer: Medicare Other | Source: Ambulatory Visit | Attending: Cardiovascular Disease | Admitting: Cardiovascular Disease

## 2014-06-30 DIAGNOSIS — I739 Peripheral vascular disease, unspecified: Secondary | ICD-10-CM | POA: Diagnosis not present

## 2014-09-12 ENCOUNTER — Telehealth: Payer: Self-pay | Admitting: Cardiovascular Disease

## 2014-09-12 NOTE — Telephone Encounter (Signed)
Called patient back.   Forwarding to Forest Becker to call patient back to talk to him about his dopplers

## 2014-09-12 NOTE — Telephone Encounter (Signed)
Pt wants Dr Gwenlyn Found nurse to call him. He wants to talk to her about his dopplers.

## 2014-09-18 NOTE — Telephone Encounter (Signed)
Spoke with patient.  He is ready to see Dr Gwenlyn Found regarding his lower extremity dopplers.  Appt made for 10/03/14 with Dr Gwenlyn Found.

## 2014-10-03 ENCOUNTER — Encounter: Payer: Self-pay | Admitting: Cardiovascular Disease

## 2014-10-03 ENCOUNTER — Ambulatory Visit (INDEPENDENT_AMBULATORY_CARE_PROVIDER_SITE_OTHER): Payer: Medicare Other | Admitting: Cardiovascular Disease

## 2014-10-03 VITALS — BP 200/80 | HR 76 | Ht 65.0 in | Wt 190.0 lb

## 2014-10-03 DIAGNOSIS — I6529 Occlusion and stenosis of unspecified carotid artery: Secondary | ICD-10-CM

## 2014-10-03 DIAGNOSIS — I10 Essential (primary) hypertension: Secondary | ICD-10-CM | POA: Diagnosis not present

## 2014-10-03 DIAGNOSIS — I6523 Occlusion and stenosis of bilateral carotid arteries: Secondary | ICD-10-CM

## 2014-10-03 DIAGNOSIS — E785 Hyperlipidemia, unspecified: Secondary | ICD-10-CM

## 2014-10-03 DIAGNOSIS — I739 Peripheral vascular disease, unspecified: Secondary | ICD-10-CM | POA: Diagnosis not present

## 2014-10-03 NOTE — Progress Notes (Signed)
10/03/2014 William Burke   Jul 29, 1940  [id number]  Primary Physician William Lek, MD Primary Cardiologist: William Gess MD William Burke   HPI:  The patient is a very pleasant 74 year old mildly to moderately overweight married Caucasian male father of 92, grandfather to 6 grandchildren who I saw 07/26/13. He has a history of bilateral iliac artery PTA and stenting by myself Jun 22, 2007 using iCAST covered stents for lifestyle-limiting claudication. This resulted in marked improvement in his symptoms on Dopplers. His other problems include non-insulin-requiring diabetes, hypertension and hyperlipidemia as well as remote tobacco abuse. He denies chest pain or shortness of breath. A Myoview performed in April of 2009 was nonischemic. An echo revealed normal LV systolic function with mild concentric LVH. He does have bilateral carotid disease which by duplex ultrasound performed this past June showed progression of disease on both sides. He is neurologically asymptomatic. Recent lower extremity Dopplers performed 06/24/12 showed his stents to be widely patent.carotid Dopplers performed on the same day showed significant progression of disease on the right side now with a hemodynamic significant stenosis. Most recent lab work performed earlier this month revealed a total cholesterol of 157, LDL of 89, and HDL of 38.  He had carotid Dopplers performed 08/06/12 that showed significant progression of disease on the right side now in the critical range. He does complain of some "tingling" in his right cheek. He is intolerant to Plavix because of epistaxis. Dr. Myra Burke and I performed right internal carotid artery stenting on William Burke 05/24/13 using distal embolic protection. The angiographic and clinical result excellent. Patient denies chest pain or shortness of breath or claudication since I saw him a year ago. He is considering having having laparoscopic surgery on his knee.   Current  Outpatient Prescriptions  Medication Sig Dispense Refill  . aspirin EC 81 MG tablet Take 81 mg by mouth daily.    [name] atorvastatin (LIPITOR) 80 MG tablet Take 1 tablet (80 mg total) by mouth daily. 90 tablet 0  . b complex vitamins tablet Take 1 tablet by mouth daily.    . cholecalciferol (VITAMIN D) 1000 UNITS tablet Take 1,000 Units by mouth daily.    . Chromium Picolinate 500 MCG CAPS Take 500 mg by mouth daily.     [name] CINNAMON PO Take 1,000 mg by mouth daily.    . clopidogrel (PLAVIX) 75 MG tablet Take 75 mg by mouth daily.    . clopidogrel (PLAVIX) 75 MG tablet Take 1 tablet (75 mg total) by mouth daily. 30 tablet 3  . clopidogrel (PLAVIX) 75 MG tablet TAKE ONE TABLET DAILY 30 tablet 8  . diltiazem (DILACOR XR) 240 MG 24 hr capsule     . diltiazem (TIAZAC) 240 MG 24 hr capsule Take 240 mg by mouth daily.     . fish oil-omega-3 fatty acids 1000 MG capsule Take 1 g by mouth daily.    . Flaxseed, Linseed, (FLAXSEED OIL) 1000 MG CAPS Take 1 capsule by mouth daily.    . fluticasone (FLONASE) 50 MCG/ACT nasal spray Place 1 spray into both nostrils daily.    . Garlic 1000 MG CAPS Take 2 capsules by mouth daily.    [name] glipiZIDE (GLUCOTROL) 10 MG tablet Take 1 tablet by mouth 2 (two) times daily.    . magnesium gluconate (MAGONATE) 30 MG tablet Take 30 mg by mouth 1 day or 1 dose.    . metFORMIN (GLUCOPHAGE) 1000 MG tablet Take 1,000 mg by mouth 2 (  two) times daily with a meal.    . Misc Natural Products (OSTEO BI-FLEX ADV JOINT SHIELD PO) Take 1 tablet by mouth daily.    . niacin 500 MG tablet Take 500 mg by mouth daily with breakfast.    . OVER THE COUNTER MEDICATION Take 1 tablet by mouth daily. Saw palmeto    . POTASSIUM GLUCONATE PO Take 99 mg by mouth daily.    . quinapril-hydrochlorothiazide (ACCURETIC) 20-12.5 MG per tablet Take 1 tablet by mouth 2 (two) times daily.    [name] terazosin (HYTRIN) 10 MG capsule Take 10 mg by mouth at bedtime.    . vitamin C (ASCORBIC ACID) 500 MG tablet Take 1,000  mg by mouth daily.    [name] zinc gluconate 50 MG tablet Take 50 mg by mouth daily.     No current facility-administered medications for this visit.    No Known Allergies  Social History   Social History  . Marital Status: Married    Spouse Name: N/A  . Number of Children: N/A  . Years of Education: N/A   Occupational History  . Not on file.   Social History Main Topics  . Smoking status: Former Smoker -- 3.00 packs/day for 25 years    Types: Cigarettes    Quit date: 08/06/1987  . Smokeless tobacco: Never Used  . Alcohol Use: No  . Drug Use: No  . Sexual Activity: Not on file   Other Topics Concern  . Not on file   Social History Narrative     Review of Systems: General: negative for chills, fever, night sweats or weight changes.  Cardiovascular: negative for chest pain, dyspnea on exertion, edema, orthopnea, palpitations, paroxysmal nocturnal dyspnea or shortness of breath Dermatological: negative for rash Respiratory: negative for cough or wheezing Urologic: negative for hematuria Abdominal: negative for nausea, vomiting, diarrhea, bright red blood per rectum, melena, or hematemesis Neurologic: negative for visual changes, syncope, or dizziness All other systems reviewed and are otherwise negative except as noted above.    Blood pressure 200/80, pulse 76, height 5\' 5"  (1.651 m), weight 190 lb (86.183 kg).  General appearance: alert and no distress Neck: no adenopathy, no JVD, supple, symmetrical, trachea midline, thyroid not enlarged, symmetric, no tenderness/mass/nodules and ssoft bilateral carotid bruits Lungs: clear to auscultation bilaterally Heart: regular rate and rhythm, S1, S2 normal, no murmur, click, rub or gallop Extremities: extremities normal, atraumatic, no cyanosis or edema Pulses: 2+ and symmetric Skin: Skin color, texture, turgor normal. No rashes or lesions Neurologic: Grossly normal  EKG normal sinus rhythm at 76 with right bundle branch block  and left axis deviation. I personally reviewed this EKG  ASSESSMENT AND PLAN:   PAD (peripheral artery disease) History of peripheral arterial disease status post bilateral iliac artery stenting using I cast stents 06/22/07. His most recent lower extremity arterial Doppler studies performed 06/30/14 revealed a right ABI of 0.67 with a high-frequency signal in the right common iliac artery and a left of 1.0. He does deny claudication. He is scheduled for repeat Dopplers in June 2017.  Hypertension History of hypertension with blood pressure. 200/80. He does state that he rest out of his house because he overslept today and neglected to take his medications. He is on diltiazem, Quinapril  and hydrochlorothiazide. Continue current meds at current dosing  Hyperlipidemia History of hyperlipidemia on atorvastatin 80 mg a day followed by his PCP  Carotid stenosis, asymptomatic History of carotid artery disease status post right internal carotid artery stenting  by myself and Dr. Myra Burke  using distal protection 06/23/13. He had follow-up carotid Dopplers performed in June of last year revealing a widely patent stent. He is neurologically asymptomatic on aspirin and clopidogrel. We will recheck carotid Doppler studies      William Gess MD [facility], [facility] 10/03/2014 9:18 AM

## 2014-10-03 NOTE — Patient Instructions (Addendum)
Your physician wants you to follow-up in: 1 year with Dr Gwenlyn Found. You will receive a reminder letter in the mail two months in advance. If you don't receive a letter, please call our office to schedule the follow-up appointment.   Carotid Duplex- This test is an ultrasound of the carotid arteries in your neck. It looks at blood flow through these arteries that supply the brain with blood. Allow one hour for this exam. There are no restrictions or special instructions.

## 2014-10-03 NOTE — Assessment & Plan Note (Signed)
History of peripheral arterial disease status post bilateral iliac artery stenting using I cast stents 06/22/07. His most recent lower extremity arterial Doppler studies performed 06/30/14 revealed a right ABI of 0.67 with a high-frequency signal in the right common iliac artery and a left of 1.0. He does deny claudication. He is scheduled for repeat Dopplers in June 2017.

## 2014-10-03 NOTE — Assessment & Plan Note (Signed)
History of hypertension with blood pressure. 200/80. He does state that he rest out of his house because he overslept today and neglected to take his medications. He is on diltiazem, Quinapril  and hydrochlorothiazide. Continue current meds at current dosing

## 2014-10-03 NOTE — Assessment & Plan Note (Signed)
History of carotid artery disease status post right internal carotid artery stenting by myself and Dr. Trula Slade  using distal protection 06/23/13. He had follow-up carotid Dopplers performed in June of last year revealing a widely patent stent. He is neurologically asymptomatic on aspirin and clopidogrel. We will recheck carotid Doppler studies

## 2014-10-03 NOTE — Assessment & Plan Note (Signed)
History of hyperlipidemia on atorvastatin 80 mg a day followed by his PCP 

## 2014-10-12 ENCOUNTER — Ambulatory Visit (HOSPITAL_COMMUNITY)
Admission: RE | Admit: 2014-10-12 | Discharge: 2014-10-12 | Disposition: A | Discharge status: Home / Self Care | Payer: Medicare Other | Source: Ambulatory Visit | Attending: Cardiology | Admitting: Cardiology

## 2014-10-12 DIAGNOSIS — E119 Type 2 diabetes mellitus without complications: Secondary | ICD-10-CM | POA: Diagnosis not present

## 2014-10-12 DIAGNOSIS — I10 Essential (primary) hypertension: Secondary | ICD-10-CM | POA: Diagnosis not present

## 2014-10-12 DIAGNOSIS — I6529 Occlusion and stenosis of unspecified carotid artery: Secondary | ICD-10-CM

## 2014-10-12 DIAGNOSIS — I6523 Occlusion and stenosis of bilateral carotid arteries: Secondary | ICD-10-CM | POA: Insufficient documentation

## 2014-10-12 DIAGNOSIS — E785 Hyperlipidemia, unspecified: Secondary | ICD-10-CM | POA: Diagnosis not present

## 2014-11-01 ENCOUNTER — Telehealth: Payer: Self-pay

## 2014-11-01 NOTE — Telephone Encounter (Signed)
Requesting surgical clearance:   1. Type of surgery: Left Knee arthroscopy, Ka-medial OR lateral menisectomy  2. Surgeon: DR. Esmond Plants  3. Surgical date: pending clearance  4. Medications that need to be held: n/a  5. CAD: No     6. I will defer to: Dr. Francene Castle [phone] phone [phone] Fax

## 2014-11-02 NOTE — Telephone Encounter (Signed)
Cleared for his procedure at low risk

## 2014-11-02 NOTE — Telephone Encounter (Signed)
Fax sent Attn: Ilda Basset for pt clearance. [phone] fax; phone [phone]

## 2014-11-30 NOTE — Telephone Encounter (Signed)
Resent fax to [place]: Orson Slick.

## 2014-12-28 NOTE — H&P (Signed)
UNICOMPARTMENTAL KNEE ADMISSION H&P  Patient is being admitted for left medial unicompartmental knee arthroplasty.  Subjective:  Chief Complaint:  Left knee medial compartmental primary OA /pain    HPI: William Burke, 74 y.o. male male, has a history of pain and functional disability in the left and has failed non-surgical conservative treatments for greater than 12 weeks to include NSAID's and/or analgesics, corticosteriod injections, viscosupplementation injections, use of assistive devices and activity modification.  Onset of symptoms was gradual, starting 2+ years ago with gradually worsening course since that time. The patient noted no past surgery on the left knee(s).  Patient currently rates pain in the left knee(s) at 8 out of 10 with activity. Patient has night pain, worsening of pain with activity and weight bearing, pain that interferes with activities of daily living, pain with passive range of motion, crepitus and joint swelling.  Patient has evidence of periarticular osteophytes and joint space narrowing of the medial compartment by imaging studies.  There is no active infection.  Risks, benefits and expectations were discussed with the patient.  Risks including but not limited to the risk of anesthesia, blood clots, nerve damage, blood vessel damage, failure of the prosthesis, infection and up to and including death.  Patient understand the risks, benefits and expectations and wishes to proceed with surgery.   PCP: Stephens Shire, MD  D/C Plans:      Home with HHPT  Post-op Meds:       No Rx given  Tranexamic Acid:      To be given - IV  Decadron:    It is to be given  FYI:     Plavix and ASA  Norco post-op     Past Medical History  Diagnosis Date  . PAD (peripheral artery disease) 06/22/2007    Bilateral iliac artery PTA and stenting with iCAST. Last lower extremity arterial Dopplers 02/25/2011 right ABI 1.0 left ABI 1.1.  Bilateral carotid artery stenosis. Last  carotid Dopplers 07/18/2011: Max ICA stenosis 50-69% bilaterally  . Diabetes mellitus, type 2   . Hypertension   . Hyperlipidemia   . History of tobacco abuse   . Carotid artery disease     status post right carotid artery stenting 05/24/13  . Complication of anesthesia     sensitive to anesthesia ,longer to awaken  . UTI (lower urinary tract infection)   . GERD (gastroesophageal reflux disease)     occ     Past Surgical History  Procedure Laterality Date  . Bowl surgery  1989  . Appendectomy      ruptured as child taken out as adult  . Tonsillectomy    . Wrist surgery Right     pinning  . Cardiac catheterization      aortogram iliac artery stent  . Endarterectomy Right 09/02/2012    Procedure: ENDARTERECTOMY CAROTID with Resection of Common Carotid Artery;  Surgeon: Serafina Mitchell, MD;  Location: [facility];  Service: Vascular;  Laterality: Right;  . Patch angioplasty Right 09/02/2012    Procedure: PATCH ANGIOPLASTY using Vascu-Guard Vascular Patch;  Surgeon: Serafina Mitchell, MD;  Location: [facility];  Service: Vascular;  Laterality: Right;  . Carotid endarterectomy    . Carotid stent Right 05/24/2013  . Carotid stent insertion Right 05/24/2013    Procedure: CAROTID STENT INSERTION;  Surgeon: Serafina Mitchell, MD;  Location: [facility] CATH LAB;  Service: Cardiovascular;  Laterality: Right;    No Known Allergies  Social History  Substance Use Topics  .  Smoking status: Former Smoker -- 3.00 packs/day for 25 years    Types: Cigarettes    Quit date: 08/06/1987  . Smokeless tobacco: Never Used  . Alcohol Use: No    Family History  Problem Relation Age of Onset  . Diabetes Mother   . Hypertension Mother   . Cerebral aneurysm Mother   . Multiple sclerosis Father   . Kidney disease Father      Review of Systems  Constitutional: Negative.   HENT: Negative.   Eyes: Negative.   Respiratory: Negative.   Cardiovascular: Negative.   Gastrointestinal: Positive for heartburn.   Genitourinary: Positive for urgency and frequency.  Musculoskeletal: Positive for joint pain.  Skin: Negative.   Neurological: Negative.   Endo/Heme/Allergies: Negative.   Psychiatric/Behavioral: Negative.      Objective:   Physical Exam  Constitutional: He is oriented to person, place, and time and well-developed, well-nourished, and in no distress.  HENT:  Head: Normocephalic and atraumatic.  Mouth/Throat: He has dentures.  Eyes: Pupils are equal, round, and reactive to light.  Neck: Neck supple. No JVD present. No tracheal deviation present. No thyromegaly present.  Cardiovascular: Normal rate, regular rhythm, normal heart sounds and intact distal pulses.   Pulmonary/Chest: Effort normal and breath sounds normal. No stridor. No respiratory distress. He has no wheezes.  Abdominal: Soft. There is no tenderness. There is no guarding.  Musculoskeletal:       Left knee: He exhibits decreased range of motion, swelling and bony tenderness. He exhibits no ecchymosis, no deformity, no laceration and no erythema. Tenderness found. Medial joint line tenderness noted. No lateral joint line tenderness noted.  Lymphadenopathy:    He has no cervical adenopathy.  Neurological: He is alert and oriented to person, place, and time.  Skin: Skin is warm and dry.  Psychiatric: Affect normal.       Labs:  Estimated body mass index is 31.62 kg/(m^2) as calculated from the following:   Height as of 10/03/14: 5\' 5"  (1.651 m).   Weight as of 10/03/14: 86.183 kg (190 lb).   Imaging Review Plain radiographs demonstrate severe degenerative joint disease of the left knee(s) medial compartment. The overall alignment is neutral. The bone quality appears to be good for age and reported activity level.  Assessment/Plan:  End stage arthritis, left knee medial compartment  The patient history, physical examination, clinical judgment of the provider and imaging studies are consistent with end stage  degenerative joint disease of the left knee(s) and medial unicompartmental knee arthroplasty is deemed medically necessary. The treatment options including medical management, injection therapy arthroscopy and arthroplasty were discussed at length. The risks and benefits of total knee arthroplasty were presented and reviewed. The risks due to aseptic loosening, infection, stiffness, patella tracking problems, thromboembolic complications and other imponderables were discussed. The patient acknowledged the explanation, agreed to proceed with the plan and consent was signed. Patient is being admitted for outpatient / observation treatment for surgery, pain control, PT, OT, prophylactic antibiotics, VTE prophylaxis, progressive ambulation and ADL's and discharge planning. The patient is planning to be discharged home with home health services.    [place] Kayliah Tindol   PA-C  12/28/2014, 9:38 AM

## 2014-12-29 NOTE — Progress Notes (Signed)
10/03/2014-noted in EPIC-EKG. 11/02/2014-Pre-operative clearance from Dr. Adora Fridge on chart. 03/17/2014-Last office visit with CXR results on chart from [facility].

## 2015-01-01 ENCOUNTER — Encounter (HOSPITAL_COMMUNITY)
Admission: RE | Admit: 2015-01-01 | Discharge: 2015-01-01 | Disposition: A | Discharge status: Home / Self Care | Payer: Medicare Other | Source: Ambulatory Visit | Attending: Orthopedic Surgery | Admitting: Orthopedic Surgery

## 2015-01-01 ENCOUNTER — Encounter (HOSPITAL_COMMUNITY): Payer: Self-pay

## 2015-01-01 DIAGNOSIS — M179 Osteoarthritis of knee, unspecified: Secondary | ICD-10-CM | POA: Diagnosis not present

## 2015-01-01 DIAGNOSIS — Z01818 Encounter for other preprocedural examination: Secondary | ICD-10-CM | POA: Insufficient documentation

## 2015-01-01 HISTORY — DX: Unspecified osteoarthritis, unspecified site: M19.90

## 2015-01-01 LAB — BASIC METABOLIC PANEL
ANION GAP: 12 (ref 5–15)
BUN: 14 mg/dL (ref 6–20)
CHLORIDE: 96 mmol/L — AB (ref 101–111)
CO2: 27 mmol/L (ref 22–32)
Calcium: 9.4 mg/dL (ref 8.9–10.3)
Creatinine, Ser: 0.7 mg/dL (ref 0.61–1.24)
GFR calc non Af Amer: >60 mL/min (ref >60)
Glucose, Bld: 151 mg/dL — ABNORMAL HIGH (ref 65–99)
POTASSIUM: 4.1 mmol/L (ref 3.5–5.1)
Sodium: 135 mmol/L (ref 135–145)

## 2015-01-01 LAB — CBC
HEMATOCRIT: 37.4 % — AB (ref 39.0–52.0)
HEMOGLOBIN: 12.2 g/dL — AB (ref 13.0–17.0)
MCH: 28.4 pg (ref 26.0–34.0)
MCHC: 32.6 g/dL (ref 30.0–36.0)
MCV: 87.2 fL (ref 78.0–100.0)
Platelets: 163 10*3/uL (ref 150–400)
RBC: 4.29 MIL/uL (ref 4.22–5.81)
RDW: 15.2 % (ref 11.5–15.5)
WBC: 6.6 10*3/uL (ref 4.0–10.5)

## 2015-01-01 LAB — URINALYSIS, ROUTINE W REFLEX MICROSCOPIC
BILIRUBIN URINE: NEGATIVE
Glucose, UA: NEGATIVE mg/dL
HGB URINE DIPSTICK: NEGATIVE
KETONES UR: NEGATIVE mg/dL
Leukocytes, UA: NEGATIVE
NITRITE: NEGATIVE
Protein, ur: NEGATIVE mg/dL
Specific Gravity, Urine: 1.016 (ref 1.005–1.030)
pH: 5 (ref 5.0–8.0)

## 2015-01-01 LAB — ABO/RH: ABO/RH(D): A POS

## 2015-01-01 LAB — SURGICAL PCR SCREEN
MRSA, PCR: NEGATIVE
STAPHYLOCOCCUS AUREUS: NEGATIVE

## 2015-01-01 LAB — PROTIME-INR
INR: 1.05 (ref 0.00–1.49)
Prothrombin Time: 13.9 seconds (ref 11.6–15.2)

## 2015-01-01 LAB — APTT: aPTT: 30 seconds (ref 24–37)

## 2015-01-01 NOTE — Patient Instructions (Addendum)
William Burke  01/01/2015   Your procedure is scheduled on:   01-08-2015 Monday  Enter through [place] and follow signs to [facility] to [place]. Arrive at      0530  AM ..  (Limit 1 person with you).  Call this number if you have problems the morning of surgery: [phone]  Or Presurgical Testing [phone] days before.   For Living Will and/or Health Care Power Attorney Forms: please provide copy for your medical record,may bring AM of surgery(Forms should be already notarized -we do not provide this service).(01-01-15 Yes/ information preferred not to bring at this time).     Do not eat food/ or drink: After Midnight.      Take these medicines the morning of surgery with A SIP OF WATER-   (DO NOT TAKE ANY DIABETIC MEDS AM OF SURGERY) : Omeprazole.   Do not wear jewelry, make-up or nail polish.  Do not wear deodorant, lotions, powders, or perfumes.   Do not shave legs and under arms- 48 hours(2 days) prior to first CHG shower.(Shaving face and neck okay.)  Do not bring valuables to the hospital.(Hospital is not responsible for lost valuables).  Contacts, dentures or removable bridgework, body piercing, hair pins may not be worn into surgery.  Leave suitcase in the car. After surgery it may be brought to your room.  For patients admitted to the hospital, checkout time is 11:00 AM the day of discharge.(Restricted visitors-Any Persons displaying flu-like symptoms or illness).    Patients discharged the day of surgery will not be allowed to drive home. Must have responsible person with you x 24 hours once discharged.  Name and phone number of your driver: [name] -spouse -[phone] cell     Please read over the following fact sheets that you were given:  CHG(Chlorhexidine Gluconate 4% Surgical Soap) use, MRSA Information, Blood Transfusion fact sheet, Incentive Spirometry Instruction.  Remember : Type/Screen "Blue armbands" - may not  be removed once applied(would result in being retested AM of surgery, if removed).         [facility] - Preparing for Surgery Before surgery, you can play an important role.  Because skin is not sterile, your skin needs to be as free of germs as possible.  You can reduce the number of germs on your skin by washing with CHG (chlorahexidine gluconate) soap before surgery.  CHG is an antiseptic cleaner which kills germs and bonds with the skin to continue killing germs even after washing. Please DO NOT use if you have an allergy to CHG or antibacterial soaps.  If your skin becomes reddened/irritated stop using the CHG and inform your nurse when you arrive at Short Stay. Do not shave (including legs and underarms) for at least 48 hours prior to the first CHG shower.  You may shave your face/neck. Please follow these instructions carefully:  1.  Shower with CHG Soap the night before surgery and the  morning of Surgery.  2.  If you choose to wash your hair, wash your hair first as usual with your  normal  shampoo.  3.  After you shampoo, rinse your hair and body thoroughly to remove the  shampoo.                           4.  Use CHG as you would any other liquid soap.  You can apply chg directly  to the skin and wash                       Gently with a scrungie or clean washcloth.  5.  Apply the CHG Soap to your body ONLY FROM THE NECK DOWN.   Do not use on face/ open                           Wound or open sores. Avoid contact with eyes, ears mouth and genitals (private parts).                       Wash face,  Genitals (private parts) with your normal soap.             6.  Wash thoroughly, paying special attention to the area where your surgery  will be performed.  7.  Thoroughly rinse your body with warm water from the neck down.  8.  DO NOT shower/wash with your normal soap after using and rinsing off  the CHG Soap.                9.  Pat yourself dry with a clean towel.            10.  Wear  clean pajamas.            11.  Place clean sheets on your bed the night of your first shower and do not  sleep with pets. Day of Surgery : Do not apply any lotions/deodorants the morning of surgery.  Please wear clean clothes to the hospital/surgery center.  FAILURE TO FOLLOW THESE INSTRUCTIONS MAY RESULT IN THE CANCELLATION OF YOUR SURGERY PATIENT SIGNATURE_________________________________  NURSE SIGNATURE__________________________________  ________________________________________________________________________   William Burke  An incentive spirometer is a tool that can help keep your lungs clear and active. This tool measures how well you are filling your lungs with each breath. Taking long deep breaths may help reverse or decrease the chance of developing breathing (pulmonary) problems (especially infection) following:  A long period of time when you are unable to move or be active. BEFORE THE PROCEDURE   If the spirometer includes an indicator to show your best effort, your nurse or respiratory therapist will set it to a desired goal.  If possible, sit up straight or lean slightly forward. Try not to slouch.  Hold the incentive spirometer in an upright position. INSTRUCTIONS FOR USE   Sit on the edge of your bed if possible, or sit up as far as you can in bed or on a chair.  Hold the incentive spirometer in an upright position.  Breathe out normally.  Place the mouthpiece in your mouth and seal your lips tightly around it.  Breathe in slowly and as deeply as possible, raising the piston or the ball toward the top of the column.  Hold your breath for 3-5 seconds or for as long as possible. Allow the piston or ball to fall to the bottom of the column.  Remove the mouthpiece from your mouth and breathe out normally.  Rest for a few seconds and repeat Steps 1 through 7 at least 10 times every 1-2 hours when you are awake. Take your time and take a few normal breaths  between deep breaths.  The spirometer may include an indicator to show your best effort. Use the indicator as a goal to work toward during each repetition.  After  each set of 10 deep breaths, practice coughing to be sure your lungs are clear. If you have an incision (the cut made at the time of surgery), support your incision when coughing by placing a pillow or rolled up towels firmly against it. Once you are able to get out of bed, walk around indoors and cough well. You may stop using the incentive spirometer when instructed by your caregiver.  RISKS AND COMPLICATIONS  Take your time so you do not get dizzy or light-headed.  If you are in pain, you may need to take or ask for pain medication before doing incentive spirometry. It is harder to take a deep breath if you are having pain. AFTER USE  Rest and breathe slowly and easily.  It can be helpful to keep track of a log of your progress. Your caregiver can provide you with a simple table to help with this. If you are using the spirometer at home, follow these instructions: [place] IF:   You are having difficultly using the spirometer.  You have trouble using the spirometer as often as instructed.  Your pain medication is not giving enough relief while using the spirometer.  You develop fever of 100.5 F (38.1 C) or higher. SEEK IMMEDIATE MEDICAL CARE IF:   You cough up bloody sputum that had not been present before.  You develop fever of 102 F (38.9 C) or greater.  You develop worsening pain at or near the incision site. MAKE SURE YOU:   Understand these instructions.  Will watch your condition.  Will get help right away if you are not doing well or get worse. Document Released: 05/26/2006 Document Revised: 04/07/2011 Document Reviewed: 07/27/2006 ExitCare Patient Information 2014 Exit[employer], [place].   ________________________________________________________________________  WHAT IS A BLOOD TRANSFUSION?  Blood Transfusion Information  A transfusion is the replacement of blood or some of its parts. Blood is made up of multiple cells which provide different functions.  Red blood cells carry oxygen and are used for blood loss replacement.  White blood cells fight against infection.  Platelets control bleeding.  Plasma helps clot blood.  Other blood products are available for specialized needs, such as hemophilia or other clotting disorders. BEFORE THE TRANSFUSION  Who gives blood for transfusions?   Healthy volunteers who are fully evaluated to make sure their blood is safe. This is blood bank blood. Transfusion therapy is the safest it has ever been in the practice of medicine. Before blood is taken from a donor, a complete history is taken to make sure that person has no history of diseases nor engages in risky social behavior (examples are intravenous drug use or sexual activity with multiple partners). The donor's travel history is screened to minimize risk of transmitting infections, such as malaria. The donated blood is tested for signs of infectious diseases, such as HIV and hepatitis. The blood is then tested to be sure it is compatible with you in order to minimize the chance of a transfusion reaction. If you or a relative donates blood, this is often done in anticipation of surgery and is not appropriate for emergency situations. It takes many days to process the donated blood. RISKS AND COMPLICATIONS Although transfusion therapy is very safe and saves many lives, the main dangers of transfusion include:   Getting an infectious disease.  Developing a transfusion reaction. This is an allergic reaction to something in the blood you were given. Every precaution is taken to prevent this. The decision to have  a blood transfusion has been considered carefully by your caregiver before blood is given. Blood is not given unless the benefits outweigh the risks. AFTER THE TRANSFUSION  Right  after receiving a blood transfusion, you will usually feel much better and more energetic. This is especially true if your red blood cells have gotten low (anemic). The transfusion raises the level of the red blood cells which carry oxygen, and this usually causes an energy increase.  The nurse administering the transfusion will monitor you carefully for complications. HOME CARE INSTRUCTIONS  No special instructions are needed after a transfusion. You may find your energy is better. Speak with your caregiver about any limitations on activity for underlying diseases you may have. SEEK MEDICAL CARE IF:   Your condition is not improving after your transfusion.  You develop redness or irritation at the intravenous (IV) site. SEEK IMMEDIATE MEDICAL CARE IF:  Any of the following symptoms occur over the next 12 hours:  Shaking chills.  You have a temperature by mouth above 102 F (38.9 C), not controlled by medicine.  Chest, back, or muscle pain.  People around you feel you are not acting correctly or are confused.  Shortness of breath or difficulty breathing.  Dizziness and fainting.  You get a rash or develop hives.  You have a decrease in urine output.  Your urine turns a dark color or changes to pink, red, or brown. Any of the following symptoms occur over the next 10 days:  You have a temperature by mouth above 102 F (38.9 C), not controlled by medicine.  Shortness of breath.  Weakness after normal activity.  The white part of the eye turns yellow (jaundice).  You have a decrease in the amount of urine or are urinating less often.  Your urine turns a dark color or changes to pink, red, or brown. Document Released: 01/11/2000 Document Revised: 04/07/2011 Document Reviewed: 08/30/2007 [facility] Patient Information 2014 [place], [place].  _______________________________________________________________________

## 2015-01-01 NOTE — Pre-Procedure Instructions (Signed)
EKG 10-03-14 Epic. Cardiac clearance note(Dr. Gwenlyn Found) with chart. CXR 03-17-14 result with chart.

## 2015-01-02 LAB — HEMOGLOBIN A1C
Hgb A1c MFr Bld: 8.2 % — ABNORMAL HIGH (ref 4.8–5.6)
Mean Plasma Glucose: 189 mg/dL

## 2015-01-07 NOTE — Anesthesia Preprocedure Evaluation (Addendum)
Anesthesia Evaluation  Patient identified by MRN, date of birth, ID band Patient awake    Reviewed: Allergy & Precautions, NPO status , Patient's Chart, lab work & pertinent test results  Airway Mallampati: I  TM Distance: >3 FB Neck ROM: Full    Dental  (+) Teeth Intact   Pulmonary former smoker,    breath sounds clear to auscultation       Cardiovascular hypertension, Pt. on medications + Peripheral Vascular Disease   Rhythm:Regular Rate:Normal     Neuro/Psych negative neurological ROS  negative psych ROS   GI/Hepatic Neg liver ROS, GERD  Medicated,  Endo/Other  diabetes, Type 2, Oral Hypoglycemic Agents  Renal/GU negative Renal ROS  negative genitourinary   Musculoskeletal  (+) Arthritis , Osteoarthritis,    Abdominal   Peds negative pediatric ROS (+)  Hematology negative hematology ROS (+)   Anesthesia Other Findings - Carotid Artery Stenosis s/p stenting  Reproductive/Obstetrics negative OB ROS                           Lab Results  Component Value Date   WBC 6.6 01/01/2015   HGB 12.2* 01/01/2015   HCT 37.4* 01/01/2015   MCV 87.2 01/01/2015   PLT 163 01/01/2015   Lab Results  Component Value Date   INR 1.05 01/01/2015   INR 1.01 08/30/2012   EKG: NSR with RBBB  Perfusion Test Exercise Capacity: Lexiscan with no exercise. BP Response: Normal blood pressure response. Clinical Symptoms: No significant symptoms noted. ECG Impression: No significant ST segment change suggestive of ischemia. Comparison with Prior Nuclear Study: No significant change from previous study  Overall Impression: Normal stress nuclear study.  LV Wall Motion: NL LV Function; NL Wall Motion   Anesthesia Physical Anesthesia Plan  ASA: III  Anesthesia Plan: Spinal   Post-op Pain Management:    Induction: Intravenous  Airway Management Planned: Natural Airway  Additional Equipment:    Intra-op Plan:   Post-operative Plan:   Informed Consent: I have reviewed the patients History and Physical, chart, labs and discussed the procedure including the risks, benefits and alternatives for the proposed anesthesia with the patient or authorized representative who has indicated his/her understanding and acceptance.   Dental advisory given  Plan Discussed with: CRNA  Anesthesia Plan Comments: (Mr. Pasqualone held Plavix for 10 days. Will proceed with spinal.)       Anesthesia Quick Evaluation

## 2015-01-08 ENCOUNTER — Ambulatory Visit (HOSPITAL_COMMUNITY): Payer: Medicare Other | Admitting: Anesthesiology

## 2015-01-08 ENCOUNTER — Encounter (HOSPITAL_COMMUNITY): Payer: Self-pay

## 2015-01-08 ENCOUNTER — Observation Stay (HOSPITAL_COMMUNITY)
Admission: RE | Admit: 2015-01-08 | Discharge: 2015-01-09 | Disposition: A | Discharge status: Home / Self Care | Payer: Medicare Other | Source: Ambulatory Visit | Attending: Orthopedic Surgery | Admitting: Orthopedic Surgery

## 2015-01-08 ENCOUNTER — Encounter (HOSPITAL_COMMUNITY)
Admission: RE | Disposition: A | Discharge status: Home / Self Care | Payer: Self-pay | Source: Ambulatory Visit | Attending: Orthopedic Surgery

## 2015-01-08 DIAGNOSIS — Z7984 Long term (current) use of oral hypoglycemic drugs: Secondary | ICD-10-CM | POA: Diagnosis not present

## 2015-01-08 DIAGNOSIS — E785 Hyperlipidemia, unspecified: Secondary | ICD-10-CM | POA: Diagnosis not present

## 2015-01-08 DIAGNOSIS — Z955 Presence of coronary angioplasty implant and graft: Secondary | ICD-10-CM | POA: Insufficient documentation

## 2015-01-08 DIAGNOSIS — M1712 Unilateral primary osteoarthritis, left knee: Principal | ICD-10-CM | POA: Insufficient documentation

## 2015-01-08 DIAGNOSIS — M25762 Osteophyte, left knee: Secondary | ICD-10-CM | POA: Diagnosis not present

## 2015-01-08 DIAGNOSIS — Z96652 Presence of left artificial knee joint: Secondary | ICD-10-CM

## 2015-01-08 DIAGNOSIS — I2510 Atherosclerotic heart disease of native coronary artery without angina pectoris: Secondary | ICD-10-CM | POA: Insufficient documentation

## 2015-01-08 DIAGNOSIS — I10 Essential (primary) hypertension: Secondary | ICD-10-CM | POA: Insufficient documentation

## 2015-01-08 DIAGNOSIS — Z87891 Personal history of nicotine dependence: Secondary | ICD-10-CM | POA: Diagnosis not present

## 2015-01-08 DIAGNOSIS — E1151 Type 2 diabetes mellitus with diabetic peripheral angiopathy without gangrene: Secondary | ICD-10-CM | POA: Diagnosis not present

## 2015-01-08 DIAGNOSIS — K219 Gastro-esophageal reflux disease without esophagitis: Secondary | ICD-10-CM | POA: Insufficient documentation

## 2015-01-08 DIAGNOSIS — Z96659 Presence of unspecified artificial knee joint: Secondary | ICD-10-CM

## 2015-01-08 HISTORY — PX: PARTIAL KNEE ARTHROPLASTY: SHX2174

## 2015-01-08 LAB — TYPE AND SCREEN
ABO/RH(D): A POS
ANTIBODY SCREEN: NEGATIVE

## 2015-01-08 LAB — GLUCOSE, CAPILLARY
GLUCOSE-CAPILLARY: 164 mg/dL — AB (ref 65–99)
GLUCOSE-CAPILLARY: 251 mg/dL — AB (ref 65–99)
GLUCOSE-CAPILLARY: 299 mg/dL — AB (ref 65–99)
Glucose-Capillary: 264 mg/dL — ABNORMAL HIGH (ref 65–99)

## 2015-01-08 SURGERY — ARTHROPLASTY, KNEE, UNICOMPARTMENTAL
Anesthesia: Spinal | Site: Knee | Laterality: Left

## 2015-01-08 MED ORDER — FERROUS SULFATE 325 (65 FE) MG PO TABS
325.0000 mg | ORAL_TABLET | Freq: Three times a day (TID) | ORAL | Status: DC
  Administered 2015-01-08 – 2015-01-09 (×2): 325 mg via ORAL
  Filled 2015-01-08 (×6): qty 1

## 2015-01-08 MED ORDER — SODIUM CHLORIDE 0.9 % IV SOLN
INTRAVENOUS | Status: DC
  Administered 2015-01-08 (×2): via INTRAVENOUS
  Filled 2015-01-08 (×5): qty 1000

## 2015-01-08 MED ORDER — GLIPIZIDE 10 MG PO TABS
10.0000 mg | ORAL_TABLET | Freq: Every day | ORAL | Status: DC
  Administered 2015-01-09: 10 mg via ORAL
  Filled 2015-01-08 (×2): qty 1

## 2015-01-08 MED ORDER — POLYETHYLENE GLYCOL 3350 17 G PO PACK: 17.0000 g | PACK | Freq: Two times a day (BID) | ORAL | Status: DC

## 2015-01-08 MED ORDER — BUPIVACAINE-EPINEPHRINE 0.25% -1:200000 IJ SOLN
INTRAMUSCULAR | Status: DC | PRN
  Administered 2015-01-08: 30 mL

## 2015-01-08 MED ORDER — BUPIVACAINE IN DEXTROSE 0.75-8.25 % IT SOLN
INTRATHECAL | Status: DC | PRN
  Administered 2015-01-08: 1.8 mL via INTRATHECAL

## 2015-01-08 MED ORDER — DIPHENHYDRAMINE HCL 25 MG PO CAPS: 25.0000 mg | ORAL_CAPSULE | Freq: Four times a day (QID) | ORAL | Status: DC | PRN

## 2015-01-08 MED ORDER — TERAZOSIN HCL 5 MG PO CAPS
10.0000 mg | ORAL_CAPSULE | Freq: Every day | ORAL | Status: DC
  Administered 2015-01-08: 10 mg via ORAL
  Filled 2015-01-08 (×2): qty 2

## 2015-01-08 MED ORDER — FENTANYL CITRATE (PF) 100 MCG/2ML IJ SOLN
INTRAMUSCULAR | Status: AC
  Filled 2015-01-08: qty 2

## 2015-01-08 MED ORDER — ASPIRIN EC 325 MG PO TBEC
325.0000 mg | DELAYED_RELEASE_TABLET | Freq: Every day | ORAL | Status: DC
  Administered 2015-01-09: 325 mg via ORAL
  Filled 2015-01-08 (×2): qty 1

## 2015-01-08 MED ORDER — PROMETHAZINE HCL 25 MG/ML IJ SOLN: 6.2500 mg | INTRAMUSCULAR | Status: DC | PRN

## 2015-01-08 MED ORDER — KETOROLAC TROMETHAMINE 30 MG/ML IJ SOLN
INTRAMUSCULAR | Status: AC
  Filled 2015-01-08: qty 1

## 2015-01-08 MED ORDER — HYDROMORPHONE HCL 1 MG/ML IJ SOLN: 0.5000 mg | INTRAMUSCULAR | Status: DC | PRN

## 2015-01-08 MED ORDER — ATORVASTATIN CALCIUM 40 MG PO TABS: 40.0000 mg | ORAL_TABLET | Freq: Every day | ORAL | Status: DC

## 2015-01-08 MED ORDER — ROSUVASTATIN CALCIUM 20 MG PO TABS
20.0000 mg | ORAL_TABLET | Freq: Every day | ORAL | Status: DC
  Administered 2015-01-08: 20 mg via ORAL
  Filled 2015-01-08 (×2): qty 1

## 2015-01-08 MED ORDER — CHLORHEXIDINE GLUCONATE 4 % EX LIQD: 60.0000 mL | Freq: Once | CUTANEOUS | Status: DC

## 2015-01-08 MED ORDER — HYDROCODONE-ACETAMINOPHEN 7.5-325 MG PO TABS
1.0000 | ORAL_TABLET | ORAL | Status: DC
  Administered 2015-01-08 – 2015-01-09 (×3): 1 via ORAL
  Filled 2015-01-08 (×5): qty 1

## 2015-01-08 MED ORDER — DILTIAZEM HCL ER BEADS 240 MG PO CP24
240.0000 mg | ORAL_CAPSULE | Freq: Every day | ORAL | Status: DC
  Administered 2015-01-08 – 2015-01-09 (×2): 240 mg via ORAL
  Filled 2015-01-08 (×2): qty 1

## 2015-01-08 MED ORDER — LACTATED RINGERS IV SOLN: INTRAVENOUS | Status: DC

## 2015-01-08 MED ORDER — DOCUSATE SODIUM 100 MG PO CAPS
100.0000 mg | ORAL_CAPSULE | Freq: Two times a day (BID) | ORAL | Status: DC
  Administered 2015-01-08: 100 mg via ORAL

## 2015-01-08 MED ORDER — LIDOCAINE HCL (CARDIAC) 20 MG/ML IV SOLN
INTRAVENOUS | Status: DC | PRN
  Administered 2015-01-08: 30 mg via INTRAVENOUS

## 2015-01-08 MED ORDER — PHENOL 1.4 % MT LIQD
1.0000 | OROMUCOSAL | Status: DC | PRN
  Filled 2015-01-08: qty 177

## 2015-01-08 MED ORDER — ONDANSETRON HCL 4 MG/2ML IJ SOLN
INTRAMUSCULAR | Status: DC | PRN
  Administered 2015-01-08: 4 mg via INTRAVENOUS

## 2015-01-08 MED ORDER — PROPOFOL 10 MG/ML IV BOLUS
INTRAVENOUS | Status: AC
  Filled 2015-01-08: qty 40

## 2015-01-08 MED ORDER — ONDANSETRON HCL 4 MG/2ML IJ SOLN: 4.0000 mg | Freq: Four times a day (QID) | INTRAMUSCULAR | Status: DC | PRN

## 2015-01-08 MED ORDER — METHOCARBAMOL 1000 MG/10ML IJ SOLN
500.0000 mg | Freq: Four times a day (QID) | INTRAVENOUS | Status: DC | PRN
  Filled 2015-01-08: qty 5

## 2015-01-08 MED ORDER — CELECOXIB 200 MG PO CAPS
200.0000 mg | ORAL_CAPSULE | Freq: Two times a day (BID) | ORAL | Status: DC
  Administered 2015-01-08 – 2015-01-09 (×2): 200 mg via ORAL
  Filled 2015-01-08 (×4): qty 1

## 2015-01-08 MED ORDER — METOCLOPRAMIDE HCL 10 MG PO TABS: 5.0000 mg | ORAL_TABLET | Freq: Three times a day (TID) | ORAL | Status: DC | PRN

## 2015-01-08 MED ORDER — BUPIVACAINE-EPINEPHRINE (PF) 0.25% -1:200000 IJ SOLN
INTRAMUSCULAR | Status: AC
  Filled 2015-01-08: qty 30

## 2015-01-08 MED ORDER — PROPOFOL 500 MG/50ML IV EMUL
INTRAVENOUS | Status: DC | PRN
  Administered 2015-01-08: 50 ug/kg/min via INTRAVENOUS

## 2015-01-08 MED ORDER — 0.9 % SODIUM CHLORIDE (POUR BTL) OPTIME
TOPICAL | Status: DC | PRN
  Administered 2015-01-08: 1000 mL

## 2015-01-08 MED ORDER — CEFAZOLIN SODIUM-DEXTROSE 2-3 GM-% IV SOLR
INTRAVENOUS | Status: AC
  Filled 2015-01-08: qty 50

## 2015-01-08 MED ORDER — DEXAMETHASONE SODIUM PHOSPHATE 10 MG/ML IJ SOLN: 10.0000 mg | Freq: Once | INTRAMUSCULAR | Status: DC

## 2015-01-08 MED ORDER — ALUM & MAG HYDROXIDE-SIMETH 200-200-20 MG/5ML PO SUSP: 30.0000 mL | ORAL | Status: DC | PRN

## 2015-01-08 MED ORDER — FLUTICASONE PROPIONATE 50 MCG/ACT NA SUSP
1.0000 | Freq: Every day | NASAL | Status: DC
  Filled 2015-01-08: qty 16

## 2015-01-08 MED ORDER — SODIUM CHLORIDE 0.9 % IJ SOLN
INTRAMUSCULAR | Status: DC | PRN
  Administered 2015-01-08: 30 mL

## 2015-01-08 MED ORDER — BISACODYL 10 MG RE SUPP: 10.0000 mg | Freq: Every day | RECTAL | Status: DC | PRN

## 2015-01-08 MED ORDER — MEPERIDINE HCL 50 MG/ML IJ SOLN: 6.2500 mg | INTRAMUSCULAR | Status: DC | PRN

## 2015-01-08 MED ORDER — MIDAZOLAM HCL 5 MG/5ML IJ SOLN
INTRAMUSCULAR | Status: DC | PRN
  Administered 2015-01-08 (×2): 1 mg via INTRAVENOUS

## 2015-01-08 MED ORDER — NIACIN 500 MG PO TABS
500.0000 mg | ORAL_TABLET | Freq: Every day | ORAL | Status: DC
  Administered 2015-01-09: 500 mg via ORAL
  Filled 2015-01-08 (×2): qty 1

## 2015-01-08 MED ORDER — FENTANYL CITRATE (PF) 100 MCG/2ML IJ SOLN: 25.0000 ug | INTRAMUSCULAR | Status: DC | PRN

## 2015-01-08 MED ORDER — PROPOFOL 10 MG/ML IV BOLUS
INTRAVENOUS | Status: DC | PRN
  Administered 2015-01-08: 20 mg via INTRAVENOUS

## 2015-01-08 MED ORDER — NON FORMULARY: 20.0000 mg | Freq: Two times a day (BID) | Status: DC

## 2015-01-08 MED ORDER — SODIUM CHLORIDE 0.9 % IJ SOLN
INTRAMUSCULAR | Status: AC
  Filled 2015-01-08: qty 50

## 2015-01-08 MED ORDER — TRANEXAMIC ACID 1000 MG/10ML IV SOLN
1000.0000 mg | Freq: Once | INTRAVENOUS | Status: AC
  Administered 2015-01-08: 1000 mg via INTRAVENOUS
  Filled 2015-01-08: qty 10

## 2015-01-08 MED ORDER — OMEPRAZOLE 20 MG PO CPDR
20.0000 mg | DELAYED_RELEASE_CAPSULE | Freq: Two times a day (BID) | ORAL | Status: DC
  Administered 2015-01-08 – 2015-01-09 (×2): 20 mg via ORAL
  Filled 2015-01-08 (×4): qty 1

## 2015-01-08 MED ORDER — MIDAZOLAM HCL 2 MG/2ML IJ SOLN
INTRAMUSCULAR | Status: AC
  Filled 2015-01-08: qty 2

## 2015-01-08 MED ORDER — ONDANSETRON HCL 4 MG/2ML IJ SOLN
INTRAMUSCULAR | Status: AC
  Filled 2015-01-08: qty 2

## 2015-01-08 MED ORDER — INSULIN ASPART 100 UNIT/ML ~~LOC~~ SOLN: 0.0000 [IU] | Freq: Three times a day (TID) | SUBCUTANEOUS | Status: DC

## 2015-01-08 MED ORDER — DEXAMETHASONE SODIUM PHOSPHATE 10 MG/ML IJ SOLN
INTRAMUSCULAR | Status: AC
  Filled 2015-01-08: qty 1

## 2015-01-08 MED ORDER — CEFAZOLIN SODIUM-DEXTROSE 2-3 GM-% IV SOLR
2.0000 g | Freq: Four times a day (QID) | INTRAVENOUS | Status: AC
  Administered 2015-01-08 (×2): 2 g via INTRAVENOUS
  Filled 2015-01-08 (×3): qty 50

## 2015-01-08 MED ORDER — MAGNESIUM CITRATE PO SOLN: 1.0000 | Freq: Once | ORAL | Status: DC | PRN

## 2015-01-08 MED ORDER — ONDANSETRON HCL 4 MG PO TABS: 4.0000 mg | ORAL_TABLET | Freq: Four times a day (QID) | ORAL | Status: DC | PRN

## 2015-01-08 MED ORDER — FENTANYL CITRATE (PF) 100 MCG/2ML IJ SOLN
INTRAMUSCULAR | Status: DC | PRN
  Administered 2015-01-08 (×2): 50 ug via INTRAVENOUS

## 2015-01-08 MED ORDER — CLOPIDOGREL BISULFATE 75 MG PO TABS
75.0000 mg | ORAL_TABLET | Freq: Every day | ORAL | Status: DC
  Administered 2015-01-09: 75 mg via ORAL
  Filled 2015-01-08 (×2): qty 1

## 2015-01-08 MED ORDER — DEXAMETHASONE SODIUM PHOSPHATE 10 MG/ML IJ SOLN
10.0000 mg | Freq: Once | INTRAMUSCULAR | Status: AC
  Administered 2015-01-08: 10 mg via INTRAVENOUS

## 2015-01-08 MED ORDER — LACTATED RINGERS IV SOLN
INTRAVENOUS | Status: DC | PRN
  Administered 2015-01-08 (×2): via INTRAVENOUS

## 2015-01-08 MED ORDER — METFORMIN HCL 500 MG PO TABS
1000.0000 mg | ORAL_TABLET | Freq: Two times a day (BID) | ORAL | Status: DC
  Administered 2015-01-09: 1000 mg via ORAL
  Filled 2015-01-08 (×4): qty 2

## 2015-01-08 MED ORDER — KETOROLAC TROMETHAMINE 30 MG/ML IJ SOLN
INTRAMUSCULAR | Status: DC | PRN
  Administered 2015-01-08: 30 mg

## 2015-01-08 MED ORDER — MENTHOL 3 MG MT LOZG: 1.0000 | LOZENGE | OROMUCOSAL | Status: DC | PRN

## 2015-01-08 MED ORDER — METOCLOPRAMIDE HCL 5 MG/ML IJ SOLN: 5.0000 mg | Freq: Three times a day (TID) | INTRAMUSCULAR | Status: DC | PRN

## 2015-01-08 MED ORDER — CEFAZOLIN SODIUM-DEXTROSE 2-3 GM-% IV SOLR
2.0000 g | INTRAVENOUS | Status: AC
  Administered 2015-01-08: 2 g via INTRAVENOUS

## 2015-01-08 MED ORDER — LINAGLIPTIN 5 MG PO TABS
5.0000 mg | ORAL_TABLET | Freq: Every day | ORAL | Status: DC
  Administered 2015-01-08 – 2015-01-09 (×2): 5 mg via ORAL
  Filled 2015-01-08 (×2): qty 1

## 2015-01-08 MED ORDER — METHOCARBAMOL 500 MG PO TABS
500.0000 mg | ORAL_TABLET | Freq: Four times a day (QID) | ORAL | Status: DC | PRN
  Administered 2015-01-08 – 2015-01-09 (×3): 500 mg via ORAL
  Filled 2015-01-08 (×3): qty 1

## 2015-01-08 SURGICAL SUPPLY — 38 items
BAG DECANTER FOR FLEXI CONT (MISCELLANEOUS) IMPLANT
BAG ZIPLOCK 12X15 (MISCELLANEOUS) IMPLANT
BANDAGE ELASTIC 6 VELCRO ST LF (GAUZE/BANDAGES/DRESSINGS) ×2 IMPLANT
BLADE SAW RECIPROCATING 77.5 (BLADE) ×2 IMPLANT
BLADE SAW SGTL 13.0X1.19X90.0M (BLADE) ×2 IMPLANT
BONE CEMENT GENTAMICIN (Cement) ×2 IMPLANT
BOWL SMART MIX CTS (DISPOSABLE) ×2 IMPLANT
CAPT KNEE PARTIAL 2 ×2 IMPLANT
CEMENT BONE GENTAMICIN 40 (Cement) ×1 IMPLANT
CLOTH BEACON ORANGE TIMEOUT ST (SAFETY) ×2 IMPLANT
CUFF TOURN SGL QUICK 34 (TOURNIQUET CUFF) ×1
CUFF TRNQT CYL 34X4X40X1 (TOURNIQUET CUFF) ×1 IMPLANT
DRAPE U-SHAPE 47X51 STRL (DRAPES) ×2 IMPLANT
DRSG AQUACEL AG ADV 3.5X10 (GAUZE/BANDAGES/DRESSINGS) ×2 IMPLANT
DURAPREP 26ML APPLICATOR (WOUND CARE) ×4 IMPLANT
ELECT REM PT RETURN 9FT ADLT (ELECTROSURGICAL) ×2
ELECTRODE REM PT RTRN 9FT ADLT (ELECTROSURGICAL) ×1 IMPLANT
GLOVE BIOGEL M 7.0 STRL (GLOVE) IMPLANT
GLOVE BIOGEL PI IND STRL 7.5 (GLOVE) ×1 IMPLANT
GLOVE BIOGEL PI IND STRL 8.5 (GLOVE) ×1 IMPLANT
GLOVE BIOGEL PI INDICATOR 7.5 (GLOVE) ×1
GLOVE BIOGEL PI INDICATOR 8.5 (GLOVE) ×1
GLOVE ECLIPSE 8.0 STRL XLNG CF (GLOVE) ×2 IMPLANT
GLOVE ORTHO TXT STRL SZ7.5 (GLOVE) ×4 IMPLANT
GOWN STRL REUS W/TWL LRG LVL3 (GOWN DISPOSABLE) ×2 IMPLANT
GOWN STRL REUS W/TWL XL LVL3 (GOWN DISPOSABLE) ×2 IMPLANT
LEGGING LITHOTOMY PAIR STRL (DRAPES) ×2 IMPLANT
LIQUID BAND (GAUZE/BANDAGES/DRESSINGS) ×2 IMPLANT
MANIFOLD NEPTUNE II (INSTRUMENTS) ×2 IMPLANT
PACK TOTAL KNEE CUSTOM (KITS) ×2 IMPLANT
SUT MNCRL AB 4-0 PS2 18 (SUTURE) ×2 IMPLANT
SUT VIC AB 1 CT1 36 (SUTURE) ×2 IMPLANT
SUT VIC AB 2-0 CT1 27 (SUTURE) ×2
SUT VIC AB 2-0 CT1 TAPERPNT 27 (SUTURE) ×2 IMPLANT
SUT VLOC 180 0 24IN GS25 (SUTURE) ×2 IMPLANT
SYR 50ML LL SCALE MARK (SYRINGE) ×2 IMPLANT
TRAY FOLEY W/METER SILVER 16FR (SET/KITS/TRAYS/PACK) IMPLANT
WRAP KNEE MAXI GEL POST OP (GAUZE/BANDAGES/DRESSINGS) ×2 IMPLANT

## 2015-01-08 NOTE — Transfer of Care (Signed)
Immediate Anesthesia Transfer of Care Note  Patient: William Burke  Procedure(s) Performed: Procedure(s): LEFT  UNI KNEE ARTHOLPLASTY MEDIALLY (Left)  Patient Location: PACU  Anesthesia Type:Spinal  Level of Consciousness:  sedated, patient cooperative and responds to stimulation  Airway & Oxygen Therapy:Patient Spontanous Breathing and Patient connected to face mask oxgen  Post-op Assessment:  Report given to PACU RN and Post -op Vital signs reviewed and stable  Post vital signs:  Reviewed and stable  Last Vitals:  Filed Vitals:   01/08/15 0531  BP: 177/67  Pulse: 76  Temp: 36.6 C  Resp: 18    Complications: No apparent anesthesia complications

## 2015-01-08 NOTE — Progress Notes (Signed)
[place], of [address],t notified PT will be in 1602 in 20 minutes.

## 2015-01-08 NOTE — Evaluation (Addendum)
Physical Therapy Evaluation Patient Details Name: William Burke MRN: [id number] DOB: Apr 30, 1940 Today's Date: 01/08/2015   History of Present Illness  74 y.o. male admitted for L UKA, h/o carotid endarterectomy.  Clinical Impression  Pt admitted with above diagnosis. Pt currently with functional limitations due to the deficits listed below (see PT Problem List). Pt ambulated 62' with RW with min A for LOB x 1, instructed pt in UKA exercises. Good progress expected.  Pt will benefit from skilled PT to increase their independence and safety with mobility to allow discharge to the venue listed below.       Follow Up Recommendations Home health PT    Equipment Recommendations  None recommended by PT    Recommendations for Other Services OT consult     Precautions / Restrictions Precautions Precautions: Fall Precaution Comments: pt denies h/o falls Restrictions Weight Bearing Restrictions: No Other Position/Activity Restrictions: WBAT      Mobility  Bed Mobility Overal bed mobility: Needs Assistance Bed Mobility: Supine to Sit           General bed mobility comments: HOB up, VCs technique, no physical assist  Transfers Overall transfer level: Needs assistance Equipment used: Rolling walker (2 wheeled) Transfers: Sit to/from Stand Sit to Stand: Supervision         General transfer comment: verbal cues hand placement, no physical assist  Ambulation/Gait Ambulation/Gait assistance: Min guard;Min assist Ambulation Distance (Feet): 90 Feet Assistive device: Rolling walker (2 wheeled) Gait Pattern/deviations: Step-through pattern   Gait velocity interpretation: at or above normal speed for age/gender General Gait Details: steady with RW, no LOB, good sequencing; min A for LOB x 1 (pt stated he felt "loopy from the dope")  Stairs            Wheelchair Mobility    Modified Rankin (Stroke Patients Only)       Balance Overall balance assessment: Needs  assistance   Sitting balance-Leahy Scale: Good       Standing balance-Leahy Scale: Fair                               Pertinent Vitals/Pain Pain Assessment: 0-10 Pain Score: 2  Pain Location: L knee with walking Pain Descriptors / Indicators: Sore Pain Intervention(s): Premedicated before session;Monitored during session;Limited activity within patient's tolerance;Ice applied    Home Living Family/patient expects to be discharged to:: Private residence Living Arrangements: Spouse/significant other Available Help at Discharge: Available 24 hours/day   Home Access: Stairs to enter   [employer] of Steps: 2 Home Layout: One level Home Equipment: Shower seat - built in;Walker - 2 wheels;Cane - single point      Prior Function Level of Independence: Independent with assistive device(s)         Comments: used SPC prior to surgery     Hand Dominance        Extremity/Trunk Assessment   Upper Extremity Assessment: Overall WFL for tasks assessed           Lower Extremity Assessment: LLE deficits/detail   LLE Deficits / Details: 0-60* AAROM L knee  Cervical / Trunk Assessment: Normal  Communication   Communication: HOH  Cognition Arousal/Alertness: Awake/alert Behavior During Therapy: WFL for tasks assessed/performed Overall Cognitive Status: Within Functional Limits for tasks assessed                      General Comments  Exercises Total Joint Exercises Ankle Circles/Pumps: AROM;Both;10 reps;Supine Quad Sets: AROM;Both;5 reps;Supine Towel Squeeze: AROM;Both;5 reps;Supine Short Arc Quad: AROM;Left;10 reps;Supine Heel Slides: AAROM;Left;10 reps;Supine Straight Leg Raises: AROM;AAROM;Left;10 reps;Supine Goniometric ROM: 0-60* AAROM L knee      Assessment/Plan    PT Assessment Patient needs continued PT services  PT Diagnosis Difficulty walking;Acute pain   PT Problem List Decreased strength;Decreased range of  motion;Decreased activity tolerance;Pain;Decreased mobility  PT Treatment Interventions DME instruction;Gait training;Stair training;Therapeutic activities;Patient/family education;Therapeutic exercise   PT Goals (Current goals can be found in the Care Plan section) Acute Rehab PT Goals Patient Stated Goal: to get back to walking 3 miles 3x/week PT Goal Formulation: With patient Time For Goal Achievement: 01/11/15 Potential to Achieve Goals: Good    Frequency 7X/week   Barriers to discharge        Co-evaluation               End of Session Equipment Utilized During Treatment: Gait belt Activity Tolerance: Patient tolerated treatment well;No increased pain Patient left: in chair;with call bell/phone within reach Nurse Communication: Mobility status         Time: 6578-4696 PT Time Calculation (min) (ACUTE ONLY): 44 min   Charges:   PT Evaluation $Initial PT Evaluation Tier I: 1 Procedure PT Treatments $Gait Training: 8-22 mins $Therapeutic Exercise: 8-22 mins   PT G Codes:  PT G-Codes **NOT FOR INPATIENT CLASS** Functional Assessment Tool Used clinical judgement clinical judgement at 1340 on 01/08/15 by Alvester Morin, PT Functional Limitation Mobility: Walking and moving around Mobility: Walking and moving around at 1340 on 01/08/15 by Alvester Morin, PT Mobility: Walking and Moving Around Current Status [phone]) At least 1 percent but less than 20 percent impaired, limited or restricted CI at 1340 on 01/08/15 by Alvester Morin, PT Mobility: Walking and Moving Around Goal Status [phone]) 0 percent impaired, limited or restricted CH at 1340 on 01/08/15 by Alvester Morin, PT Mobility: Walking and Moving Around Discharge Status [phone])       Tamala Ser 01/08/2015, 1:40 PM [phone]

## 2015-01-08 NOTE — Interval H&P Note (Signed)
History and Physical Interval Note:  01/08/2015 7:15 AM  William Burke  has presented today for surgery, with the diagnosis of LEFT MEDIAL COMPARTMENTAL OA  The various methods of treatment have been discussed with the patient and family. After consideration of risks, benefits and other options for treatment, the patient has consented to  Procedure(s): LEFT  UNI KNEE ARTHOLPLASTY MEDIALLY (Left) as a surgical intervention .  The patient's history has been reviewed, patient examined, no change in status, stable for surgery.  I have reviewed the patient's chart and labs.  Questions were answered to the patient's satisfaction.     Mauri Pole

## 2015-01-08 NOTE — Anesthesia Procedure Notes (Signed)
Spinal Patient location during procedure: OR Start time: 01/08/2015 7:25 AM End time: 01/08/2015 7:30 AM Staffing Anesthesiologist: Suella Broad D Resident/CRNA: Lajuana Carry E Performed by: resident/CRNA  Preanesthetic Checklist Completed: patient identified, site marked, surgical consent, pre-op evaluation, timeout performed, IV checked, risks and benefits discussed and monitors and equipment checked Spinal Block Patient position: sitting Prep: Betadine Patient monitoring: heart rate, continuous pulse ox and blood pressure Location: L3-4 Injection technique: single-shot Needle Needle type: Sprotte  Needle gauge: 24 G Needle length: 10 cm Assessment Sensory level: T6 Additional Notes Expiration date of kit checked and confirmed. Patient tolerated procedure well, without complications.

## 2015-01-08 NOTE — Op Note (Signed)
NAME: William Burke    MEDICAL RECORD NO.: [id number]   FACILITY: [facility]   DATE OF BIRTH: 05-09-1940  PHYSICIAN: Madlyn Frankel. Charlann Boxer, M.D.    DATE OF PROCEDURE: 01/08/2015    OPERATIVE REPORT   PREOPERATIVE DIAGNOSIS: Left knee medial compartment osteoarthritis.   POSTOPERATIVE DIAGNOSIS: Left knee medial compartment osteoarthritis.  PROCEDURE: Left partial knee replacement utilizing Biomet Oxford knee  component, size small femur, a left medial size B tibial tray with a size 4 mm insert.   SURGEON: Madlyn Frankel. Charlann Boxer, M.D.   ASSISTANT: Lanney Gins, PAC.  Please note that Mr. William Burke was present for the entirety of the case,  utilized for preoperative positioning, perioperative retractor  management, general facilitation of the case and primary wound closure.   ANESTHESIA: Spinal.   SPECIMENS: None.   COMPLICATIONS: None.  DRAINS: None   TOURNIQUET TIME: 29 minutes at 250 mmHg.   INDICATIONS FOR PROCEDURE: The patient is a 74 y.o. patient of mine who presented for evaluation of left knee pain.  They presented with primary complaints of pain on the medial side of their knee. Radiographs revealed advanced medial compartment arthritis with specifically an antero-medial wear pattern.  There was bone on bone changes noted with subchondral sclerosis and osteophytes present. The patient has had progressive problems failing to respond to conservative measures of medications, injections and activity modification. Risks of infection, DVT, component failure, need for future revision surgery were all discussed and reviewed.  Consent was obtained for benefit of pain relief.   PROCEDURE IN DETAIL: The patient was brought to the operative theater.  Once adequate anesthesia, preoperative antibiotics, 2 gm Ancef, 1 gm of Tranexamic Acid, and 10 mg of Decadron administered, the patient was positioned in supine position with a left thigh tourniquet  placed. The left lower extremity was prepped and  draped in sterile  fashion with the leg on the Oxford leg holder.  The leg was allowed to flex to 120 degrees. A time-out  was performed identifying the patient, planned procedure, and extremity.  The leg was exsanguinated, tourniquet elevated to 250 mmHg. A midline  incision was made from the proximal pole of the patella to the tibial tubercle. A  soft tissue plane was created and partial median arthrotomy was then  made to allow for subluxation of the patella. Following initial synovectomy and  debridement, the osteophytes were removed off the medial aspect of the  knee.  I sized the femur to be a size small using the spoons.  Attention was first directed to the tibia. With the small spoon in place I placed the tibial  extramedullary guide, positioned over the anterior crest of the tibia  and pinned it into position, and using a measured resection guide from the  Oxford system, a 4 mm resection was made off the proximal tibia. First  the reciprocating saw along the medial aspect of the tibial spines, then the oscillating saw.    At this point, I sized this cut surface seem to be best fit for a size B tibial tray.  With the retractors out of the wound and the knee held at 90 degrees the size 4 feeler gauge had appropriate tension on the medial ligament.   At this point, the femoral canal was opened with a drill and the  intramedullary rod passed. Then using the guide for a small posterior resection off  the posterior aspect of the femur was positioned over the mid portion of the medial femoral condyle.  The orientation was set using the guide that mates the femoral guide to the intramedullary rod.  The 2 drill holes were made into the distal femur.  The posterior guide was then impacted into place and the posterior  femoral cut made.  At this point, I milled the distal femur with a size 4 spigot in place. At this point, we did a trial reduction of the small femur, size B tibial tray and a  size 4 feeler gauge. At 90 degrees of flexion and at 20 degrees of flexion the knee had symmetric tension on  the ligaments.   Given these findings, the trial femoral component was removed. Final preparation of tibia was carried out by pinning it in position. Then  using a reciprocating saw I removed bone for the keel. Further bone was  removed with an osteotome.  Trial reduction was now carried out with the small femur, the left medial keeled tibia, and the size 4 lollipop insert. The balance of the  ligaments appeared to be symmetric at 20 degrees and 90 degrees. Given  all these findings, the trial components were removed.   Cement was mixed. The final components were opened. The knee was irrigated with  normal saline solution. Then final debridements of the  soft tissue was carried out, I also drilled the sclerotic bone with a drill.  The final components were cemented with a single batch of cement in a  two-stage technique with the tibial component cemented first. The knee  was then brought  to 45 degrees of flexion with a 4 feeler gauge, held with pressure for a minute and half.  After this the femoral component was cemented in place.  The knee was again held at 45 degrees of flexion while the cement fully cured.  Excess cement was removed throughout the knee. Tourniquet was let down  after 29 minutes. After the cement had fully cured and excessive cement  was removed throughout the knee there was no visualized cement present.   The final size 4 left medial insert to match the small femur was chosen and snapped into position. We re-irrigated  the knee. The extensor mechanism  was then reapproximated using a #1 Vicryl and # 0 V-lock sutures with the knee in flexion. The  remaining wound was closed with 2-0 Vicryl and a running 4-0 Monocryl.  The knee was cleaned, dried, and dressed sterilely using Dermabond and  Aquacel dressing. The patient  was brought to the recovery room, Ace  wrap in place, tolerating the  procedure well. He will be in the hospital for overnight observation.  We will initiate physical therapy and progress to ambulate.     Madlyn Frankel Charlann Boxer, M.D.

## 2015-01-08 NOTE — Anesthesia Postprocedure Evaluation (Signed)
Anesthesia Post Note  Patient: [name]  Procedure(s) Performed: Procedure(s) (LRB): LEFT  UNI KNEE ARTHOLPLASTY MEDIALLY (Left)  Patient location during evaluation: PACU Anesthesia Type: Spinal Level of consciousness: oriented and awake and alert Pain management: pain level controlled Vital Signs Assessment: post-procedure vital signs reviewed and stable Respiratory status: spontaneous breathing, respiratory function stable and patient connected to nasal cannula oxygen Cardiovascular status: blood pressure returned to baseline and stable Postop Assessment: no headache, no backache and spinal receding Anesthetic complications: no    Last Vitals:  Filed Vitals:   01/08/15 1005 01/08/15 1100  BP: 157/67 169/78  Pulse: 79 82  Temp: 36.8 C 36.8 C  Resp: 14 14    Last Pain:  Filed Vitals:   01/08/15 1123  PainSc: 0-No pain                 Effie Berkshire

## 2015-01-09 DIAGNOSIS — M1712 Unilateral primary osteoarthritis, left knee: Secondary | ICD-10-CM | POA: Diagnosis not present

## 2015-01-09 LAB — CBC
HCT: 35.8 % — ABNORMAL LOW (ref 39.0–52.0)
Hemoglobin: 11.7 g/dL — ABNORMAL LOW (ref 13.0–17.0)
MCH: 28.3 pg (ref 26.0–34.0)
MCHC: 32.7 g/dL (ref 30.0–36.0)
MCV: 86.7 fL (ref 78.0–100.0)
PLATELETS: 157 10*3/uL (ref 150–400)
RBC: 4.13 MIL/uL — ABNORMAL LOW (ref 4.22–5.81)
RDW: 14.6 % (ref 11.5–15.5)
WBC: 11.6 10*3/uL — AB (ref 4.0–10.5)

## 2015-01-09 LAB — BASIC METABOLIC PANEL
ANION GAP: 8 (ref 5–15)
BUN: 18 mg/dL (ref 6–20)
CALCIUM: 8.5 mg/dL — AB (ref 8.9–10.3)
CO2: 23 mmol/L (ref 22–32)
Chloride: 103 mmol/L (ref 101–111)
Creatinine, Ser: 0.67 mg/dL (ref 0.61–1.24)
GFR calc Af Amer: >60 mL/min (ref >60)
GLUCOSE: 226 mg/dL — AB (ref 65–99)
Potassium: 4.3 mmol/L (ref 3.5–5.1)
SODIUM: 134 mmol/L — AB (ref 135–145)

## 2015-01-09 LAB — GLUCOSE, CAPILLARY: Glucose-Capillary: 222 mg/dL — ABNORMAL HIGH (ref 65–99)

## 2015-01-09 MED ORDER — TRAMADOL HCL 50 MG PO TABS: 50.0000 mg | ORAL_TABLET | Freq: Four times a day (QID) | ORAL | Status: DC | PRN

## 2015-01-09 MED ORDER — TIZANIDINE HCL 4 MG PO TABS: 4.0000 mg | ORAL_TABLET | Freq: Four times a day (QID) | ORAL | Status: DC | PRN

## 2015-01-09 MED ORDER — POLYETHYLENE GLYCOL 3350 17 G PO PACK: 17.0000 g | PACK | Freq: Two times a day (BID) | ORAL | Status: DC

## 2015-01-09 MED ORDER — DOCUSATE SODIUM 100 MG PO CAPS: 100.0000 mg | ORAL_CAPSULE | Freq: Two times a day (BID) | ORAL | Status: DC

## 2015-01-09 NOTE — Progress Notes (Signed)
Physical Therapy Treatment Patient Details Name: William Burke MRN: [id number] DOB: 19-Jan-1941 Today's Date: 01/09/2015    History of Present Illness 74 y.o. male admitted for L UKA, h/o carotid endarterectomy.    PT Comments    Stair training completed, reviewed HEP. Pt is independent with mobility with RW. REady to DC home from PT standpoint.   Follow Up Recommendations  Home health PT     Equipment Recommendations  None recommended by PT    Recommendations for Other Services OT consult     Precautions / Restrictions Precautions Precautions: Fall Precaution Comments: pt denies h/o falls Restrictions Weight Bearing Restrictions: No LLE Weight Bearing: Weight bearing as tolerated Other Position/Activity Restrictions: WBAT    Mobility  Bed Mobility Overal bed mobility: Modified Independent Bed Mobility: Supine to Sit     Supine to sit: Modified independent (Device/Increase time)     General bed mobility comments: HOB up 35*  Transfers Overall transfer level: Needs assistance Equipment used: Rolling walker (2 wheeled) Transfers: Sit to/from Stand Sit to Stand: Supervision         General transfer comment: verbal cues hand placement, no physical assist  Ambulation/Gait Ambulation/Gait assistance: Modified independent (Device/Increase time) Ambulation Distance (Feet): 160 Feet Assistive device: Rolling walker (2 wheeled) Gait Pattern/deviations: Step-to pattern     General Gait Details: steady with RW, no LOB   Stairs Stairs: Yes Stairs assistance: Supervision Stair Management: No rails;With walker;Step to pattern;Backwards Number of Stairs: 2 General stair comments: verbal cues for sequencing and placement of RW  Wheelchair Mobility    Modified Rankin (Stroke Patients Only)       Balance Overall balance assessment: Needs assistance Sitting-balance support: No upper extremity supported;Feet supported Sitting balance-Leahy Scale: Good     Standing balance support: Bilateral upper extremity supported;During functional activity Standing balance-Leahy Scale: Good                      Cognition Arousal/Alertness: Awake/alert Behavior During Therapy: WFL for tasks assessed/performed Overall Cognitive Status: Within Functional Limits for tasks assessed                      Exercises Total Joint Exercises Ankle Circles/Pumps: AROM;Both;10 reps;Supine Quad Sets: AROM;Both;5 reps;Supine Short Arc Quad: AROM;Left;10 reps;Supine Heel Slides: AAROM;Left;10 reps;Supine Straight Leg Raises: AROM;Left;10 reps;Supine Long Arc Quad: AROM;Left;10 reps;Seated Knee Flexion: AROM;Left;10 reps;Seated;AAROM Goniometric ROM: 0-80* AAROM L knee    General Comments        Pertinent Vitals/Pain Pain Assessment: Faces Pain Score: 8  Faces Pain Scale: Hurts a little bit Pain Location: L knee with flexion exercises, pt declined pain medication Pain Descriptors / Indicators: Sore Pain Intervention(s): Monitored during session;Limited activity within patient's tolerance;Ice applied    Home Living Family/patient expects to be discharged to:: Private residence Living Arrangements: Spouse/significant other Available Help at Discharge: Available 24 hours/day;Family (wife and daughter) Type of Home: House Home Access: Stairs to enter   Home Layout: One level Home Equipment: Shower seat - built in;Walker - 2 wheels;Cane - single point      Prior Function Level of Independence: Independent with assistive device(s)      Comments: used SPC prior to surgery   PT Goals (current goals can now be found in the care plan section) Acute Rehab PT Goals Patient Stated Goal: walking in neighborhood, be with his 2 dogs PT Goal Formulation: With patient Time For Goal Achievement: 01/11/15 Potential to Achieve Goals: Good Progress towards PT goals:  Progressing toward goals    Frequency  7X/week    PT Plan Current plan remains  appropriate    Co-evaluation             End of Session Equipment Utilized During Treatment: Gait belt Activity Tolerance: Patient tolerated treatment well Patient left: in chair;with call bell/phone within reach;with nursing/sitter in room;with family/visitor present     Time: 1026-1047 PT Time Calculation (min) (ACUTE ONLY): 21 min  Charges:  $Gait Training: 8-22 mins                    G Codes:  Functional Assessment Tool Used: clinical judgement Mobility: Walking and Moving Around Discharge Status [phone]): 0 percent impaired, limited or restricted   Tamala Ser 01/09/2015, 10:53 AM [phone]

## 2015-01-09 NOTE — Evaluation (Signed)
Occupational Therapy Evaluation Patient Details Name: William Burke MRN: [id number] DOB: Aug 22, 1940 Today's Date: 01/09/2015    History of Present Illness 74 y.o. male admitted for L UKA, h/o carotid endarterectomy.   Clinical Impression   Patient admitted with above. Patient independent PTA. Patient currently functioning at an overall supervision to min assist level. Patient's wife will be available to assist him prn. Encouraged patient to work on reaching LLE to increase independence with LB ADLs. Plan is for patient to discharge later today. No additional OT needs identified, D/C from acute OT services and no additional follow-up OT needs at this time. All appropriate education provided to patient. Please re-order OT if needed.      Follow Up Recommendations  No OT follow up;Supervision - Intermittent    Equipment Recommendations  None recommended by OT    Recommendations for Other Services  None at this time    Precautions / Restrictions Precautions Precautions: Fall Restrictions Weight Bearing Restrictions: Yes LLE Weight Bearing: Weight bearing as tolerated    Mobility Bed Mobility Overal bed mobility: Needs Assistance Bed Mobility: Supine to Sit     Supine to sit: Supervision     General bed mobility comments: supervision for safety  Transfers Overall transfer level: Needs assistance Equipment used: Rolling walker (2 wheeled) Transfers: Sit to/from Stand Sit to Stand: Supervision General transfer comment: verbal cues hand placement and safety, no physical assist    Balance Overall balance assessment: Needs assistance Sitting-balance support: No upper extremity supported;Feet supported Sitting balance-Leahy Scale: Good     Standing balance support: Bilateral upper extremity supported;During functional activity Standing balance-Leahy Scale: Good    ADL Overall ADL's : Needs assistance/impaired Eating/Feeding: Set up;Sitting   Grooming: Set  up;Sitting   Upper Body Bathing: Set up;Sitting   Lower Body Bathing: Minimal assistance;Sit to/from stand   Upper Body Dressing : Set up;Sitting   Lower Body Dressing: Minimal assistance;Sit to/from stand   Toilet Transfer: Supervision/safety;RW;Regular Toilet;Grab bars       Tub/ Engineer, structural: Supervision/safety;Shower seat;Ambulation;Rolling walker   Functional mobility during ADLs: Supervision/safety;Rolling walker;Cueing for safety General ADL Comments: Pt required cueing for safety using RW.     Vision  Vision Assessment?: No apparent visual deficits          Pertinent Vitals/Pain Pain Assessment: Faces Faces Pain Scale: Hurts a little bit Pain Location: L knee during ambulation Pain Descriptors / Indicators: Sore;Discomfort;Guarding Pain Intervention(s): Monitored during session;Repositioned;Ice applied     Hand Dominance Right   Extremity/Trunk Assessment Upper Extremity Assessment Upper Extremity Assessment: Overall WFL for tasks assessed   Lower Extremity Assessment Lower Extremity Assessment: Defer to PT evaluation   Cervical / Trunk Assessment Cervical / Trunk Assessment: Normal   Communication Communication Communication: HOH   Cognition Arousal/Alertness: Awake/alert Behavior During Therapy: WFL for tasks assessed/performed Overall Cognitive Status: Within Functional Limits for tasks assessed              Home Living Family/patient expects to be discharged to:: Private residence Living Arrangements: Spouse/significant other Available Help at Discharge: Available 24 hours/day;Family (wife and daughter) Type of Home: House Home Access: Stairs to enter Secretary/administrator of Steps: 2   Home Layout: One level     Bathroom Shower/Tub: Walk-in shower;Door   [employer] Toilet: Standard     Home Equipment: Shower seat - built in;Walker - 2 wheels;Cane - single point   Prior Functioning/Environment Level of Independence: Independent  with assistive device(s)  Comments: used SPC prior to surgery  OT Diagnosis: Generalized weakness;Acute pain   OT Problem List:  n/a, no acute OT needs identified    OT Treatment/Interventions:   n/a, no acute OT needs identified    OT Goals(Current goals can be found in the care plan section) Acute Rehab OT Goals Patient Stated Goal: go home now OT Goal Formulation: All assessment and education complete, DC therapy  OT Frequency:  n/a, no acute OT needs identified    Barriers to D/C:  None known at this time    End of Session Equipment Utilized During Treatment: Rolling walker  Activity Tolerance: Patient tolerated treatment well Patient left: in chair;with call bell/phone within reach   Time: 0946-1006 OT Time Calculation (min): 20 min Charges:  OT General Charges $OT Visit: 1 Procedure OT Evaluation $Initial OT Evaluation Tier I: 1 Procedure G-Codes: OT G-codes **NOT FOR INPATIENT CLASS** Functional Limitation: Self care Self Care Current Status (W0981): At least 1 percent but less than 20 percent impaired, limited or restricted Self Care Goal Status (X9147): At least 1 percent but less than 20 percent impaired, limited or restricted Self Care Discharge Status [phone]): At least 1 percent but less than 20 percent impaired, limited or restricted  James Lafalce , MS, OTR/L, CLT Pager: [phone]  01/09/2015, 10:13 AM

## 2015-01-09 NOTE — Discharge Instructions (Signed)

## 2015-01-09 NOTE — Care Management Note (Signed)
Case Management Note  Patient Details  Name: William Burke MRN: [id number] Date of Birth: Jan 25, 1941  Subjective/Objective:      S/p Left partial knee replacement               Action/Plan: Discharge planning, spoke with patient at bedside. Have chosen [facility] for [facility]. Contacted [facility] for referral. Has RW at home declines other DME.  Expected Discharge Date:                  Expected Discharge Plan:  [facility]  In-House Referral:  NA  Discharge planning Services  CM Consult  Post Acute Care Choice:  Home Health Choice offered to:  Patient  DME Arranged:  N/A DME Agency:  NA  HH Arranged:  PT HH Agency:  [facility]  Status of Service:  Completed, signed off  Medicare Important Message Given:    Date Medicare IM Given:    Medicare IM give by:    Date Additional Medicare IM Given:    Additional Medicare Important Message give by:     If discussed at [place] of Stay Meetings, dates discussed:    Additional Comments:  Guadalupe Maple, RN 01/09/2015, 9:45 AM

## 2015-01-09 NOTE — Progress Notes (Signed)
Patient ID: William Burke, male   DOB: Oct 30, 1940, 74 y.o.   MRN: [id number] Subjective: 1 Day Post-Op Procedure(s) (LRB): LEFT  UNI KNEE ARTHOLPLASTY MEDIALLY (Left)    Patient reports pain as mild.  Very pleased with process, no events, up with therapy yesterday  Objective:   VITALS:   Filed Vitals:   01/09/15 0106 01/09/15 0613  BP: 156/78 154/71  Pulse: 75 70  Temp: 98.4 F (36.9 C) 97.6 F (36.4 C)  Resp: 18 20    Neurovascular intact Incision: dressing C/D/I  LABS  Recent Labs  01/09/15 0520  HGB 11.7*  HCT 35.8*  WBC 11.6*  PLT 157     Recent Labs  01/09/15 0520  NA 134*  K 4.3  BUN 18  CREATININE 0.67  GLUCOSE 226*    No results for input(s): LABPT, INR in the last 72 hours.   Assessment/Plan: 1 Day Post-Op Procedure(s) (LRB): LEFT  UNI KNEE ARTHOLPLASTY MEDIALLY (Left)   Discharge home with home health  RTC in 2 weeks to check function

## 2015-01-18 NOTE — Discharge Summary (Signed)
Physician Discharge Summary  Patient ID: William Burke MRN: [id number] DOB/AGE: Jan 30, 1940 74 y.o.  Admit date: 01/08/2015 Discharge date: 01/09/2015   Procedures:  Procedure(s) (LRB): LEFT  UNI KNEE ARTHOLPLASTY MEDIALLY (Left)  Attending Physician:  Dr. Durene Romans   Admission Diagnoses:   Left knee medial compartmental primary OA /pain  Discharge Diagnoses:  Principal Problem:   S/P left UKR Active Problems:   Status post unilateral knee replacement  Past Medical History  Diagnosis Date  . PAD (peripheral artery disease) (HCC) 06/22/2007    Bilateral iliac artery PTA and stenting with iCAST. Last lower extremity arterial Dopplers 02/25/2011 right ABI 1.0 left ABI 1.1.  Bilateral carotid artery stenosis. Last carotid Dopplers 07/18/2011: Max ICA stenosis 50-69% bilaterally  . Diabetes mellitus, type 2 (HCC)   . Hypertension   . Hyperlipidemia   . History of tobacco abuse   . Carotid artery disease (HCC)     status post right carotid artery stenting 05/24/13  . UTI (lower urinary tract infection)   . GERD (gastroesophageal reflux disease)     occ  . Complication of anesthesia     sensitive to anesthesia ,longer to awaken-"doesn't require much"  . Arthritis     osteoarthritis knees, elbow right    HPI:    William Burke, 74 y.o. male male, has a history of pain and functional disability in the left and has failed non-surgical conservative treatments for greater than 12 weeks to include NSAID's and/or analgesics, corticosteriod injections, viscosupplementation injections, use of assistive devices and activity modification. Onset of symptoms was gradual, starting 2+ years ago with gradually worsening course since that time. The patient noted no past surgery on the left knee(s). Patient currently rates pain in the left knee(s) at 8 out of 10 with activity. Patient has night pain, worsening of pain with activity and weight bearing, pain that interferes with activities of  daily living, pain with passive range of motion, crepitus and joint swelling. Patient has evidence of periarticular osteophytes and joint space narrowing of the medial compartment by imaging studies. There is no active infection. Risks, benefits and expectations were discussed with the patient. Risks including but not limited to the risk of anesthesia, blood clots, nerve damage, blood vessel damage, failure of the prosthesis, infection and up to and including death. Patient understand the risks, benefits and expectations and wishes to proceed with surgery.    PCP: Delorse Lek, MD   Discharged Condition: good  Hospital Course:  Patient underwent the above stated procedure on 01/08/2015. Patient tolerated the procedure well and brought to the recovery room in good condition and subsequently to the floor.  POD #1 BP: 154/71 ; Pulse: 70 ; Temp: 97.6 F (36.4 C) ; Resp: 20 Patient reports pain as mild. Very pleased with process, no events, up with therapy yesterday Neurovascular intact and incision: dressing C/D/I  LABS  Basename    HGB     11.7  HCT     35.8    Discharge Exam: General appearance: alert, cooperative and no distress Extremities: Homans sign is negative, no sign of DVT, no edema, redness or tenderness in the calves or thighs and no ulcers, gangrene or trophic changes  Disposition: Home with follow up in 2 weeks   Follow-up Information    Follow up with [facility].   Why:  physical therapy   Contact information:   [address] [place] [address] [phone]       Follow  up with Shelda Pal, MD. Schedule an appointment as soon as possible for a visit in 2 weeks.   Specialty:  Orthopedic Surgery   Contact information:   [address] [place] [address] [phone]       Discharge Instructions    Call MD / Call 911    Complete by:  As directed   If you experience chest pain or shortness of breath, CALL  911 and be transported to the hospital emergency room.  If you develope a fever above 101 F, pus (white drainage) or increased drainage or redness at the wound, or calf pain, call your surgeon's office.     Change dressing    Complete by:  As directed   Maintain surgical dressing until follow up in the clinic. If the edges start to pull up, may reinforce with tape. If the dressing is no longer working, may remove and cover with gauze and tape, but must keep the area dry and clean.  Call with any questions or concerns.     Constipation Prevention    Complete by:  As directed   Drink plenty of fluids.  Prune juice may be helpful.  You may use a stool softener, such as Colace (over the counter) 100 mg twice a day.  Use MiraLax (over the counter) for constipation as needed.     Diet - low sodium heart healthy    Complete by:  As directed      Discharge instructions    Complete by:  As directed   Maintain surgical dressing until follow up in the clinic. If the edges start to pull up, may reinforce with tape. If the dressing is no longer working, may remove and cover with gauze and tape, but must keep the area dry and clean.  Follow up in 2 weeks at [facility]. Call with any questions or concerns.     Increase activity slowly as tolerated    Complete by:  As directed   Weight bearing as tolerated with assist device (walker, cane, etc) as directed, use it as long as suggested by your surgeon or therapist, typically at least 4-6 weeks.     TED hose    Complete by:  As directed   Use stockings (TED hose) for 2 weeks on both leg(s).  You may remove them at night for sleeping.             Medication List    TAKE these medications        aspirin EC 81 MG tablet  Take 81 mg by mouth daily.     atorvastatin 80 MG tablet  Commonly known as:  LIPITOR  Take 1 tablet (80 mg total) by mouth daily.     cholecalciferol 1000 UNITS tablet  Commonly known as:  VITAMIN D  Take 1,000 Units by  mouth daily.     Chromium Picolinate 500 MCG Caps  Take 500 mg by mouth daily.     CIALIS 5 MG tablet  Generic drug:  tadalafil  TAKE 1 TABLET every other day at night to hep reduce urination frequency at night     clopidogrel 75 MG tablet  Commonly known as:  PLAVIX  Take 1 tablet (75 mg total) by mouth daily.     diltiazem 240 MG 24 hr capsule  Commonly known as:  TIAZAC  Take 240 mg by mouth daily.     docusate sodium 100 MG capsule  Commonly known as:  COLACE  Take 1 capsule (100 mg total) by mouth 2 (two) times daily.     ferrous sulfate 325 (65 FE) MG tablet  Take 325 mg by mouth 2 (two) times daily with a meal.     fish oil-omega-3 fatty acids 1000 MG capsule  Take 1 g by mouth daily.     Flaxseed Oil 1000 MG Caps  Take 1,000 mg by mouth daily.     fluticasone 50 MCG/ACT nasal spray  Commonly known as:  FLONASE  Place 1 spray into both nostrils at bedtime.     Garlic Oil 1000 MG Caps  Take 1,000 mg by mouth daily.     glipiZIDE 10 MG tablet  Commonly known as:  GLUCOTROL  Take 1 tablet by mouth daily before breakfast.     lisinopril-hydrochlorothiazide 20-12.5 MG tablet  Commonly known as:  PRINZIDE,ZESTORETIC  Take 1 tablet by mouth daily.     Magnesium Oxide 500 MG Tabs  Take 500 mg by mouth daily.     metFORMIN 1000 MG tablet  Commonly known as:  GLUCOPHAGE  Take 1,000 mg by mouth 2 (two) times daily with a meal.     niacin 500 MG tablet  Take 500 mg by mouth daily with breakfast.     omeprazole 20 MG capsule  Commonly known as:  PRILOSEC  Take 20 mg by mouth 2 (two) times daily.     OSTEO BI-FLEX ADV JOINT SHIELD PO  Take 1 tablet by mouth daily.     polyethylene glycol packet  Commonly known as:  MIRALAX / GLYCOLAX  Take 17 g by mouth 2 (two) times daily.     POTASSIUM GLUCONATE PO  Take 99 mg by mouth daily.     saw palmetto 160 MG capsule  Take 160 mg by mouth daily.     SUPER B COMPLEX PO  Take 1 tablet by mouth daily.      terazosin 10 MG capsule  Commonly known as:  HYTRIN  Take 10 mg by mouth at bedtime.     tiZANidine 4 MG tablet  Commonly known as:  ZANAFLEX  Take 1 tablet (4 mg total) by mouth every 6 (six) hours as needed for muscle spasms.     TRADJENTA 5 MG Tabs tablet  Generic drug:  linagliptin  Take 5 mg by mouth daily.     traMADol 50 MG tablet  Commonly known as:  ULTRAM  Take 1-2 tablets (50-100 mg total) by mouth every 6 (six) hours as needed for moderate pain or severe pain.     vitamin C 500 MG tablet  Commonly known as:  ASCORBIC ACID  Take 500 mg by mouth daily.     zinc gluconate 50 MG tablet  Take 50 mg by mouth daily.         Signed: Anastasio Auerbach. Burrel Legrand   PA-C  01/18/2015, 9:58 AM

## 2015-05-07 ENCOUNTER — Encounter: Payer: Self-pay | Admitting: Cardiology

## 2015-05-08 ENCOUNTER — Telehealth: Payer: Self-pay | Admitting: Cardiovascular Disease

## 2015-05-08 NOTE — Telephone Encounter (Signed)
New message   Pt states he is having pain in his legs and he wants   to know if the doctor thinks he should have a doppler done

## 2015-05-08 NOTE — Telephone Encounter (Signed)
Pt c/o joint pains/soreness in knees ankles and hips. Notes he had knee surgery in Dec  Pt is going to call primary care to investigate possible arthritis symptoms, didn't know if based on symptoms he needed sooner dopplers than June (due for yearly).  Advised PCP workup and follow up w/ [place] as scheduled. Will route for recommendation for doppler orders.

## 2015-06-06 ENCOUNTER — Telehealth: Payer: Self-pay | Admitting: Cardiovascular Disease

## 2015-06-06 DIAGNOSIS — I739 Peripheral vascular disease, unspecified: Secondary | ICD-10-CM

## 2015-06-06 DIAGNOSIS — I6529 Occlusion and stenosis of unspecified carotid artery: Secondary | ICD-10-CM

## 2015-06-06 NOTE — Telephone Encounter (Signed)
Spoke with patient Patient states his legs feel like they are going to give out on him - specifically his right left - has stents Patient's right left is white as snow, no hair on this leg Patient's left leg is tan, has hair Patient has noticed this starting about 3-4 weeks ago Patient endorses pain in right leg, weakness  Patient's last doppler was 2015 and he was supposed to have repeat LEAs 1 year later - 2016 per last test result. This was not ordered/scheduled.  Order for LEAs placed in EPIC Notified patient that per his last visit with Dr. Gwenlyn Found he was to have a carotid doppler study which does not appear to have been completed - no order in [facility] Order for carotid doppler placed in EPIC  Reference 09/2014 note: PAD (peripheral artery disease) - Lorretta Harp, MD at 10/03/2014 9:15 AM     Status: Written Related Problem: PAD (peripheral artery disease)   Expand All Collapse All   History of peripheral arterial disease status post bilateral iliac artery stenting using I cast stents 06/22/07. His most recent lower extremity arterial Doppler studies performed 06/30/14 revealed a right ABI of 0.67 with a high-frequency signal in the right common iliac artery and a left of 1.0. He does deny claudication. He is scheduled for repeat Dopplers in June 2017.       Carotid stenosis, asymptomatic - Lorretta Harp, MD at 10/03/2014 9:18 AM     Status: Written Related Problem: Carotid stenosis, asymptomatic   Expand All Collapse All   History of carotid artery disease status post right internal carotid artery stenting by myself and Dr. Trula Slade using distal protection 06/23/13. He had follow-up carotid Dopplers performed in June of last year revealing a widely patent stent. He is neurologically asymptomatic on aspirin and clopidogrel. We will recheck carotid Doppler studies        Informed patient I would send message to Dr. Gwenlyn Found as Juluis Rainier and also to our schedulers to arrange his doppler  studies

## 2015-06-06 NOTE — Telephone Encounter (Signed)
New message    Patient calling c/o pain & numbness in legs   No chest pain   No sob

## 2015-06-06 NOTE — Telephone Encounter (Signed)
F/u  Pt returning RN phone call. Please call back and discuss.   

## 2015-06-06 NOTE — Telephone Encounter (Signed)
No answer. Left message to call back.   

## 2015-06-07 ENCOUNTER — Other Ambulatory Visit: Payer: Self-pay | Admitting: Cardiovascular Disease

## 2015-06-07 DIAGNOSIS — I739 Peripheral vascular disease, unspecified: Secondary | ICD-10-CM

## 2015-06-08 NOTE — Telephone Encounter (Signed)
Needs lower extremity arterial Doppler studies and then return office visit to see me

## 2015-06-08 NOTE — Telephone Encounter (Signed)
Patient scheduled for LEAs 5/19 and carotid 5/23  Will need follow up with Dr. Gwenlyn Found

## 2015-06-11 ENCOUNTER — Other Ambulatory Visit: Payer: Self-pay | Admitting: Cardiovascular Disease

## 2015-06-11 DIAGNOSIS — I6529 Occlusion and stenosis of unspecified carotid artery: Secondary | ICD-10-CM

## 2015-06-12 NOTE — Telephone Encounter (Signed)
Called pt and schedule appt for 06/27/15.  Pt verbalized understanding.

## 2015-06-15 ENCOUNTER — Ambulatory Visit (HOSPITAL_COMMUNITY)
Admission: RE | Admit: 2015-06-15 | Discharge: 2015-06-15 | Disposition: A | Discharge status: Home / Self Care | Payer: Medicare Other | Source: Ambulatory Visit | Attending: Cardiovascular Disease | Admitting: Cardiovascular Disease

## 2015-06-15 ENCOUNTER — Other Ambulatory Visit: Payer: Self-pay | Admitting: Cardiovascular Disease

## 2015-06-15 DIAGNOSIS — E785 Hyperlipidemia, unspecified: Secondary | ICD-10-CM | POA: Insufficient documentation

## 2015-06-15 DIAGNOSIS — Z87891 Personal history of nicotine dependence: Secondary | ICD-10-CM | POA: Insufficient documentation

## 2015-06-15 DIAGNOSIS — E119 Type 2 diabetes mellitus without complications: Secondary | ICD-10-CM | POA: Diagnosis not present

## 2015-06-15 DIAGNOSIS — I70203 Unspecified atherosclerosis of native arteries of extremities, bilateral legs: Secondary | ICD-10-CM | POA: Insufficient documentation

## 2015-06-15 DIAGNOSIS — I6529 Occlusion and stenosis of unspecified carotid artery: Secondary | ICD-10-CM

## 2015-06-15 DIAGNOSIS — I10 Essential (primary) hypertension: Secondary | ICD-10-CM | POA: Diagnosis not present

## 2015-06-15 DIAGNOSIS — I739 Peripheral vascular disease, unspecified: Secondary | ICD-10-CM

## 2015-06-19 ENCOUNTER — Ambulatory Visit (HOSPITAL_COMMUNITY)
Admission: RE | Admit: 2015-06-19 | Discharge: 2015-06-19 | Disposition: A | Discharge status: Home / Self Care | Payer: Medicare Other | Source: Ambulatory Visit | Attending: Cardiology | Admitting: Cardiology

## 2015-06-19 DIAGNOSIS — K219 Gastro-esophageal reflux disease without esophagitis: Secondary | ICD-10-CM | POA: Diagnosis not present

## 2015-06-19 DIAGNOSIS — I10 Essential (primary) hypertension: Secondary | ICD-10-CM | POA: Insufficient documentation

## 2015-06-19 DIAGNOSIS — E119 Type 2 diabetes mellitus without complications: Secondary | ICD-10-CM | POA: Insufficient documentation

## 2015-06-19 DIAGNOSIS — I739 Peripheral vascular disease, unspecified: Secondary | ICD-10-CM | POA: Diagnosis not present

## 2015-06-19 DIAGNOSIS — Z87891 Personal history of nicotine dependence: Secondary | ICD-10-CM | POA: Insufficient documentation

## 2015-06-19 DIAGNOSIS — I6523 Occlusion and stenosis of bilateral carotid arteries: Secondary | ICD-10-CM | POA: Insufficient documentation

## 2015-06-19 DIAGNOSIS — I6529 Occlusion and stenosis of unspecified carotid artery: Secondary | ICD-10-CM | POA: Diagnosis not present

## 2015-06-19 DIAGNOSIS — E785 Hyperlipidemia, unspecified: Secondary | ICD-10-CM | POA: Insufficient documentation

## 2015-06-20 ENCOUNTER — Other Ambulatory Visit: Payer: Self-pay | Admitting: *Deleted

## 2015-06-20 DIAGNOSIS — I6523 Occlusion and stenosis of bilateral carotid arteries: Secondary | ICD-10-CM

## 2015-06-20 DIAGNOSIS — I779 Disorder of arteries and arterioles, unspecified: Secondary | ICD-10-CM

## 2015-06-20 DIAGNOSIS — I739 Peripheral vascular disease, unspecified: Secondary | ICD-10-CM

## 2015-06-27 ENCOUNTER — Encounter: Payer: Self-pay | Admitting: Cardiovascular Disease

## 2015-06-27 ENCOUNTER — Ambulatory Visit (INDEPENDENT_AMBULATORY_CARE_PROVIDER_SITE_OTHER): Payer: Medicare Other | Admitting: Cardiovascular Disease

## 2015-06-27 VITALS — BP 137/81 | HR 87 | Ht 65.0 in | Wt 193.0 lb

## 2015-06-27 DIAGNOSIS — E785 Hyperlipidemia, unspecified: Secondary | ICD-10-CM | POA: Diagnosis not present

## 2015-06-27 DIAGNOSIS — I779 Disorder of arteries and arterioles, unspecified: Secondary | ICD-10-CM

## 2015-06-27 DIAGNOSIS — I739 Peripheral vascular disease, unspecified: Secondary | ICD-10-CM

## 2015-06-27 DIAGNOSIS — I10 Essential (primary) hypertension: Secondary | ICD-10-CM

## 2015-06-27 NOTE — Progress Notes (Signed)
06/27/2015 William Burke   1940/04/04  [id number]  Primary Physician William Lek, MD Primary Cardiologist: William Gess MD William Burke   HPI:  The patient is a very pleasant 75 year old mildly to moderately overweight married Caucasian male father of 71, grandfather to 6 grandchildren who I saw 10/03/14. He has a history of bilateral iliac artery PTA and stenting by myself Jun 22, 2007 using iCAST covered stents for lifestyle-limiting claudication. This resulted in marked improvement in his symptoms on Dopplers. His other problems include non-insulin-requiring diabetes, hypertension and hyperlipidemia as well as remote tobacco abuse. He denies chest pain or shortness of breath. A Myoview performed in April of 2009 was nonischemic. An echo revealed normal LV systolic function with mild concentric LVH. He does have bilateral carotid disease which by duplex ultrasound performed this past June showed progression of disease on both sides. He is neurologically asymptomatic. Recent lower extremity Dopplers performed 06/24/12 showed his stents to be widely patent.carotid Dopplers performed on the same day showed significant progression of disease on the right side now with a hemodynamic significant stenosis. Most recent lab work performed earlier this month revealed a total cholesterol of 157, LDL of 89, and HDL of 38.  He had carotid Dopplers performed 08/06/12 that showed significant progression of disease on the right side now in the critical range. He does complain of some "tingling" in his right cheek. He is intolerant to Plavix because of epistaxis. Dr. Myra Burke and I performed right internal carotid artery stenting on William Burke 05/24/13 using distal embolic protection. The angiographic and clinical result excellent. Patient denies chest pain or shortness of breath but does complain of relatively new onset right lower extremity claudication. His ABI has fallen 2.65 on the right which is  significant compared to his prior Doppler 2015 when it was 0.95.He has had a left total knee replacement performed by Dr. Charlann Burke in December of last year.  Current Outpatient Prescriptions  Medication Sig Dispense Refill  . aspirin EC 81 MG tablet Take 81 mg by mouth daily.    [name] atorvastatin (LIPITOR) 40 MG tablet Take 40 mg by mouth daily.    . B Complex-C (SUPER B COMPLEX PO) Take 1 tablet by mouth daily.    . cholecalciferol (VITAMIN D) 1000 UNITS tablet Take 1,000 Units by mouth daily.    . Chromium Picolinate 500 MCG CAPS Take 500 mg by mouth daily.     [name] CIALIS 5 MG tablet TAKE 1 TABLET every other day at night to hep reduce urination frequency at night  11  . clopidogrel (PLAVIX) 75 MG tablet Take 1 tablet (75 mg total) by mouth daily. 30 tablet 3  . diltiazem (TIAZAC) 240 MG 24 hr capsule Take 240 mg by mouth daily.     [name] docusate sodium (COLACE) 100 MG capsule Take 1 capsule (100 mg total) by mouth 2 (two) times daily. 10 capsule 0  . ferrous sulfate 325 (65 FE) MG tablet Take 325 mg by mouth 2 (two) times daily with a meal.    . fish oil-omega-3 fatty acids 1000 MG capsule Take 1 g by mouth daily.    . Flaxseed, Linseed, (FLAXSEED OIL) 1000 MG CAPS Take 1,000 mg by mouth daily.     . fluticasone (FLONASE) 50 MCG/ACT nasal spray Place 1 spray into both nostrils at bedtime.     . Garlic Oil 1000 MG CAPS Take 1,000 mg by mouth daily.    [name] glipiZIDE (GLUCOTROL) 10 MG  tablet Take 1 tablet by mouth daily before breakfast.     . lisinopril-hydrochlorothiazide (PRINZIDE,ZESTORETIC) 20-12.5 MG tablet Take 1 tablet by mouth daily.  1  . Magnesium Oxide 500 MG TABS Take 500 mg by mouth daily.    . metFORMIN (GLUCOPHAGE) 1000 MG tablet Take 1,000 mg by mouth 2 (two) times daily with a meal.    . Misc Natural Products (OSTEO BI-FLEX ADV JOINT SHIELD PO) Take 1 tablet by mouth daily.    . niacin 500 MG tablet Take 500 mg by mouth daily with breakfast.    . omeprazole (PRILOSEC) 20 MG capsule Take  20 mg by mouth 2 (two) times daily.  1  . polyethylene glycol (MIRALAX / GLYCOLAX) packet Take 17 g by mouth 2 (two) times daily. 14 each 0  . POTASSIUM GLUCONATE PO Take 99 mg by mouth daily.    . saw palmetto 160 MG capsule Take 160 mg by mouth daily.    [name] terazosin (HYTRIN) 10 MG capsule Take 10 mg by mouth at bedtime.    [name] tiZANidine (ZANAFLEX) 4 MG tablet Take 1 tablet (4 mg total) by mouth every 6 (six) hours as needed for muscle spasms. 40 tablet 0  . traMADol (ULTRAM) 50 MG tablet Take 1-2 tablets (50-100 mg total) by mouth every 6 (six) hours as needed for moderate pain or severe pain. 80 tablet 0  . vitamin C (ASCORBIC ACID) 500 MG tablet Take 500 mg by mouth daily.    [name] zinc gluconate 50 MG tablet Take 50 mg by mouth daily.     No current facility-administered medications for this visit.    No Known Allergies  Social History   Social History  . Marital Status: Married    Spouse Name: N/A  . Number of Children: N/A  . Years of Education: N/A   Occupational History  . Not on file.   Social History Main Topics  . Smoking status: Former Smoker -- 3.00 packs/day for 25 years    Types: Cigarettes    Quit date: 08/06/1987  . Smokeless tobacco: Never Used  . Alcohol Use: No  . Drug Use: No  . Sexual Activity: Not on file   Other Topics Concern  . Not on file   Social History Narrative     Review of Systems: General: negative for chills, fever, night sweats or weight changes.  Cardiovascular: negative for chest pain, dyspnea on exertion, edema, orthopnea, palpitations, paroxysmal nocturnal dyspnea or shortness of breath Dermatological: negative for rash Respiratory: negative for cough or wheezing Urologic: negative for hematuria Abdominal: negative for nausea, vomiting, diarrhea, bright red blood per rectum, melena, or hematemesis Neurologic: negative for visual changes, syncope, or dizziness All other systems reviewed and are otherwise negative except as noted  above.    Blood pressure 137/81, pulse 87, height 5\' 5"  (1.651 m), weight 193 lb (87.544 kg).  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 I personally reviewed this EKG  ASSESSMENT AND PLAN:   PAD (peripheral artery disease) History of peripheral arterial disease status post bilateral iliac artery PTA and stenting using "ICast covered stents 06/22/07 for left  element claudication. His Dopplers performed 06/08/13 revealed ABIs) 1 bilaterally with mildly elevated iliac velocities however his most recent Dopplers performed 06/15/15 revealed a decline in hi right ABI to 0.65. He also does complain of right lower extremity claudication over the last 6-8 months. Based on this will get formal lower extremity until Doppler studies in anticipation of performing angiography and potential right lower extremity intervention.  Hypertension History of hypertension with blood pressure measurements at 137/81. He is on diltiazem, lisinopril and hydrochlorothiazide. Continue current meds at current dosing  Hyperlipidemia History of hyperlipidemia on statin therapy followed by his PCP  Carotid artery disease history of carotid artery disease status post right carotid endarterectomy performed by Dr. Myra Burke on 09/02/12. Because of aggressive restenosis demonstrated by duplex ultrasound he ultimately underwent right internal carotid artery stenting by myself and Dr. Myra Burke on 05/24/13 using distal protection. His most recent Doppler studies performed 06/19/15 revealed  this to be widely patent.      William Gess MD FACP,FACC,FAHA, [facility] 06/27/2015 2:32 PM

## 2015-06-27 NOTE — Assessment & Plan Note (Signed)
History of hypertension with blood pressure measurements at 137/81. He is on diltiazem, lisinopril and hydrochlorothiazide. Continue current meds at current dosing

## 2015-06-27 NOTE — Assessment & Plan Note (Signed)
History of peripheral arterial disease status post bilateral iliac artery PTA and stenting using "ICast covered stents 06/22/07 for left element claudication. His Dopplers performed 06/08/13 revealed ABIs) 1 bilaterally with mildly elevated iliac velocities however his most recent Dopplers performed 06/15/15 revealed a decline in hi right ABI to 0.65. He also does complain of right lower extremity claudication over the last 6-8 months. Based on this will get formal lower extremity until Doppler studies in anticipation of performing angiography and potential right lower extremity intervention.

## 2015-06-27 NOTE — Patient Instructions (Signed)
Medication Instructions:  Your physician recommends that you continue on your current medications as directed. Please refer to the Current Medication list given to you today.   Labwork: NONE  Testing/Procedures: Your physician has requested that you have a lower extremity arterial doppler- During this test, ultrasound is used to evaluate arterial blood flow in the legs. Allow approximately one hour for this exam.    Follow-Up: Your physician recommends that you schedule a follow-up appointment in: [place].   Any Other Special Instructions Will Be Listed Below (If Applicable).     If you need a refill on your cardiac medications before your next appointment, please call your pharmacy.

## 2015-06-27 NOTE — Assessment & Plan Note (Signed)
history of carotid artery disease status post right carotid endarterectomy performed by Dr. Trula Slade on 09/02/12. Because of aggressive restenosis demonstrated by duplex ultrasound he ultimately underwent right internal carotid artery stenting by myself and Dr. Trula Slade on 05/24/13 using distal protection. His most recent Doppler studies performed 06/19/15 revealed this to be widely patent.

## 2015-06-27 NOTE — Assessment & Plan Note (Addendum)
History of hyperlipidemia on statin therapy followed by his PCP 

## 2015-07-10 ENCOUNTER — Ambulatory Visit (HOSPITAL_COMMUNITY)
Admission: RE | Admit: 2015-07-10 | Discharge: 2015-07-10 | Disposition: A | Discharge status: Home / Self Care | Payer: Medicare Other | Source: Ambulatory Visit | Attending: Cardiology | Admitting: Cardiology

## 2015-07-10 ENCOUNTER — Telehealth: Payer: Self-pay | Admitting: *Deleted

## 2015-07-10 DIAGNOSIS — Z87891 Personal history of nicotine dependence: Secondary | ICD-10-CM | POA: Diagnosis not present

## 2015-07-10 DIAGNOSIS — E119 Type 2 diabetes mellitus without complications: Secondary | ICD-10-CM | POA: Insufficient documentation

## 2015-07-10 DIAGNOSIS — E785 Hyperlipidemia, unspecified: Secondary | ICD-10-CM | POA: Diagnosis not present

## 2015-07-10 DIAGNOSIS — I708 Atherosclerosis of other arteries: Secondary | ICD-10-CM | POA: Insufficient documentation

## 2015-07-10 DIAGNOSIS — I739 Peripheral vascular disease, unspecified: Secondary | ICD-10-CM

## 2015-07-10 DIAGNOSIS — K219 Gastro-esophageal reflux disease without esophagitis: Secondary | ICD-10-CM | POA: Insufficient documentation

## 2015-07-10 DIAGNOSIS — I10 Essential (primary) hypertension: Secondary | ICD-10-CM | POA: Insufficient documentation

## 2015-07-10 NOTE — Telephone Encounter (Signed)
-----   Message from Lorretta Harp, MD sent at 07/10/2015  3:49 PM EDT ----- Regarding: RE: Patient results Needs ROV with me. Can you talk to Anderson Malta? 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   Duplex revealed no stenosis in right lower extremity with monophasic flow in the external iliac, common femoral, profunda, superficial femoral and popliteal arteries, three vessel run-off on the right.

## 2015-07-10 NOTE — Telephone Encounter (Signed)
Patient contacted and appt scheduled for 07/18/15 with Dr Gwenlyn Found. Patient verbalized understanding to come to appt to go over results.

## 2015-07-18 ENCOUNTER — Ambulatory Visit
Admission: RE | Admit: 2015-07-18 | Discharge: 2015-07-18 | Disposition: A | Discharge status: Home / Self Care | Payer: Medicare Other | Source: Ambulatory Visit | Attending: Cardiovascular Disease | Admitting: Cardiovascular Disease

## 2015-07-18 ENCOUNTER — Other Ambulatory Visit: Payer: Self-pay | Admitting: *Deleted

## 2015-07-18 ENCOUNTER — Ambulatory Visit (INDEPENDENT_AMBULATORY_CARE_PROVIDER_SITE_OTHER): Payer: Medicare Other | Admitting: Cardiovascular Disease

## 2015-07-18 ENCOUNTER — Encounter: Payer: Self-pay | Admitting: Cardiovascular Disease

## 2015-07-18 VITALS — BP 168/72 | HR 55 | Ht 65.0 in | Wt 188.0 lb

## 2015-07-18 DIAGNOSIS — I739 Peripheral vascular disease, unspecified: Secondary | ICD-10-CM

## 2015-07-18 DIAGNOSIS — Z79899 Other long term (current) drug therapy: Secondary | ICD-10-CM | POA: Diagnosis not present

## 2015-07-18 DIAGNOSIS — Z01818 Encounter for other preprocedural examination: Secondary | ICD-10-CM

## 2015-07-18 DIAGNOSIS — Z0000 Encounter for general adult medical examination without abnormal findings: Secondary | ICD-10-CM

## 2015-07-18 DIAGNOSIS — I10 Essential (primary) hypertension: Secondary | ICD-10-CM

## 2015-07-18 LAB — BASIC METABOLIC PANEL
BUN: 13 mg/dL (ref 7–25)
CALCIUM: 10 mg/dL (ref 8.6–10.3)
CO2: 26 mmol/L (ref 20–31)
Chloride: 101 mmol/L (ref 98–110)
Creat: 0.74 mg/dL (ref 0.70–1.18)
Glucose, Bld: 180 mg/dL — ABNORMAL HIGH (ref 65–99)
Potassium: 4.6 mmol/L (ref 3.5–5.3)
SODIUM: 140 mmol/L (ref 135–146)

## 2015-07-18 LAB — CBC WITH DIFFERENTIAL/PLATELET
BASOS ABS: 0 {cells}/uL (ref 0–200)
Basophils Relative: 0 %
EOS PCT: 3 %
Eosinophils Absolute: 168 cells/uL (ref 15–500)
HCT: 37.5 % — ABNORMAL LOW (ref 38.5–50.0)
Hemoglobin: 12.2 g/dL — ABNORMAL LOW (ref 13.2–17.1)
Lymphocytes Relative: 19 %
Lymphs Abs: 1064 cells/uL (ref 850–3900)
MCH: 28 pg (ref 27.0–33.0)
MCHC: 32.5 g/dL (ref 32.0–36.0)
MCV: 86.2 fL (ref 80.0–100.0)
MONOS PCT: 9 %
MPV: 10.6 fL (ref 7.5–12.5)
Monocytes Absolute: 504 cells/uL (ref 200–950)
NEUTROS ABS: 3864 {cells}/uL (ref 1500–7800)
NEUTROS PCT: 69 %
PLATELETS: 175 10*3/uL (ref 140–400)
RBC: 4.35 MIL/uL (ref 4.20–5.80)
RDW: 14.6 % (ref 11.0–15.0)
WBC: 5.6 10*3/uL (ref 3.8–10.8)

## 2015-07-18 LAB — TSH: TSH: 1.67 mIU/L (ref 0.40–4.50)

## 2015-07-18 NOTE — Patient Instructions (Addendum)
Medication Instructions:  Your physician recommends that you continue on your current medications as directed. Please refer to the Current Medication list given to you today   Testing/Procedures: Dr. Gwenlyn Found has ordered a peripheral angiogram to be done at [facility].  This procedure is going to look at the bloodflow in your lower extremities.  If Dr. Gwenlyn Found is able to open up the arteries, you will have to spend one night in the hospital.  If he is not able to open the arteries, you will be able to go home that same day.  Thursday, June 29.  After the procedure, you will not be allowed to drive for 3 days or push, pull, or lift anything greater than 10 lbs for one week.    You will be required to have the following tests prior to the procedure:  1. Blood work-the blood work can be done no more than 14 days prior to the procedure.  It can be done at any [facility] lab.  There is one downstairs on the first floor of this building and one in the [facility] building [id number])  2. Chest Xray-the chest xray order has already been placed at the [facility].     *REPS  NONE PER DR BERRY  Puncture site RIGHT GROIN  Do not take your metformin the day before or morning of the procedure. Do not take your glipizide the morning of the procedure.    Any Other Special Instructions Will Be Listed Below (If Applicable).     If you need a refill on your cardiac medications before your next appointment, please call your pharmacy.

## 2015-07-18 NOTE — Assessment & Plan Note (Signed)
History of bilateral iliac artery PTA and covered stenting by myself in 2009. Recent Dopplers have shown a decline in his ABI  To .65 on the right with a high-frequency signal in the origin of his right common iliac artery although he does complain of bilateral lower extremity claudication. I'm going to arrange to perform outpatient abdominal aortography with bifemoral runoff. Since his carotid Dopplers to suggest a high-grade left subclavian artery stenosis we will plan plan on and swimming during the procedure as well.

## 2015-07-18 NOTE — Progress Notes (Signed)
History of bilateral iliac artery PTA and covered stenting by myself in 2009. Recent Dopplers have shown a decline in his ABI  To .65 on the right with a high-frequency signal in the origin of his right common iliac artery although he does complain of bilateral lower extremity claudication. I'm going to arrange to perform outpatient abdominal aortography with bifemoral runoff. Since his carotid Dopplers to suggest a high-grade left subclavian artery stenosis we will plan plan on and swimming during the procedure as well.

## 2015-07-19 LAB — PROTIME-INR
INR: 1
PROTHROMBIN TIME: 11.1 s (ref 9.0–11.5)

## 2015-07-19 LAB — APTT: aPTT: 26 s (ref 22–34)

## 2015-07-20 ENCOUNTER — Telehealth: Payer: Self-pay | Admitting: *Deleted

## 2015-07-20 NOTE — Telephone Encounter (Signed)
Called pt and he agreed to change his PV angiogram date to June 26 at 0900 and to arrive at 0700.  Pt verbalized understanding on date and time.

## 2015-07-20 NOTE — Telephone Encounter (Signed)
Spoke with patient @ 4:29 pm regarding arrival and procedure time change for Monday 07/23/15  Arrive 6 am procedure 7:30 am

## 2015-07-23 ENCOUNTER — Encounter (HOSPITAL_COMMUNITY)
Admission: RE | Disposition: A | Discharge status: Home / Self Care | Payer: Self-pay | Source: Ambulatory Visit | Attending: Cardiovascular Disease

## 2015-07-23 ENCOUNTER — Ambulatory Visit (HOSPITAL_COMMUNITY)
Admission: RE | Admit: 2015-07-23 | Discharge: 2015-07-24 | Disposition: A | Discharge status: Home / Self Care | Payer: Medicare Other | Source: Ambulatory Visit | Attending: Cardiovascular Disease | Admitting: Cardiovascular Disease

## 2015-07-23 ENCOUNTER — Encounter (HOSPITAL_COMMUNITY): Payer: Self-pay | Admitting: General Practice

## 2015-07-23 DIAGNOSIS — T82856A Stenosis of peripheral vascular stent, initial encounter: Secondary | ICD-10-CM | POA: Diagnosis not present

## 2015-07-23 DIAGNOSIS — I10 Essential (primary) hypertension: Secondary | ICD-10-CM | POA: Diagnosis present

## 2015-07-23 DIAGNOSIS — Z7902 Long term (current) use of antithrombotics/antiplatelets: Secondary | ICD-10-CM | POA: Insufficient documentation

## 2015-07-23 DIAGNOSIS — Z87891 Personal history of nicotine dependence: Secondary | ICD-10-CM | POA: Diagnosis not present

## 2015-07-23 DIAGNOSIS — I743 Embolism and thrombosis of arteries of the lower extremities: Secondary | ICD-10-CM | POA: Insufficient documentation

## 2015-07-23 DIAGNOSIS — Z7984 Long term (current) use of oral hypoglycemic drugs: Secondary | ICD-10-CM | POA: Diagnosis not present

## 2015-07-23 DIAGNOSIS — E785 Hyperlipidemia, unspecified: Secondary | ICD-10-CM | POA: Diagnosis not present

## 2015-07-23 DIAGNOSIS — Z7982 Long term (current) use of aspirin: Secondary | ICD-10-CM | POA: Diagnosis not present

## 2015-07-23 DIAGNOSIS — Z79899 Other long term (current) drug therapy: Secondary | ICD-10-CM | POA: Insufficient documentation

## 2015-07-23 DIAGNOSIS — E119 Type 2 diabetes mellitus without complications: Secondary | ICD-10-CM

## 2015-07-23 DIAGNOSIS — Y838 Other surgical procedures as the cause of abnormal reaction of the patient, or of later complication, without mention of misadventure at the time of the procedure: Secondary | ICD-10-CM | POA: Insufficient documentation

## 2015-07-23 DIAGNOSIS — I739 Peripheral vascular disease, unspecified: Secondary | ICD-10-CM

## 2015-07-23 DIAGNOSIS — I70211 Atherosclerosis of native arteries of extremities with intermittent claudication, right leg: Secondary | ICD-10-CM | POA: Diagnosis not present

## 2015-07-23 DIAGNOSIS — I779 Disorder of arteries and arterioles, unspecified: Secondary | ICD-10-CM | POA: Diagnosis present

## 2015-07-23 HISTORY — PX: PERIPHERAL VASCULAR CATHETERIZATION: SHX172C

## 2015-07-23 HISTORY — PX: LEG ANGIOGRAPHY: SHX6672

## 2015-07-23 LAB — GLUCOSE, CAPILLARY
GLUCOSE-CAPILLARY: 162 mg/dL — AB (ref 65–99)
Glucose-Capillary: 250 mg/dL — ABNORMAL HIGH (ref 65–99)
Glucose-Capillary: 227 mg/dL — ABNORMAL HIGH (ref 65–99)
Glucose-Capillary: 295 mg/dL — ABNORMAL HIGH (ref 65–99)

## 2015-07-23 LAB — POCT ACTIVATED CLOTTING TIME
ACTIVATED CLOTTING TIME: 224 s
ACTIVATED CLOTTING TIME: 158 s
Activated Clotting Time: 197 seconds

## 2015-07-23 SURGERY — LOWER EXTREMITY ANGIOGRAPHY
Laterality: Right

## 2015-07-23 MED ORDER — SODIUM CHLORIDE 0.9 % WEIGHT BASED INFUSION: 1.0000 mL/kg/h | INTRAVENOUS | Status: DC

## 2015-07-23 MED ORDER — LIDOCAINE HCL (PF) 1 % IJ SOLN
INTRAMUSCULAR | Status: AC
  Filled 2015-07-23: qty 60

## 2015-07-23 MED ORDER — TERAZOSIN HCL 5 MG PO CAPS
10.0000 mg | ORAL_CAPSULE | Freq: Every day | ORAL | Status: DC
  Administered 2015-07-23: 23:00:00 10 mg via ORAL
  Filled 2015-07-23: qty 2

## 2015-07-23 MED ORDER — SODIUM CHLORIDE 0.45 % IV SOLN
INTRAVENOUS | Status: AC
  Administered 2015-07-23: 10:00:00 via INTRAVENOUS

## 2015-07-23 MED ORDER — NIACIN 500 MG PO TABS
500.0000 mg | ORAL_TABLET | Freq: Every day | ORAL | Status: DC
  Administered 2015-07-24: 500 mg via ORAL
  Filled 2015-07-23: qty 1

## 2015-07-23 MED ORDER — MORPHINE SULFATE (PF) 2 MG/ML IV SOLN: 2.0000 mg | INTRAVENOUS | Status: DC | PRN

## 2015-07-23 MED ORDER — ACETAMINOPHEN 325 MG PO TABS: 650.0000 mg | ORAL_TABLET | ORAL | Status: DC | PRN

## 2015-07-23 MED ORDER — INSULIN ASPART 100 UNIT/ML ~~LOC~~ SOLN
0.0000 [IU] | Freq: Three times a day (TID) | SUBCUTANEOUS | Status: DC
  Administered 2015-07-23: 5 [IU] via SUBCUTANEOUS
  Administered 2015-07-23: 8 [IU] via SUBCUTANEOUS
  Administered 2015-07-24: 3 [IU] via SUBCUTANEOUS

## 2015-07-23 MED ORDER — HYDRALAZINE HCL 20 MG/ML IJ SOLN
INTRAMUSCULAR | Status: DC | PRN
  Administered 2015-07-23 (×2): 10 mg via INTRAVENOUS

## 2015-07-23 MED ORDER — GLIPIZIDE 5 MG PO TABS
10.0000 mg | ORAL_TABLET | Freq: Every day | ORAL | Status: DC
  Administered 2015-07-23 – 2015-07-24 (×2): 10 mg via ORAL
  Filled 2015-07-23 (×2): qty 2

## 2015-07-23 MED ORDER — METFORMIN HCL 500 MG PO TABS: 1000.0000 mg | ORAL_TABLET | Freq: Two times a day (BID) | ORAL | Status: DC

## 2015-07-23 MED ORDER — INSULIN ASPART 100 UNIT/ML ~~LOC~~ SOLN: 0.0000 [IU] | Freq: Every day | SUBCUTANEOUS | Status: DC

## 2015-07-23 MED ORDER — FLAXSEED OIL 1000 MG PO CAPS: 1000.0000 mg | ORAL_CAPSULE | Freq: Every day | ORAL | Status: DC

## 2015-07-23 MED ORDER — ONDANSETRON HCL 4 MG/2ML IJ SOLN: 4.0000 mg | Freq: Four times a day (QID) | INTRAMUSCULAR | Status: DC | PRN

## 2015-07-23 MED ORDER — ZINC GLUCONATE 50 MG PO TABS: 50.0000 mg | ORAL_TABLET | Freq: Every day | ORAL | Status: DC

## 2015-07-23 MED ORDER — GARLIC OIL 1000 MG PO CAPS: 1000.0000 mg | ORAL_CAPSULE | Freq: Every day | ORAL | Status: DC

## 2015-07-23 MED ORDER — PANTOPRAZOLE SODIUM 40 MG PO TBEC
40.0000 mg | DELAYED_RELEASE_TABLET | Freq: Every day | ORAL | Status: DC
  Filled 2015-07-23: qty 1

## 2015-07-23 MED ORDER — CLOPIDOGREL BISULFATE 75 MG PO TABS
75.0000 mg | ORAL_TABLET | Freq: Every day | ORAL | Status: DC
  Filled 2015-07-23: qty 1

## 2015-07-23 MED ORDER — HYDRALAZINE HCL 20 MG/ML IJ SOLN: 10.0000 mg | INTRAMUSCULAR | Status: DC | PRN

## 2015-07-23 MED ORDER — ATORVASTATIN CALCIUM 80 MG PO TABS
80.0000 mg | ORAL_TABLET | Freq: Every day | ORAL | Status: DC
  Administered 2015-07-23: 18:00:00 80 mg via ORAL
  Filled 2015-07-23: qty 1

## 2015-07-23 MED ORDER — CHROMIUM PICOLINATE 500 MCG PO CAPS: 500.0000 mg | ORAL_CAPSULE | Freq: Every day | ORAL | Status: DC

## 2015-07-23 MED ORDER — HEPARIN (PORCINE) IN NACL 2-0.9 UNIT/ML-% IJ SOLN
INTRAMUSCULAR | Status: AC
  Filled 2015-07-23: qty 1000

## 2015-07-23 MED ORDER — SODIUM CHLORIDE 0.9% FLUSH: 3.0000 mL | INTRAVENOUS | Status: DC | PRN

## 2015-07-23 MED ORDER — VITAMIN D 1000 UNITS PO TABS
1000.0000 [IU] | ORAL_TABLET | Freq: Every day | ORAL | Status: DC
  Filled 2015-07-23: qty 1

## 2015-07-23 MED ORDER — VITAMIN C 500 MG PO TABS
500.0000 mg | ORAL_TABLET | Freq: Every day | ORAL | Status: DC
  Filled 2015-07-23: qty 1

## 2015-07-23 MED ORDER — CLOPIDOGREL BISULFATE 75 MG PO TABS: 75.0000 mg | ORAL_TABLET | Freq: Every day | ORAL | Status: DC

## 2015-07-23 MED ORDER — DILTIAZEM HCL ER COATED BEADS 240 MG PO CP24
240.0000 mg | ORAL_CAPSULE | Freq: Every day | ORAL | Status: DC
  Filled 2015-07-23 (×2): qty 1

## 2015-07-23 MED ORDER — ASPIRIN 81 MG PO CHEW
CHEWABLE_TABLET | ORAL | Status: AC
  Filled 2015-07-23: qty 1

## 2015-07-23 MED ORDER — HYDRALAZINE HCL 20 MG/ML IJ SOLN
INTRAMUSCULAR | Status: AC
  Filled 2015-07-23: qty 1

## 2015-07-23 MED ORDER — SODIUM CHLORIDE 0.9 % WEIGHT BASED INFUSION
3.0000 mL/kg/h | INTRAVENOUS | Status: DC
  Administered 2015-07-23: 3 mL/kg/h via INTRAVENOUS

## 2015-07-23 MED ORDER — OMEGA-3-ACID ETHYL ESTERS 1 G PO CAPS
1.0000 g | ORAL_CAPSULE | Freq: Every day | ORAL | Status: DC
  Filled 2015-07-23 (×2): qty 1

## 2015-07-23 MED ORDER — LISINOPRIL-HYDROCHLOROTHIAZIDE 20-12.5 MG PO TABS: 1.0000 | ORAL_TABLET | Freq: Every day | ORAL | Status: DC

## 2015-07-23 MED ORDER — HYDROCHLOROTHIAZIDE 12.5 MG PO CAPS
12.5000 mg | ORAL_CAPSULE | Freq: Every day | ORAL | Status: DC
  Filled 2015-07-23: qty 1

## 2015-07-23 MED ORDER — ASPIRIN EC 325 MG PO TBEC
325.0000 mg | DELAYED_RELEASE_TABLET | Freq: Every day | ORAL | Status: DC
  Filled 2015-07-23: qty 1

## 2015-07-23 MED ORDER — FLUTICASONE PROPIONATE 50 MCG/ACT NA SUSP
1.0000 | Freq: Every day | NASAL | Status: DC
  Filled 2015-07-23: qty 16

## 2015-07-23 MED ORDER — HEPARIN SODIUM (PORCINE) 1000 UNIT/ML IJ SOLN
INTRAMUSCULAR | Status: AC
  Filled 2015-07-23: qty 1

## 2015-07-23 MED ORDER — SODIUM CHLORIDE 0.9 % IV SOLN
INTRAVENOUS | Status: DC | PRN
  Administered 2015-07-23: 85 mL/h via INTRAVENOUS

## 2015-07-23 MED ORDER — IODIXANOL 320 MG/ML IV SOLN
INTRAVENOUS | Status: DC | PRN
  Administered 2015-07-23: 250 mL via INTRA_ARTERIAL

## 2015-07-23 MED ORDER — MAGNESIUM OXIDE 400 (241.3 MG) MG PO TABS
400.0000 mg | ORAL_TABLET | Freq: Every day | ORAL | Status: DC
  Filled 2015-07-23: qty 1

## 2015-07-23 MED ORDER — ASPIRIN EC 81 MG PO TBEC: 81.0000 mg | DELAYED_RELEASE_TABLET | Freq: Every day | ORAL | Status: DC

## 2015-07-23 MED ORDER — SAW PALMETTO (SERENOA REPENS) 160 MG PO CAPS: 160.0000 mg | ORAL_CAPSULE | Freq: Every day | ORAL | Status: DC

## 2015-07-23 MED ORDER — LIDOCAINE HCL (PF) 1 % IJ SOLN
INTRAMUSCULAR | Status: DC | PRN
  Administered 2015-07-23: 22 mL
  Administered 2015-07-23: 25 mL

## 2015-07-23 MED ORDER — LISINOPRIL 10 MG PO TABS
20.0000 mg | ORAL_TABLET | Freq: Every day | ORAL | Status: DC
  Filled 2015-07-23: qty 2

## 2015-07-23 MED ORDER — ASPIRIN 81 MG PO CHEW
81.0000 mg | CHEWABLE_TABLET | ORAL | Status: AC
  Administered 2015-07-23: 81 mg via ORAL

## 2015-07-23 MED ORDER — FERROUS SULFATE 325 (65 FE) MG PO TABS
325.0000 mg | ORAL_TABLET | Freq: Two times a day (BID) | ORAL | Status: DC
  Administered 2015-07-23 – 2015-07-24 (×2): 325 mg via ORAL
  Filled 2015-07-23 (×2): qty 1

## 2015-07-23 MED ORDER — HEPARIN SODIUM (PORCINE) 1000 UNIT/ML IJ SOLN
INTRAMUSCULAR | Status: DC | PRN
  Administered 2015-07-23 (×2): 3000 [IU] via INTRAVENOUS
  Administered 2015-07-23: 2000 [IU] via INTRAVENOUS

## 2015-07-23 SURGICAL SUPPLY — 26 items
BALLN ARMADA 4X40X80 (BALLOONS) ×3
BALLN ARMADA 5X40X80 (BALLOONS) ×3
BALLN ARMADA 7X20X80 (BALLOONS) ×3
BALLOON ARMADA 4X40X80 (BALLOONS) ×2 IMPLANT
BALLOON ARMADA 5X40X80 (BALLOONS) ×2 IMPLANT
BALLOON ARMADA 7X20X80 (BALLOONS) ×2 IMPLANT
BALLOON MUSTANG 4X20X75 (BALLOONS) ×3 IMPLANT
CATH ANGIO 5F BER2 65CM (CATHETERS) ×3 IMPLANT
CATH ANGIO 5F PIGTAIL 100CM (CATHETERS) ×3 IMPLANT
CATH QUICKCROSS SUPP .035X90CM (MICROCATHETER) ×3 IMPLANT
CATH STRAIGHT 5FR 65CM (CATHETERS) ×3 IMPLANT
GLIDEWIRE ANGLED SS 035X260CM (WIRE) ×3 IMPLANT
KIT ENCORE 26 ADVANTAGE (KITS) ×3 IMPLANT
KIT PV (KITS) ×3 IMPLANT
SHEATH BRITE TIP 6FR 35CM (SHEATH) ×3 IMPLANT
SHEATH PINNACLE 5F 10CM (SHEATH) ×6 IMPLANT
SHEATH PINNACLE 6F 10CM (SHEATH) ×3 IMPLANT
STENT LIFESTREAM 6X26X80 (Permanent Stent) ×3 IMPLANT
STOPCOCK MORSE 400PSI 3WAY (MISCELLANEOUS) ×6 IMPLANT
SYRINGE MEDRAD AVANTA MACH 7 (SYRINGE) ×3 IMPLANT
TAPE RADIOPAQUE TURBO (MISCELLANEOUS) ×3 IMPLANT
TRANSDUCER W/STOPCOCK (MISCELLANEOUS) ×3 IMPLANT
TRAY PV CATH (CUSTOM PROCEDURE TRAY) ×3 IMPLANT
TUBING CIL FLEX 10 FLL-RA (TUBING) ×3 IMPLANT
WIRE HITORQ VERSACORE ST 145CM (WIRE) ×3 IMPLANT
WIRE ROSEN-J .035X180CM (WIRE) ×3 IMPLANT

## 2015-07-23 NOTE — Progress Notes (Signed)
Site area: right groin  Site Prior to Removal:  Level 0  Pressure Applied For 20 MINUTES    Minutes Beginning at 1035  Manual:   Yes.    Patient Status During Pull:  Stable, tolerated well   Post Pull Groin Site:  Level 0  Post Pull Instructions Given:  Yes.    Post Pull Pulses Present:  Yes.    Dressing Applied:  Yes.     Patient used urinal after first sheath pulled requested to wait a few minutes before removing second sheath.   Site area: left groin  Site Prior to Removal:  Level 0  Pressure Applied For 20 MINUTES    Minutes Beginning at 1122  Manual:   Yes.    Patient Status During Pull:  Stable, tolerated well   Post Pull Groin Site:  Level 0  Post Pull Instructions Given:  Yes.    Post Pull Pulses Present:  Yes.    Dressing Applied:  Yes.    Comments:  Sites checked frequently during remainder of shift and after bedrest before and after ambulation. No change noted in bilateral sites, dressings remain dry and intact and sites level 0. Peripheral pulses unchanged.

## 2015-07-23 NOTE — Consult Note (Signed)
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 Consult is from Dr. Nanetta Batty.  HPI: William Burke is a 75 y.o. male, who was admitted today for an arteriogram by Dr. Nanetta Batty. He had been having right calf claudication for over a year and occasional paresthesias in the right leg.He has had no symptoms on the left side. He denies any history of rest pain or nonhealing wounds on either foot.   He underwent PTA and placement of covered stents in bilateral common iliac arteries in 2009. On today's study, he had a tight 90% stenosis within the right common iliac artery stent which was ballooned and stented. There was noted to be a filling defect in the right common femoral artery and superficial femoral artery and for this reason vascular surgery was consulted.  Of note, this patient has been followed by Dr. Durene Cal. He is status post right carotid endarterectomy with bovine pericardial patch angioplasty. He required resection and primary anastomosis of the right common carotid artery also. He developed a recurrent right carotid stenosis and tells me that he underwent carotid stenting n April 2015.  Past Medical History  Diagnosis Date  . PAD (peripheral artery disease) (HCC) 06/22/2007    Bilateral iliac artery PTA and stenting with iCAST. Last lower extremity arterial Dopplers 02/25/2011 right ABI 1.0 left ABI 1.1.  Bilateral carotid artery stenosis. Last carotid Dopplers 07/18/2011: Max ICA stenosis 50-69% bilaterally  . Diabetes mellitus, type 2 (HCC)   . Hypertension   . Hyperlipidemia   . History of tobacco abuse   . Carotid artery disease (HCC)     status post right carotid artery stenting 05/24/13  . UTI (lower urinary tract infection)   . GERD (gastroesophageal  reflux disease)     occ  . Complication of anesthesia     sensitive to anesthesia ,longer to awaken-"doesn't require much"  . Arthritis     osteoarthritis knees, elbow right    Family History  Problem Relation Age of Onset  . Diabetes Mother   . Hypertension Mother   . Cerebral aneurysm Mother   . Multiple sclerosis Father   . Kidney disease Father     SOCIAL HISTORY: He is married. He has 6 children. He quit tobacco 30 years ago. Social History   Social History  . Marital Status: Married    Spouse Name: N/A  . Number of Children: N/A  . Years of Education: N/A   Occupational History  . Not on file.   Social History Main Topics  . Smoking status: Former Smoker -- 3.00 packs/day for 25 years    Types: Cigarettes    Quit date: 08/06/1987  . Smokeless tobacco: Never Used  . Alcohol Use: No  . Drug Use: No  . Sexual Activity: Not on file   Other Topics Concern  . Not on file   Social History Narrative    No Known Allergies  Current Facility-Administered Medications  Medication Dose Route Frequency Provider Last Rate Last Dose  . 0.45 % sodium chloride infusion   Intravenous Continuous Runell Gess, MD      . acetaminophen (TYLENOL) tablet 650 mg  650 mg Oral Q4H PRN Runell Gess, MD      . aspirin EC tablet 325 mg  325 mg Oral Daily Delton See  Allyson Sabal, MD   325 mg at 07/23/15 0948  . atorvastatin (LIPITOR) tablet 80 mg  80 mg Oral q1800 Runell Gess, MD      . cholecalciferol (VITAMIN D) tablet 1,000 Units  1,000 Units Oral Daily Runell Gess, MD      . Melene Muller ON 07/24/2015] clopidogrel (PLAVIX) tablet 75 mg  75 mg Oral Daily Runell Gess, MD      . Melene Muller ON 07/24/2015] diltiazem (TIAZAC) 24 hr capsule 240 mg  240 mg Oral Daily Runell Gess, MD      . ferrous sulfate tablet 325 mg  325 mg Oral BID WC Runell Gess, MD      . fluticasone (FLONASE) 50 MCG/ACT nasal spray 1 spray  1 spray Each Nare QHS Runell Gess, MD      . Melene Muller ON  07/24/2015] glipiZIDE (GLUCOTROL) tablet 10 mg  10 mg Oral QAC breakfast Runell Gess, MD      . hydrALAZINE (APRESOLINE) injection 10 mg  10 mg Intravenous Q4H PRN Runell Gess, MD      . Melene Muller ON 07/24/2015] lisinopril (PRINIVIL,ZESTRIL) tablet 20 mg  20 mg Oral Daily Runell Gess, MD       And  . Melene Muller ON 07/24/2015] hydrochlorothiazide (MICROZIDE) capsule 12.5 mg  12.5 mg Oral Daily Runell Gess, MD      . insulin aspart (novoLOG) injection 0-15 Units  0-15 Units Subcutaneous TID WC Runell Gess, MD      . insulin aspart (novoLOG) injection 0-5 Units  0-5 Units Subcutaneous QHS Runell Gess, MD      . magnesium oxide (MAG-OX) tablet 400 mg  400 mg Oral Daily Runell Gess, MD      . Melene Muller ON 07/25/2015] metFORMIN (GLUCOPHAGE) tablet 1,000 mg  1,000 mg Oral BID WC Runell Gess, MD      . morphine 2 MG/ML injection 2 mg  2 mg Intravenous Q1H PRN Runell Gess, MD      . Melene Muller ON 07/24/2015] niacin tablet 500 mg  500 mg Oral Q breakfast Runell Gess, MD      . omega-3 acid ethyl esters (LOVAZA) capsule 1 g  1 g Oral Daily Runell Gess, MD      . ondansetron [facility]) injection 4 mg  4 mg Intravenous Q6H PRN Runell Gess, MD      . Melene Muller ON 07/24/2015] pantoprazole (PROTONIX) EC tablet 40 mg  40 mg Oral Daily Runell Gess, MD      . terazosin (HYTRIN) capsule 10 mg  10 mg Oral QHS Runell Gess, MD      . vitamin C (ASCORBIC ACID) tablet 500 mg  500 mg Oral Daily Runell Gess, MD        REVIEW OF SYSTEMS:  [X]  denotes positive finding, [ ]  denotes negative finding Cardiac  Comments:  Chest pain or chest pressure:    Shortness of breath upon exertion:    Short of breath when lying flat:    Irregular heart rhythm:        Vascular    Pain in calf, thigh, or hip brought on by ambulation:    Pain in feet at night that wakes you up from your sleep:     Blood clot in your veins:    Leg swelling:         Pulmonary    Oxygen at home:      Productive  cough:     Wheezing:         Neurologic    Sudden weakness in arms or legs:     Sudden numbness in arms or legs:     Sudden onset of difficulty speaking or slurred speech:    Temporary loss of vision in one eye:     Problems with dizziness:  X       Gastrointestinal    Blood in stool:     Vomited blood:         Genitourinary    Burning when urinating:     Blood in urine:        Psychiatric    Major depression:         Hematologic    Bleeding problems:    Problems with blood clotting too easily:        Skin    Rashes or ulcers:        Constitutional    Fever or chills:      PHYSICAL EXAM: Filed Vitals:   07/23/15 0925 07/23/15 0930 07/23/15 0945 07/23/15 1000  BP: 125/62 136/53 135/64 148/53  Pulse: 75 74 78 75  Temp:      TempSrc:      Resp: 21 16 16 19   Height:      Weight:      SpO2: 93% 95% 94% 94%    GENERAL: The patient is a well-nourished male, in no acute distress. The vital signs are documented above. CARDIAC: There is a regular rate and rhythm.  VASCULAR: He has bilateral carotid bruits. He has a pressure dressing on the right groin and I am unable to assess his femoral pulse on the right. He does have a right popliteal and dorsalis pedis pulse on the right. I cannot palpate a posterior tibial pulse on the right. On the left side he has a venous sheath and I am unable to assess his left femoral pulse. He does have a palpable posterior tibial pulse on the left foot. I cannot palpate a dorsalis pedis pulse. He has no significant lower extremity swelling. PULMONARY: There is good air exchange bilaterally without wheezing or rales. ABDOMEN: Soft and non-tender with normal pitched bowel sounds.  MUSCULOSKELETAL: There are no major deformities or cyanosis. NEUROLOGIC: No focal weakness or paresthesias are detected. SKIN: There are no ulcers or rashes noted. PSYCHIATRIC: The patient has a normal affect.  DATA:   AORTOGRAM WITH BILATERAL  RUNOFF: I have reviewed his arteriogram from today.The infrarenal aorta is widely patent with some mild ectasia of the distal aorta along the left lateral wall.   On the right side, which is the symptomatic side, there was a 95% proximal right common iliac artery stenosis within the previous stent which was successfully treated with another covered stent. There was some mild residual stenosis at the completion. elow that, the external iliac artery was patent. There is a filling defect in the right common femoral artery and proximal superficial femoral artery which could represent plaque versus chronic thrombus. The deep femoral artery is patent. The superficial femoral artery below the filling defect is patent as is the popliteal artery. There is three-vessel runoff on the right.  On the left side, there is some mild stenosis in the left common iliac artery within the stent. The external iliac on the left, common femoral, superficial femoral, deep femoral, popliteal, and tibial vessels are patent on the left.  CAROTID DUPLEX: I reviewed his carotid duplex scan from 06/19/2015. This  showed bilateral 40-59% carotid stenoses.  MEDICAL ISSUES:  RIGHT COMMON FEMORAL ARTERY AND SUPERFICIAL FEMORAL ARTERY PLAQUE VERSUS CHRONIC THROMBUS: This patient is noted to have a filling defect in the right common femoral artery and proximal superficial femoral artery in the right groin.It is difficult to determine if this is plaque versus chronic thrombus. Given the risk for progression or distal embolization, I think it would be reasonable to consider endarterectomy of the common femoral artery and proximal superficial femoral artery. The patient is previously been followed by Dr. Durene Cal and I will discuss the case with him. The patient is on aspirin and is on Plavix. He states that he has not had vein taken from either leg.   Waverly Ferrari [facility] [place]  [phone]

## 2015-07-23 NOTE — Interval H&P Note (Signed)
History and Physical Interval Note:  07/23/2015 7:29 AM  William Burke  has presented today for surgery, with the diagnosis of claudicatioin   The various methods of treatment have been discussed with the patient and family. After consideration of risks, benefits and other options for treatment, the patient has consented to  Procedure(s): Lower Extremity Angiography (N/A) as a surgical intervention .  The patient's history has been reviewed, patient examined, no change in status, stable for surgery.  I have reviewed the patient's chart and labs.  Questions were answered to the patient's satisfaction.     [name]

## 2015-07-23 NOTE — H&P (View-Only) (Signed)
History of bilateral iliac artery PTA and covered stenting by myself in 2009. Recent Dopplers have shown a decline in his ABI  To .65 on the right with a high-frequency signal in the origin of his right common iliac artery although he does complain of bilateral lower extremity claudication. I'm going to arrange to perform outpatient abdominal aortography with bifemoral runoff. Since his carotid Dopplers to suggest a high-grade left subclavian artery stenosis we will plan plan on and swimming during the procedure as well.

## 2015-07-24 DIAGNOSIS — I739 Peripheral vascular disease, unspecified: Secondary | ICD-10-CM | POA: Diagnosis not present

## 2015-07-24 DIAGNOSIS — T82856A Stenosis of peripheral vascular stent, initial encounter: Secondary | ICD-10-CM | POA: Diagnosis not present

## 2015-07-24 LAB — BASIC METABOLIC PANEL
ANION GAP: 9 (ref 5–15)
BUN: 10 mg/dL (ref 6–20)
CHLORIDE: 102 mmol/L (ref 101–111)
CO2: 26 mmol/L (ref 22–32)
Calcium: 9 mg/dL (ref 8.9–10.3)
Creatinine, Ser: 0.73 mg/dL (ref 0.61–1.24)
GFR calc Af Amer: >60 mL/min (ref >60)
GFR calc non Af Amer: >60 mL/min (ref >60)
GLUCOSE: 153 mg/dL — AB (ref 65–99)
POTASSIUM: 3.6 mmol/L (ref 3.5–5.1)
SODIUM: 137 mmol/L (ref 135–145)

## 2015-07-24 LAB — GLUCOSE, CAPILLARY
Glucose-Capillary: 180 mg/dL — ABNORMAL HIGH (ref 65–99)
Glucose-Capillary: 267 mg/dL — ABNORMAL HIGH (ref 65–99)

## 2015-07-24 LAB — CBC
HEMATOCRIT: 32.6 % — AB (ref 39.0–52.0)
HEMOGLOBIN: 10.5 g/dL — AB (ref 13.0–17.0)
MCH: 27.3 pg (ref 26.0–34.0)
MCHC: 32.2 g/dL (ref 30.0–36.0)
MCV: 84.7 fL (ref 78.0–100.0)
Platelets: 147 10*3/uL — ABNORMAL LOW (ref 150–400)
RBC: 3.85 MIL/uL — ABNORMAL LOW (ref 4.22–5.81)
RDW: 14.2 % (ref 11.5–15.5)
WBC: 6.1 10*3/uL (ref 4.0–10.5)

## 2015-07-24 MED ORDER — ANGIOPLASTY BOOK
Freq: Once | Status: AC
  Administered 2015-07-24: 1
  Filled 2015-07-24: qty 1

## 2015-07-24 MED ORDER — ATORVASTATIN CALCIUM 80 MG PO TABS: 80.0000 mg | ORAL_TABLET | Freq: Every day | ORAL | Status: DC

## 2015-07-24 MED FILL — Heparin Sodium (Porcine) 2 Unit/ML in Sodium Chloride 0.9%: INTRAMUSCULAR | Qty: 1000 | Status: AC

## 2015-07-24 NOTE — Discharge Summary (Signed)
Discharge Summary    Patient ID: HEIDI VICENCIO,  MRN: [id number], DOB/AGE: 08/02/1940 75 y.o.  Admit date: 07/23/2015 Discharge date: 07/24/2015  Primary Care Provider: Marjory Lies A Primary Cardiologist: Allyson Sabal  Discharge Diagnoses    Principal Problem:   Claudication [facility]) Active Problems:   PAD (peripheral artery disease) (HCC)   Diabetes mellitus, type 2 (HCC)   Hypertension   Hyperlipidemia   Carotid artery disease (HCC)   Allergies No Known Allergies  Diagnostic Studies/Procedures   PV Angiogram/Intervention 07/23/15 Angiographic Data:   1: Abdominal aortogram-the abdominal aorta was fluoroscopically calcified. There is no evidence of renal artery stenosis. 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 2: Left lower extremity-there was 30-40% "in-stent restenosis" within the left common iliac artery stent. There was three-vessel runoff 3: Right lower extremity-there was 99% "in-stent restenosis" within the proximal right common iliac artery stent with at least a 50-60 mm gradient. There was a 95% filling defect in the right common femoral, profunda femoris and SFA bifurcation Consistent with chronic thrombus. There was three-vessel runoff.   IMPRESSION:Mr. 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 Cari Caraway , vascular surgeon, who agrees that fixing the inflow would be important prior to considering endarterectomy and patch angioplasty of the common femoral artery. We will proceed with attempt at PTA and stenting of the right common iliac artery.  Procedure Description:I was able to cross The right common iliac Stenosis with a stiff angled 035 Glidewire. I then exchanged the short 5 French sheath for a 6 French bright tip sheath. The patient received a total of 250 mL of  contrast as well as 1000 units of heparin intravenously. The ACT was demonstrated at 224. I predilated the lesion with a 4 mm x 2 cm balloon and then upgraded to a 5 mm x 4 cm balloon. Following this I carefully positioned and deployed a 6 mm x 26 mm long Lifestream covered stent And postdilated with a 7 mm x 2 cm balloon resulting in reduction of a 95-99% stenosis to less than 30% residual.. I exchanged the bike tipped catheter over the wire for a short 6 French sheath. Both sheaths were secured and the patient left the lab in stable condition.   Final Impression: successful PTA and covered stenting for "in-stent restenosis of the right common iliac artery stent for lifestyle limiting claudication. He does have high-grade distal right common femoral, profunda femoris and SFA filling defect consistent with chronic thrombus. This will need to undergo endarterectomy and patch angioplasty by Dr. Cari Caraway who has reviewed the films. The patient will be hydrated overnight. The sheath will be removed once the ACT falls below 170 and pressure held. He is already on dual antiplatelet therapy.   History of Present Illness     The patient is a very pleasant 75 year old  With  history of bilateral iliac artery PTA and stenting Jun 22, 2007 using iCAST covered stents for lifestyle-limiting claudication. This resulted in marked improvement in his symptoms on Dopplers. His other problems include non-insulin-requiring diabetes, hypertension,,  and hyperlipidemia as well as remote tobacco abuse.   A Myoview performed in April of 2009 was nonischemic. An echo revealed normal LV systolic function with mild concentric LVH. He does have bilateral carotid disease s/p right carotid endarterectomy performed on 09/02/12 and right internal carotid artery stenting 05/24/13  using distal protection. His most recent Doppler studies performed 06/19/15 revealed this to be widely patent.  Recent Dopplers have shown a decline in  his ABI To .65 on the right with a high-frequency signal in the origin of his right common iliac artery although he does complain of bilateral lower extremity claudication. Thus presented for outpatient abdominal aortography with bifemoral runoff.   Hospital Course     Consultants: None  S/p successful PTA and covered stenting for "in-stent restenosis of the right common iliac artery stent for lifestyle limiting claudication. He does have high-grade distal right common femoral, profunda femoris and SFA filling defect consistent with chronic thrombus. This will need to undergo endarterectomy and patch angioplasty by Dr. Cari Caraway who has reviewed the films. Plan to f/u with Dr. Myra Gianotti as he has done his CEA. Renal function stable post intervention. Ambulated well. Continue ASA 81mg , Plavix, statin. BP stable. His cholesterol followed by PCP.  Increased lipitor to 80mg  during admission. Will need LFT and lipid panel check in 4-6 weeks.   The patient has been seen by Dr. Tresa Endo  today and deemed ready for discharge home. All follow-up appointments have been scheduled. Discharge medications are listed below.    Discharge Vitals Blood pressure 155/77, pulse 90, temperature 97 F (36.1 C), temperature source Oral, resp. rate 16, height 5\' 5"  (1.651 m), weight 182 lb 15.7 oz (83 kg), SpO2 95 %.  Filed Weights   07/23/15 0614 07/24/15 0558  Weight: 185 lb (83.915 kg) 182 lb 15.7 oz (83 kg)    Labs & Radiologic Studies     CBC  Recent Labs  07/24/15 0311  WBC 6.1  HGB 10.5*  HCT 32.6*  MCV 84.7  PLT 147*   Basic Metabolic Panel  Recent Labs  07/24/15 0311  NA 137  K 3.6  CL 102  CO2 26  GLUCOSE 153*  BUN 10  CREATININE 0.73  CALCIUM 9.0   Liver Function Tests No results for input(s): AST, ALT, ALKPHOS, BILITOT, PROT, ALBUMIN in the last 72 hours. No results for input(s): LIPASE, AMYLASE in the last 72 hours. Cardiac Enzymes No results for input(s): CKTOTAL, CKMB,  CKMBINDEX, TROPONINI in the last 72 hours. BNP Invalid input(s): POCBNP D-Dimer No results for input(s): DDIMER in the last 72 hours. Hemoglobin A1C No results for input(s): HGBA1C in the last 72 hours. Fasting Lipid Panel No results for input(s): CHOL, HDL, LDLCALC, TRIG, CHOLHDL, LDLDIRECT in the last 72 hours. Thyroid Function Tests No results for input(s): TSH, T4TOTAL, T3FREE, THYROIDAB in the last 72 hours.  Invalid input(s): FREET3  Dg Chest 2 View  07/18/2015  CLINICAL DATA:  75 year old male under pre procedural evaluation prior to stent placement in right leg. Former smoker (quit 1989). EXAM: CHEST  2 VIEW COMPARISON:  Chest x-ray 08/30/2012. FINDINGS: Lung volumes are normal. No consolidative airspace disease. No pleural effusions. No pneumothorax. No pulmonary nodule or mass noted. Linear scarring noted in the lingula. Pulmonary vasculature and the cardiomediastinal silhouette are within normal limits. Atherosclerotic calcifications in the thoracic aorta. Multiple old healed left-sided rib fractures. IMPRESSION: 1. No radiographic evidence of acute cardiopulmonary disease. 2. Aortic atherosclerosis. Electronically Signed   By: Trudie Reed M.D.   On: 07/18/2015 13:56    Disposition   Pt is being discharged home today in good condition.  Follow-up Plans & Appointments    Follow-up Information    Follow up with Durene Cal, MD On 08/13/2015.   Specialties:  Vascular Surgery, Cardiology  Why:  @10 :15am    Contact information:   [address] [place] [address] [phone]       Follow up with Nanetta Batty, MD.   Specialties:  Cardiology, Radiology   Why:  office will call with appointment time and date for post procedure evaluation.    Contact information:   [address] [place] [address] [phone]      Discharge Instructions    Diet - low sodium heart healthy    Complete by:  As directed      Discharge instructions     Complete by:  As directed   No driving for 48 hours. No lifting over 5 lbs for 1 week. No sexual activity for 1 week. Keep procedure site clean & dry. If you notice increased pain, swelling, bleeding or pus, call/return!  You may shower, but no soaking baths/hot tubs/pools for 1 week.   Hold Metformin today. You can resume on 07/25/15 evening.     Increase activity slowly    Complete by:  As directed            Discharge Medications   Current Discharge Medication List    CONTINUE these medications which have CHANGED   Details  atorvastatin (LIPITOR) 80 MG tablet Take 1 tablet (80 mg total) by mouth daily at 6 PM. Qty: 30 tablet, Refills: 3      CONTINUE these medications which have NOT CHANGED   Details  aspirin EC 81 MG tablet Take 81 mg by mouth daily.    B Complex-C (SUPER B COMPLEX PO) Take 1 tablet by mouth daily.    cholecalciferol (VITAMIN D) 1000 UNITS tablet Take 1,000 Units by mouth daily.    Chromium Picolinate 500 MCG CAPS Take 500 mg by mouth daily.     CIALIS 5 MG tablet TAKE 1 TABLET every other day at night to hep reduce urination frequency at night Refills: 11    clopidogrel (PLAVIX) 75 MG tablet Take 1 tablet (75 mg total) by mouth daily. Qty: 30 tablet, Refills: 3   Associated Diagnoses: Occlusion and stenosis of carotid artery without mention of cerebral infarction    diltiazem (TIAZAC) 240 MG 24 hr capsule Take 240 mg by mouth daily.     ferrous sulfate 325 (65 FE) MG tablet Take 325 mg by mouth 2 (two) times daily with a meal.    fish oil-omega-3 fatty acids 1000 MG capsule Take 1 g by mouth daily.    Flaxseed, Linseed, (FLAXSEED OIL) 1000 MG CAPS Take 1,000 mg by mouth daily.     fluticasone (FLONASE) 50 MCG/ACT nasal spray Place 1 spray into both nostrils at bedtime.     Garlic Oil 1000 MG CAPS Take 1,000 mg by mouth daily.    glipiZIDE (GLUCOTROL) 10 MG tablet Take 1 tablet by mouth daily before breakfast.     lisinopril-hydrochlorothiazide  (PRINZIDE,ZESTORETIC) 20-12.5 MG tablet Take 1 tablet by mouth daily. Refills: 1    Magnesium Oxide 500 MG TABS Take 500 mg by mouth daily.    metFORMIN (GLUCOPHAGE) 1000 MG tablet Take 1,000 mg by mouth 2 (two) times daily with a meal.    Misc Natural Products (OSTEO BI-FLEX ADV JOINT SHIELD PO) Take 1 tablet by mouth daily.    niacin 500 MG tablet Take 500 mg by mouth daily with breakfast.    omeprazole (PRILOSEC) 20 MG capsule Take 20 mg by mouth 2 (two) times daily. Refills: 1    POTASSIUM GLUCONATE PO Take  99 mg by mouth daily.    saw palmetto 160 MG capsule Take 160 mg by mouth daily.    sitaGLIPtin (JANUVIA) 100 MG tablet Take 50 mg by mouth daily.    terazosin (HYTRIN) 10 MG capsule Take 10 mg by mouth at bedtime.    vitamin C (ASCORBIC ACID) 500 MG tablet Take 500 mg by mouth daily.    zinc gluconate 50 MG tablet Take 50 mg by mouth daily.           Outstanding Labs/Studies   LFT and lipid check in 4-6 weeks  Duration of Discharge Encounter   Greater than 30 minutes including physician time.  Signed, Junior Kenedy PA-C 07/24/2015, 11:27 AM

## 2015-07-24 NOTE — Progress Notes (Signed)
Patient Name: William Burke Date of Encounter: 07/24/2015   SUBJECTIVE  Ambulating well. No chest pain or SOB.   CURRENT MEDS . aspirin EC  325 mg Oral Daily  . atorvastatin  80 mg Oral q1800  . cholecalciferol  1,000 Units Oral Daily  . clopidogrel  75 mg Oral Daily  . diltiazem  240 mg Oral Daily  . ferrous sulfate  325 mg Oral BID WC  . fluticasone  1 spray Each Nare QHS  . glipiZIDE  10 mg Oral QAC breakfast  . lisinopril  20 mg Oral Daily   And  . hydrochlorothiazide  12.5 mg Oral Daily  . insulin aspart  0-15 Units Subcutaneous TID WC  . insulin aspart  0-5 Units Subcutaneous QHS  . magnesium oxide  400 mg Oral Daily  . [START ON 07/25/2015] metFORMIN  1,000 mg Oral BID WC  . niacin  500 mg Oral Q breakfast  . omega-3 acid ethyl esters  1 g Oral Daily  . pantoprazole  40 mg Oral Daily  . terazosin  10 mg Oral QHS  . vitamin C  500 mg Oral Daily    OBJECTIVE  Filed Vitals:   07/23/15 1950 07/24/15 0100 07/24/15 0558 07/24/15 0713  BP: 146/62  139/59 155/77  Pulse: 74  79 90  Temp: 98.4 F (36.9 C)  98.1 F (36.7 C) 97 F (36.1 C)  TempSrc: Oral  Oral Oral  Resp: 21 16 20 16   Height:      Weight:   182 lb 15.7 oz (83 kg)   SpO2: 93%  93% 95%    Intake/Output Summary (Last 24 hours) at 07/24/15 0853 Last data filed at 07/24/15 0759  Gross per 24 hour  Intake 1899.81 ml  Output   1875 ml  Net  24.81 ml   Filed Weights   07/23/15 0614 07/24/15 0558  Weight: 185 lb (83.915 kg) 182 lb 15.7 oz (83 kg)    PHYSICAL EXAM  General: Pleasant, NAD. Neuro: Alert and oriented X 3. Moves all extremities spontaneously. Psych: Normal affect. HEENT:  Normal  Neck: Supple without bruits or JVD. Lungs:  Resp regular and unlabored, CTA. Heart: RRR no s3, s4, or murmurs. Abdomen: Soft, non-tender, non-distended, BS + x 4.  Extremities: No clubbing, cyanosis or edema. DP/PT/Radials 2+ and equal bilaterally.  Bilateral groin cath site without hematoma or bruit.  Left groin cath site has mild bruise.   Accessory Clinical Findings  CBC  Recent Labs  07/24/15 0311  WBC 6.1  HGB 10.5*  HCT 32.6*  MCV 84.7  PLT [id number]*   Basic Metabolic Panel  Recent Labs  07/24/15 0311  NA 137  K 3.6  CL 102  CO2 26  GLUCOSE 153*  BUN 10  CREATININE 0.73  CALCIUM 9.0    TELE  Sinus rhythm with PVCs  PV Angiogram/Intervention 07/23/15 Angiographic Data:   1: Abdominal aortogram-the abdominal aorta was fluoroscopically calcified. There is no evidence of renal artery stenosis. 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 2: Left lower extremity-there was 30-40% "in-stent restenosis" within the left common iliac artery stent. There was three-vessel runoff 3: Right lower extremity-there was 99% "in-stent restenosis" within the proximal right common iliac artery stent with at least a 50-60 mm gradient. There was a 95% filling defect in the right common femoral, profunda femoris and SFA bifurcation Consistent with chronic thrombus. There was three-vessel runoff.   IMPRESSION:William Burke has a high-grade "in-stent restenosis" within the previously placed right common  iliac artery stent as well as chronic thrombus in the distal common femoral, profunda femoris and SFA bifurcation.I review the angiogram with Dr. Gae Gallop , vascular surgeon, who agrees that fixing the inflow would be important prior to considering endarterectomy and patch angioplasty of the common femoral artery. We will proceed with attempt at PTA and stenting of the right common iliac artery.  Procedure Description:I was able to cross The right common iliac Stenosis with a stiff angled 035 Glidewire. I then exchanged the short 5 French sheath for a 6 French bright tip sheath. The patient received a total of 250 mL of contrast as well as 1000 units of heparin intravenously. The ACT was demonstrated at 224. I predilated the lesion with a 4 mm x 2 cm balloon and then upgraded to a 5 mm x 4 cm  balloon. Following this I carefully positioned and deployed a 6 mm x 26 mm long Lifestream covered stent And postdilated with a 7 mm x 2 cm balloon resulting in reduction of a 95-99% stenosis to less than 30% residual.. I exchanged the bike tipped catheter over the wire for a short 6 French sheath. Both sheaths were secured and the patient left the lab in stable condition.   Final Impression: successful PTA and covered stenting for "in-stent restenosis of the right common iliac artery stent for lifestyle limiting claudication. He does have high-grade distal right common femoral, profunda femoris and SFA filling defect consistent with chronic thrombus. This will need to undergo endarterectomy and patch angioplasty by Dr. Gae Gallop who has reviewed the films. The patient will be hydrated overnight. The sheath will be removed once the ACT falls below 170 and pressure held. He is already on dual antiplatelet therapy.  Radiology/Studies  Dg Chest 2 View  07/18/2015  CLINICAL DATA:  75 year old male under pre procedural evaluation prior to stent placement in right leg. Former smoker (quit 1989). EXAM: CHEST  2 VIEW COMPARISON:  Chest x-ray 08/30/2012. FINDINGS: Lung volumes are normal. No consolidative airspace disease. No pleural effusions. No pneumothorax. No pulmonary nodule or mass noted. Linear scarring noted in the lingula. Pulmonary vasculature and the cardiomediastinal silhouette are within normal limits. Atherosclerotic calcifications in the thoracic aorta. Multiple old healed left-sided rib fractures. IMPRESSION: 1. No radiographic evidence of acute cardiopulmonary disease. 2. Aortic atherosclerosis. Electronically Signed   By: Vinnie Langton M.D.   On: 07/18/2015 13:56    ASSESSMENT AND PLAN  1. Claudication [facility]) - s/p successful PTA and covered stenting for "in-stent restenosis of the right common iliac artery stent for lifestyle limiting claudication. - He does have high-grade distal  right common femoral, profunda femoris and SFA filling defect consistent with chronic thrombus. This will need to undergo endarterectomy and patch angioplasty by Dr. Gae Gallop who has reviewed the films. Plan to f/u with Dr. Trula Slade as he has done previous intervention.  - Renal function stable post intervention.  - Continue ASA 81mg , Plavix, statin.    2. PAD - History of peripheral arterial disease status post bilateral iliac artery PTA and stenting using "Cast covered stents 06/22/07 for left element claudication".    3. HTN - relatively stable. Continue current regimen.   4.HLD - Followed by PCP. Continue statin.   5. Carotid artery disease  - s/p  right carotid endarterectomy performed  on 09/02/12 and  right internal carotid artery stenting 05/24/13 using distal protection. His most recent Doppler studies performed 06/19/15 revealed this to be widely patent.  Signed, Bhagat,Bhavinkumar PA-C  Pager [phone]   Patient seen and examined. Agree with assessment and plan. Feels well; no chest pain or dyspnea. Groin sites are stable; s/p PTA and covered stent to R common iliac artery. Since Dr. Trula Slade had done his CEA; f/u with Dr. Trula Slade per Dr. Scot Dock. Dc today.   Troy Sine, MD, [facility] 07/24/2015 10:51 AM

## 2015-07-30 ENCOUNTER — Telehealth: Payer: Self-pay | Admitting: Cardiovascular Disease

## 2015-08-01 NOTE — Telephone Encounter (Signed)
Closed encounter °

## 2015-08-03 ENCOUNTER — Telehealth: Payer: Self-pay | Admitting: Surgery

## 2015-08-03 NOTE — Telephone Encounter (Signed)
-----   Message from Mena Goes, RN sent at 08/03/2015  9:28 AM EDT ----- Regarding: ASAP appt per [facility]    ----- Message -----    From: Dario Ave    Sent: 08/03/2015   7:56 AM      To: [facility] Charge Pool Subject: FW: JB consult---Kay's log                       ----- Message -----    From: Serafina Mitchell, MD    Sent: 08/02/2015   5:16 PM      To: [facility] Charge Pool Subject: FW: JB consult                                 Please schedule this patient to see me ASAP for discussions of right femoral endarterectomy.  He should be a new patient  ----- Message -----    From: Angelia Mould, MD    Sent: 07/23/2015  10:49 AM      To: Serafina Mitchell, MD Subject: Hiram Comber consult                                     Chauncy Passy asked me to see this patient in consult today. I don't think he remembered that you had operated on him before. I sent you a copy of my note that it looks like he needs a right common femoral artery endarterectomy and vein patch. We can discuss this tomorrow when you are at the hospital. Gerald Stabs

## 2015-08-03 NOTE — Telephone Encounter (Signed)
Pt already has first avail 7/17 at 10:15.

## 2015-08-09 ENCOUNTER — Encounter: Payer: Self-pay | Admitting: Surgery

## 2015-08-13 ENCOUNTER — Ambulatory Visit (INDEPENDENT_AMBULATORY_CARE_PROVIDER_SITE_OTHER): Payer: Medicare Other | Admitting: Surgery

## 2015-08-13 ENCOUNTER — Encounter: Payer: Self-pay | Admitting: Surgery

## 2015-08-13 VITALS — BP 165/83 | HR 79 | Temp 97.7°F | Resp 16 | Ht 65.0 in | Wt 188.0 lb

## 2015-08-13 DIAGNOSIS — I70211 Atherosclerosis of native arteries of extremities with intermittent claudication, right leg: Secondary | ICD-10-CM | POA: Diagnosis not present

## 2015-08-13 NOTE — Progress Notes (Signed)
[facility] [place]  Patient name: William Burke MRN: [id number] DOB: 1940/09/25 Sex: male  REASON FOR VISIT: right femoral clot  HPI: William Burke is a 75 y.o. male who is well known to me, having undergone right carotid endarterectomy and patch angioplasty right carotid stent for recurrent stenosis in 2014 and 15.  He has a history of peripheral vascular disease.  In June 2017 he underwent angiography by Dr. Gery Pray.  He was having claudication.  He has a history of bilateral iliac stents.  He was found to have a high-grade 90% stenosis within the right common iliac artery which was treated.  Patient has had resolution of his symptoms.  A filling defect was visualized within the right common femoral and superficial femoral artery.  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  Patient has history of diabetes type 2.  He suffers from hypercholesterolemia which is managed with a statin.  He is on ACE inhibitor for hypertension.  He is a former smoker.  He is on dual antiplatelet therapy.    Past Medical History  Diagnosis Date  . PAD (peripheral artery disease) (HCC) 06/22/2007    Bilateral iliac artery PTA and stenting with iCAST. Last lower extremity arterial Dopplers 02/25/2011 right ABI 1.0 left ABI 1.1.  Bilateral carotid artery stenosis. Last carotid Dopplers 07/18/2011: Max ICA stenosis 50-69% bilaterally  . Diabetes mellitus, type 2 (HCC)   . Hypertension   . Hyperlipidemia   . History of tobacco abuse   . Carotid artery disease (HCC)     status post right carotid artery stenting 05/24/13  . UTI (lower urinary tract infection)   . GERD (gastroesophageal reflux disease)     occ  . Complication of anesthesia     sensitive to anesthesia ,longer to awaken-"doesn't require much"  . Arthritis     osteoarthritis knees, elbow right    Family History  Problem Relation Age of Onset  . Diabetes Mother   . Hypertension Mother   .  Cerebral aneurysm Mother   . Multiple sclerosis Father   . Kidney disease Father     SOCIAL HISTORY: Social History  Substance Use Topics  . Smoking status: Former Smoker -- 3.00 packs/day for 25 years    Types: Cigarettes    Quit date: 08/06/1987  . Smokeless tobacco: Never Used  . Alcohol Use: No    No Known Allergies  Current Outpatient Prescriptions  Medication Sig Dispense Refill  . aspirin EC 81 MG tablet Take 81 mg by mouth daily.    [name] atorvastatin (LIPITOR) 80 MG tablet Take 1 tablet (80 mg total) by mouth daily at 6 PM. 30 tablet 3  . B Complex-C (SUPER B COMPLEX PO) Take 1 tablet by mouth daily.    . cholecalciferol (VITAMIN D) 1000 UNITS tablet Take 1,000 Units by mouth daily.    . Chromium Picolinate 500 MCG CAPS Take 500 mg by mouth daily.     . clopidogrel (PLAVIX) 75 MG tablet Take 1 tablet (75 mg total) by mouth daily. 30 tablet 3  . diltiazem (TIAZAC) 240 MG 24 hr capsule Take 240 mg by mouth daily.     . ferrous sulfate 325 (65 FE) MG tablet Take 325 mg by mouth 2 (two) times daily with a meal.    . fish oil-omega-3 fatty acids 1000 MG capsule Take 1 g by mouth daily.    . Flaxseed, Linseed, (FLAXSEED OIL) 1000 MG CAPS Take 1,000 mg by mouth daily.     [name]  fluticasone (FLONASE) 50 MCG/ACT nasal spray Place 1 spray into both nostrils at bedtime.     . Garlic Oil 1000 MG CAPS Take 1,000 mg by mouth daily.    [name] glipiZIDE (GLUCOTROL) 10 MG tablet Take 1 tablet by mouth daily before breakfast.     . lisinopril-hydrochlorothiazide (PRINZIDE,ZESTORETIC) 20-12.5 MG tablet Take 1 tablet by mouth daily.  1  . Magnesium Oxide 500 MG TABS Take 500 mg by mouth daily.    . metFORMIN (GLUCOPHAGE) 1000 MG tablet Take 1,000 mg by mouth 2 (two) times daily with a meal.    . Misc Natural Products (OSTEO BI-FLEX ADV JOINT SHIELD PO) Take 1 tablet by mouth daily.    . niacin 500 MG tablet Take 500 mg by mouth daily with breakfast.    . omeprazole (PRILOSEC) 20 MG capsule Take 20 mg by  mouth 2 (two) times daily.  1  . POTASSIUM GLUCONATE PO Take 99 mg by mouth daily.    . saw palmetto 160 MG capsule Take 160 mg by mouth daily.    . sitaGLIPtin (JANUVIA) 100 MG tablet Take 50 mg by mouth daily.    [name] terazosin (HYTRIN) 10 MG capsule Take 10 mg by mouth at bedtime.    . vitamin C (ASCORBIC ACID) 500 MG tablet Take 500 mg by mouth daily.    [name] zinc gluconate 50 MG tablet Take 50 mg by mouth daily.    [name] CIALIS 5 MG tablet Reported on 08/13/2015  11   No current facility-administered medications for this visit.    REVIEW OF SYSTEMS:  [X]  denotes positive finding, [ ]  denotes negative finding Cardiac  Comments:  Chest pain or chest pressure:    Shortness of breath upon exertion:    Short of breath when lying flat:    Irregular heart rhythm:        Vascular    Pain in calf, thigh, or hip brought on by ambulation: x hip  Pain in feet at night that wakes you up from your sleep:     Blood clot in your veins: x   Leg swelling:         Pulmonary    Oxygen at home:    Productive cough:  x   Wheezing:         Neurologic    Sudden weakness in arms or legs:     Sudden numbness in arms or legs:     Sudden onset of difficulty speaking or slurred speech:    Temporary loss of vision in one eye:     Problems with dizziness:         Gastrointestinal    Blood in stool:     Vomited blood:         Genitourinary    Burning when urinating:     Blood in urine:        Psychiatric    Major depression:         Hematologic    Bleeding problems:    Problems with blood clotting too easily:        Skin    Rashes or ulcers:        Constitutional    Fever or chills:      PHYSICAL EXAM: Filed Vitals:   08/13/15 1029 08/13/15 1032  BP: 185/81 165/83  Pulse: 79   Temp: 97.7 F (36.5 C)   TempSrc: Oral   Resp: 16   Height: 5\' 5"  (1.651 m)   Weight:  188 lb (85.276 kg)   SpO2: 96%     GENERAL: The patient is a well-nourished male, in no acute distress. The vital signs  are documented above. CARDIAC: There is a regular rate and rhythm.  VASCULAR: palpable femoral pulse PULMONARY: There is good air exchange bilaterally without wheezing or rales. ABDOMEN: Diastases in the upper abdomen MUSCULOSKELETAL: There are no major deformities or cyanosis. NEUROLOGIC: No focal weakness or paresthesias are detected. SKIN: There are no ulcers or rashes noted. PSYCHIATRIC: The patient has a normal affect.  DATA:  I have reviewed the patient's arteriogram compared to the study done at 2009.  The filling defect in the right femoral artery was not present in 2009.  This does not appear to be mobile.  I suspect this is at least subacute, possibly chronic  MEDICAL ISSUES: Right femoral thrombus: The patient is not symptomatic from his claudication.  He did have excellent symptomatic relief from his most recent intervention.  On the imaging studies, a filling defect was seen in the right groin.  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  I do not think there is any way to be absolutely sure.  I discussed our treatment options with the patient and his wife.  I believe there is concern for propagation of this process and potential embolization.  For that reason I have recommended proceeding with right femoral endarterectomy with patch angioplasty.  I will stop his Plavix 5 days before the operation.  We discussed the risks and benefits of the procedure including but not limited to the risk of wound complications, bleeding, and recurrence.  I will evaluate the lesion location with ultrasound in the operating room to determine whether or not to make an oblique or longitudinal incision.  I suspect a longitudinal incision will be required.  His operation has been scheduled for Wednesday, August 2    Durene Cal, MD [facility] [phone] Pager [phone]

## 2015-08-14 ENCOUNTER — Other Ambulatory Visit: Payer: Self-pay

## 2015-08-20 ENCOUNTER — Telehealth: Payer: Self-pay | Admitting: Cardiovascular Disease

## 2015-08-20 NOTE — Telephone Encounter (Signed)
New message    Patient calling upcoming surgery on  8.2 . Should he come to his appt on  7.25 or wait until his surgery is complete with Dr. Glynda Jaeger.

## 2015-08-20 NOTE — Telephone Encounter (Signed)
Returned pt call-advised pt to keep his appt with MD Gwenlyn Found at this time prior to surgery.  Pt verbalized understanding.

## 2015-08-21 ENCOUNTER — Ambulatory Visit (INDEPENDENT_AMBULATORY_CARE_PROVIDER_SITE_OTHER): Payer: Medicare Other | Admitting: Cardiovascular Disease

## 2015-08-21 ENCOUNTER — Encounter: Payer: Self-pay | Admitting: Cardiovascular Disease

## 2015-08-21 DIAGNOSIS — I739 Peripheral vascular disease, unspecified: Secondary | ICD-10-CM

## 2015-08-21 NOTE — Patient Instructions (Signed)
Medication Instructions:  Your physician recommends that you continue on your current medications as directed. Please refer to the Current Medication list given to you today.   Follow-Up: Your physician recommends that you schedule a follow-up appointment in: 3 MONTHS WITH DR BERRY.  If you need a refill on your cardiac medications before your next appointment, please call your pharmacy.   

## 2015-08-21 NOTE — Progress Notes (Signed)
08/21/2015 William Burke   June 28, 1940  [id number]  Primary Physician William Lek, MD Primary Cardiologist: William Gess MD William Burke, [place]  HPI:  The patient is a very pleasant 75 year old mildly to moderately overweight married Caucasian male father of 64, grandfather to 6 grandchildren who I saw 07/18/15.[name] He has a history of bilateral iliac artery PTA and stenting by myself Jun 22, 2007 using iCAST covered stents for lifestyle-limiting claudication. This resulted in marked improvement in his symptoms on Dopplers. His other problems include non-insulin-requiring diabetes, hypertension and hyperlipidemia as well as remote tobacco abuse. He denies chest pain or shortness of breath. A Myoview performed in April of 2009 was nonischemic. An echo revealed normal LV systolic function with mild concentric LVH. He does have bilateral carotid disease which by duplex ultrasound performed this past June showed progression of disease on both sides. He is neurologically asymptomatic. Recent lower extremity Dopplers performed 06/24/12 showed his stents to be widely patent.carotid Dopplers performed on the same day showed significant progression of disease on the right side now with a hemodynamic significant stenosis. Most recent lab work performed earlier this month revealed a total cholesterol of 157, LDL of 89, and HDL of 38.  He had carotid Dopplers performed 08/06/12 that showed significant progression of disease on the right side now in the critical range. He does complain of some "tingling" in his right cheek. He is intolerant to Plavix because of epistaxis. William Burke and I performed right internal carotid artery stenting on William Burke 05/24/13 using distal embolic protection. The angiographic and clinical result excellent. Patient denies chest pain or shortness of breath but does complain of relatively new onset right lower extremity claudication. His ABI has fallen 2.65 on the right which  is significant compared to his prior Doppler 2015 when it was 0.95.He has had a left total knee replacement performed by William Burke in December of last year. Because of progressive claudication and Dopplers that suggested a high-grade right internal iliac stenosis with a decline in his right ABI to .65. Angiogram him on 07/23/15 revealing 99% "in-stent restenosis within the previously placed right common iliac artery stent. I restented him with a Lifestream covered stent resulting in reduction of his stenosis to 30% residual. He did have 95% thrombotic occlusion of his distal right common femoral, proximal SFA and profunda branches. He is scheduled to have this surgically removed by William Burke next week. His claudication has completely resolved.   Current Outpatient Prescriptions  Medication Sig Dispense Refill  . aspirin EC 81 MG tablet Take 81 mg by mouth daily.    [name] atorvastatin (LIPITOR) 80 MG tablet Take 1 tablet (80 mg total) by mouth daily at 6 PM. 30 tablet 3  . clopidogrel (PLAVIX) 75 MG tablet Take 1 tablet (75 mg total) by mouth daily. 30 tablet 3  . diltiazem (TIAZAC) 240 MG 24 hr capsule Take 240 mg by mouth daily.     . ferrous sulfate 325 (65 FE) MG tablet Take 325 mg by mouth 2 (two) times daily with a meal.    . fluticasone (FLONASE) 50 MCG/ACT nasal spray Place 1 spray into both nostrils at bedtime.     [name] glipiZIDE (GLUCOTROL) 10 MG tablet Take 1 tablet by mouth daily before breakfast.     . lisinopril-hydrochlorothiazide (PRINZIDE,ZESTORETIC) 20-12.5 MG tablet Take 1 tablet by mouth daily.  1  . metFORMIN (GLUCOPHAGE) 1000 MG tablet Take 1,000 mg by mouth 2 (two) times daily  with a meal.    . omeprazole (PRILOSEC) 20 MG capsule Take 20 mg by mouth 2 (two) times daily.  1  . sitaGLIPtin (JANUVIA) 100 MG tablet Take 50 mg by mouth daily.    [name] terazosin (HYTRIN) 10 MG capsule Take 10 mg by mouth at bedtime.     No current facility-administered medications for this visit.     No  Known Allergies  Social History   Social History  . Marital status: Married    Spouse name: N/A  . Number of children: N/A  . Years of education: N/A   Occupational History  . Not on file.   Social History Main Topics  . Smoking status: Former Smoker    Packs/day: 3.00    Years: 25.00    Types: Cigarettes    Quit date: 08/06/1987  . Smokeless tobacco: Never Used  . Alcohol use No  . Drug use: No  . Sexual activity: Not on file   Other Topics Concern  . Not on file   Social History Narrative  . No narrative on file     Review of Systems: General: negative for chills, fever, night sweats or weight changes.  Cardiovascular: negative for chest pain, dyspnea on exertion, edema, orthopnea, palpitations, paroxysmal nocturnal dyspnea or shortness of breath Dermatological: negative for rash Respiratory: negative for cough or wheezing Urologic: negative for hematuria Abdominal: negative for nausea, vomiting, diarrhea, bright red blood per rectum, melena, or hematemesis Neurologic: negative for visual changes, syncope, or dizziness All other systems reviewed and are otherwise negative except as noted above.    Blood pressure (!) 151/76, pulse 63, height 5\' 5"  (1.651 m), weight 188 lb (85.3 kg).  General appearance: alert and no distress Neck: no adenopathy, no JVD, supple, symmetrical, trachea midline, thyroid not enlarged, symmetric, no tenderness/mass/nodules and Bilateral carotid bruits Lungs: no adenopathy, no JVD, supple, symmetrical, trachea midline, thyroid not enlarged, symmetric, no tenderness/mass/nodules and Bilateral carotid bruits Heart: regular rate and rhythm, S1, S2 normal, no murmur, click, rub or gallop Extremities: extremities normal, atraumatic, no cyanosis or edema  EKG not performed today  ASSESSMENT AND PLAN:   PAD (peripheral artery disease) (HCC) History of peripheral arterial disease status post bilateral iliac artery PTA and stenting by myself  06/22/07 using ICast Stents. This did result in significant improvement in his claudication. Because of recurrent symptoms Dopplers were recently performed that showed a decline in his right ABI 0.65. Angiogram him on 07/23/15 revealing a 99% "in-stent restenosis within the right common iliac artery stent which I restented. He did have thrombotic sub-occlusion of his distal common femoral, profunda and proximal right SFA. His claudication has since resolved. He is scheduled for thrombectomy and patch angioplasty next week with William Burke.      William Gess MD [facility], [facility] 08/21/2015 11:58 AM

## 2015-08-21 NOTE — Assessment & Plan Note (Signed)
History of peripheral arterial disease status post bilateral iliac artery PTA and stenting by myself 06/22/07 using ICast Stents. This did result in significant improvement in his claudication. Because of recurrent symptoms Dopplers were recently performed that showed a decline in his right ABI 0.65. Angiogram him on 07/23/15 revealing a 99% "in-stent restenosis within the right common iliac artery stent which I restented. He did have thrombotic sub-occlusion of his distal common femoral, profunda and proximal right SFA. His claudication has since resolved. He is scheduled for thrombectomy and patch angioplasty next week with Dr. Trula Slade.

## 2015-08-23 ENCOUNTER — Encounter (HOSPITAL_COMMUNITY): Payer: Self-pay

## 2015-08-23 ENCOUNTER — Encounter (HOSPITAL_COMMUNITY)
Admission: RE | Admit: 2015-08-23 | Discharge: 2015-08-23 | Disposition: A | Discharge status: Home / Self Care | Payer: Medicare Other | Source: Ambulatory Visit | Attending: Surgery | Admitting: Surgery

## 2015-08-23 DIAGNOSIS — Z01818 Encounter for other preprocedural examination: Secondary | ICD-10-CM | POA: Insufficient documentation

## 2015-08-23 DIAGNOSIS — Z95820 Peripheral vascular angioplasty status with implants and grafts: Secondary | ICD-10-CM | POA: Insufficient documentation

## 2015-08-23 DIAGNOSIS — K219 Gastro-esophageal reflux disease without esophagitis: Secondary | ICD-10-CM | POA: Diagnosis not present

## 2015-08-23 DIAGNOSIS — Z01812 Encounter for preprocedural laboratory examination: Secondary | ICD-10-CM | POA: Insufficient documentation

## 2015-08-23 DIAGNOSIS — Z87891 Personal history of nicotine dependence: Secondary | ICD-10-CM | POA: Insufficient documentation

## 2015-08-23 DIAGNOSIS — Z0183 Encounter for blood typing: Secondary | ICD-10-CM | POA: Insufficient documentation

## 2015-08-23 DIAGNOSIS — Z7982 Long term (current) use of aspirin: Secondary | ICD-10-CM | POA: Insufficient documentation

## 2015-08-23 DIAGNOSIS — Z7984 Long term (current) use of oral hypoglycemic drugs: Secondary | ICD-10-CM | POA: Diagnosis not present

## 2015-08-23 DIAGNOSIS — I739 Peripheral vascular disease, unspecified: Secondary | ICD-10-CM | POA: Insufficient documentation

## 2015-08-23 DIAGNOSIS — E119 Type 2 diabetes mellitus without complications: Secondary | ICD-10-CM | POA: Diagnosis not present

## 2015-08-23 DIAGNOSIS — Z7902 Long term (current) use of antithrombotics/antiplatelets: Secondary | ICD-10-CM | POA: Diagnosis not present

## 2015-08-23 DIAGNOSIS — Z79899 Other long term (current) drug therapy: Secondary | ICD-10-CM | POA: Insufficient documentation

## 2015-08-23 DIAGNOSIS — E785 Hyperlipidemia, unspecified: Secondary | ICD-10-CM | POA: Insufficient documentation

## 2015-08-23 DIAGNOSIS — I10 Essential (primary) hypertension: Secondary | ICD-10-CM | POA: Insufficient documentation

## 2015-08-23 HISTORY — DX: Anemia, unspecified: D64.9

## 2015-08-23 HISTORY — DX: Presence of spectacles and contact lenses: Z97.3

## 2015-08-23 HISTORY — DX: Personal history of other diseases of the respiratory system: Z87.09

## 2015-08-23 HISTORY — DX: Nocturia: R35.1

## 2015-08-23 HISTORY — DX: Personal history of pneumonia (recurrent): Z87.01

## 2015-08-23 LAB — APTT: APTT: 30 s (ref 24–36)

## 2015-08-23 LAB — URINE MICROSCOPIC-ADD ON: RBC / HPF: NONE SEEN RBC/hpf (ref 0–5)

## 2015-08-23 LAB — COMPREHENSIVE METABOLIC PANEL
ALBUMIN: 4 g/dL (ref 3.5–5.0)
ALT: 34 U/L (ref 17–63)
AST: 35 U/L (ref 15–41)
Alkaline Phosphatase: 46 U/L (ref 38–126)
Anion gap: 12 (ref 5–15)
BILIRUBIN TOTAL: 0.7 mg/dL (ref 0.3–1.2)
BUN: 18 mg/dL (ref 6–20)
CALCIUM: 9.6 mg/dL (ref 8.9–10.3)
CO2: 24 mmol/L (ref 22–32)
CREATININE: 0.85 mg/dL (ref 0.61–1.24)
Chloride: 101 mmol/L (ref 101–111)
GFR calc Af Amer: >60 mL/min (ref >60)
GFR calc non Af Amer: >60 mL/min (ref >60)
GLUCOSE: 162 mg/dL — AB (ref 65–99)
POTASSIUM: 4.3 mmol/L (ref 3.5–5.1)
SODIUM: 137 mmol/L (ref 135–145)
TOTAL PROTEIN: 6.7 g/dL (ref 6.5–8.1)

## 2015-08-23 LAB — URINALYSIS, ROUTINE W REFLEX MICROSCOPIC
Bilirubin Urine: NEGATIVE
GLUCOSE, UA: NEGATIVE mg/dL
Hgb urine dipstick: NEGATIVE
Ketones, ur: 15 mg/dL — AB
Nitrite: NEGATIVE
PROTEIN: NEGATIVE mg/dL
SPECIFIC GRAVITY, URINE: 1.026 (ref 1.005–1.030)
pH: 5 (ref 5.0–8.0)

## 2015-08-23 LAB — CBC
HCT: 37.1 % — ABNORMAL LOW (ref 39.0–52.0)
Hemoglobin: 12.2 g/dL — ABNORMAL LOW (ref 13.0–17.0)
MCH: 28.5 pg (ref 26.0–34.0)
MCHC: 32.9 g/dL (ref 30.0–36.0)
MCV: 86.7 fL (ref 78.0–100.0)
Platelets: 135 10*3/uL — ABNORMAL LOW (ref 150–400)
RBC: 4.28 MIL/uL (ref 4.22–5.81)
RDW: 14.4 % (ref 11.5–15.5)
WBC: 5.8 10*3/uL (ref 4.0–10.5)

## 2015-08-23 LAB — TYPE AND SCREEN
ABO/RH(D): A POS
Antibody Screen: NEGATIVE

## 2015-08-23 LAB — PROTIME-INR
INR: 1.13
PROTHROMBIN TIME: 14.6 s (ref 11.4–15.2)

## 2015-08-23 LAB — SURGICAL PCR SCREEN
MRSA, PCR: NEGATIVE
STAPHYLOCOCCUS AUREUS: NEGATIVE

## 2015-08-23 LAB — GLUCOSE, CAPILLARY: GLUCOSE-CAPILLARY: 183 mg/dL — AB (ref 65–99)

## 2015-08-23 NOTE — Pre-Procedure Instructions (Signed)
William Burke  08/23/2015      [id number] - [place], [place] - [address] [place] [address] Phone: [phone] Fax: [place], [place] - [id number] [place] [address] Phone: [phone] Fax: [phone]    Your procedure is scheduled on Thursday, August 3rd, 2017.  Report to [facility] Admitting at 9:00 A.M.   Call this number if you have problems the morning of surgery:  [phone]   Remember:  Do not eat food or drink liquids after midnight.   Take these medicines the morning of surgery with A SIP OF WATER: Aspirin, Diltiazem (Tiazac), Omeprazole (Prilosec).    WHAT DO I DO ABOUT MY DIABETES MEDICATION?  [name] Do not take oral diabetes medicines (pills) the morning of surgery.  Do NOT take Glipizide (Glucotrol), Metformin (Glucophage), or Sitagliptin (Januvia) the morning of surgery.    5 days prior to surgery, stop taking: Clopidogrel (Plavix), Aleve, Naproxen, Ibuprofen, Advil, Motrin, BC's, Goody's, Fish oil, all herbal medications, and all vitamins.      Do not wear jewelry.  Do not wear lotions, powders, or colognes.    Men may shave face and neck.  Do not bring valuables to the hospital.  [facility] is not responsible for any belongings or valuables.  Contacts, dentures or bridgework may not be worn into surgery.  Leave your suitcase in the car.  After surgery it may be brought to your room.  For patients admitted to the hospital, discharge time will be determined by your treatment team.  Patients discharged the day of surgery will not be allowed to drive home.   Special instructions:  Preparing for Surgery.   Please read over the following fact sheets that you were given. MRSA Information      How to Manage Your Diabetes Before and After Surgery  Why is it important to control my blood sugar before  and after surgery? . Improving blood sugar levels before and after surgery helps healing and can limit problems. . A way of improving blood sugar control is eating a healthy diet by: o  Eating less sugar and carbohydrates o  Increasing activity/exercise o  Talking with your doctor about reaching your blood sugar goals . High blood sugars (greater than 180 mg/dL) can raise your risk of infections and slow your recovery, so you will need to focus on controlling your diabetes during the weeks before surgery. . Make sure that the doctor who takes care of your diabetes knows about your planned surgery including the date and location.  How do I manage my blood sugar before surgery? . Check your blood sugar at least 4 times a day, starting 2 days before surgery, to make sure that the level is not too high or low. o Check your blood sugar the morning of your surgery when you wake up and every 2 hours until you get to the Short Stay unit. . If your blood sugar is less than 70 mg/dL, you will need to treat for low blood sugar: o Do not take insulin. o Treat a low blood sugar (less than 70 mg/dL) with  cup of clear juice (cranberry or apple), 4 glucose tablets, OR glucose gel. o Recheck blood sugar in 15 minutes after treatment (to make sure it is greater than 70 mg/dL). If your blood sugar is not greater than 70 mg/dL  on recheck, call [phone] for further instructions. . Report your blood sugar to the short stay nurse when you get to Short Stay.  . If you are admitted to the hospital after surgery: o Your blood sugar will be checked by the staff and you will probably be given insulin after surgery (instead of oral diabetes medicines) to make sure you have good blood sugar levels. o The goal for blood sugar control after surgery is 80-180 mg/dL.     - Preparing For Surgery  Before surgery, you can play an important role. Because skin is not sterile, your skin needs to be as free of  germs as possible. You can reduce the number of germs on your skin by washing with CHG (chlorahexidine gluconate) Soap before surgery.  CHG is an antiseptic cleaner which kills germs and bonds with the skin to continue killing germs even after washing.  Please do not use if you have an allergy to CHG or antibacterial soaps. If your skin becomes reddened/irritated stop using the CHG.  Do not shave (including legs and underarms) for at least 48 hours prior to first CHG shower. It is OK to shave your face.  Please follow these instructions carefully.   1. Shower the NIGHT BEFORE SURGERY and the MORNING OF SURGERY with CHG.   2. If you chose to wash your hair, wash your hair first as usual with your normal shampoo.  3. After you shampoo, rinse your hair and body thoroughly to remove the shampoo.  4. Use CHG as you would any other liquid soap. You can apply CHG directly to the skin and wash gently with a scrungie or a clean washcloth.   5. Apply the CHG Soap to your body ONLY FROM THE NECK DOWN.  Do not use on open wounds or open sores. Avoid contact with your eyes, ears, mouth and genitals (private parts). Wash genitals (private parts) with your normal soap.  6. Wash thoroughly, paying special attention to the area where your surgery will be performed.  7. Thoroughly rinse your body with warm water from the neck down.  8. DO NOT shower/wash with your normal soap after using and rinsing off the CHG Soap.  9. Pat yourself dry with a CLEAN TOWEL.   10. Wear CLEAN PAJAMAS   11. Place CLEAN SHEETS on your bed the night of your first shower and DO NOT SLEEP WITH PETS.  Day of Surgery: Do not apply any deodorants/lotions. Please wear clean clothes to the hospital/surgery center.

## 2015-08-23 NOTE — Progress Notes (Addendum)
PCP - Dr. Juanita Craver Cardiologist - Dr. [name]  EKG - 06/27/15 CXR - 07/18/15  Echo - 2009 Stress test - 2014   Patient denies chest pain and shortness of breath at PAT appointment.  Patient states that after the endarterectomy, he became very anxious and was unable to sleep for several days.  He believes it was due to the anesthesia.  Patient requested that chart be reviewed by anesthesia.  Chart sent to Myra Gianotti for review.

## 2015-08-24 LAB — HEMOGLOBIN A1C
HEMOGLOBIN A1C: 8.6 % — AB (ref 4.8–5.6)
Mean Plasma Glucose: 200 mg/dL

## 2015-08-27 NOTE — Progress Notes (Signed)
Anesthesia Chart Review:  Pt is a 75 year old male scheduled for R femoral endarterectomy, patch angioplasty on 08/30/2015 with Harold Barban. MD.   PCP is  Juanita Craver, MD.   PMH includes:  HTN, PAD (B iliac artery PTA and stenting 2009), carotid artery stenosis (s/p R CEA 09/02/12), hyperlipidemia, DM, anemia, GERD. Former smoker. BMI 31. S/p L unicompartmental knee 01/08/15.   Pt wants his anesthesiologist to be aware that he was difficult to arouse after anesthesia for CEA and he was very anxious and could not sleep for several days after.  Medications include: ASA, lipitor, plavix, diltiazem, iron, glipizide, lisinopril-hctz, metformin, prilosec, sitagliptin, terazosin. Pt to stop plavix 5 days prior to surgery.   Preoperative labs reviewed.  HgbA1c 8.6, glucose 162.   Chest x-ray 07/18/15 reviewed.  1. No radiographic evidence of acute cardiopulmonary disease. 2. Aortic atherosclerosis.  EKG 06/27/15: NSR. LAD. RBBB.   Carotid duplex 06/19/15: Essentially stable 40-59% bilateral ICA stenosis, s/p right carotid endarterectomy with patch and right ICA stent placement.  Nuclear stress test 08/26/12: Normal stress nuclear study. LV Wall Motion:  NL LV Function; NL Wall Motion  If no changes, I anticipate pt can proceed with surgery as scheduled.   Willeen Cass, FNP-BC [facility]/Anesthesiology Phone: [phone] 08/27/2015 2:20 PM

## 2015-08-29 MED ORDER — DEXTROSE 5 % IV SOLN
1.5000 g | INTRAVENOUS | Status: AC
  Administered 2015-08-30: 1.5 g via INTRAVENOUS
  Filled 2015-08-29 (×2): qty 1.5

## 2015-08-29 MED ORDER — SODIUM CHLORIDE 0.9 % IV SOLN: INTRAVENOUS | Status: DC

## 2015-08-30 ENCOUNTER — Encounter (HOSPITAL_COMMUNITY): Payer: Self-pay | Admitting: Surgery

## 2015-08-30 ENCOUNTER — Inpatient Hospital Stay (HOSPITAL_COMMUNITY): Payer: Medicare Other | Admitting: Emergency Medicine

## 2015-08-30 ENCOUNTER — Inpatient Hospital Stay (HOSPITAL_COMMUNITY): Payer: Medicare Other | Admitting: Anesthesiology

## 2015-08-30 ENCOUNTER — Encounter (HOSPITAL_COMMUNITY)
Admission: RE | Disposition: A | Discharge status: Home / Self Care | Payer: Self-pay | Source: Ambulatory Visit | Attending: Surgery

## 2015-08-30 ENCOUNTER — Inpatient Hospital Stay (HOSPITAL_COMMUNITY)
Admission: RE | Admit: 2015-08-30 | Discharge: 2015-08-31 | DRG: 272 | Disposition: A | Discharge status: Home / Self Care | Payer: Medicare Other | Source: Ambulatory Visit | Attending: Surgery | Admitting: Surgery

## 2015-08-30 DIAGNOSIS — Z79899 Other long term (current) drug therapy: Secondary | ICD-10-CM

## 2015-08-30 DIAGNOSIS — I70211 Atherosclerosis of native arteries of extremities with intermittent claudication, right leg: Secondary | ICD-10-CM | POA: Diagnosis present

## 2015-08-30 DIAGNOSIS — Z7984 Long term (current) use of oral hypoglycemic drugs: Secondary | ICD-10-CM

## 2015-08-30 DIAGNOSIS — I10 Essential (primary) hypertension: Secondary | ICD-10-CM | POA: Diagnosis present

## 2015-08-30 DIAGNOSIS — E119 Type 2 diabetes mellitus without complications: Secondary | ICD-10-CM | POA: Diagnosis present

## 2015-08-30 DIAGNOSIS — Z7951 Long term (current) use of inhaled steroids: Secondary | ICD-10-CM | POA: Diagnosis not present

## 2015-08-30 DIAGNOSIS — I739 Peripheral vascular disease, unspecified: Secondary | ICD-10-CM | POA: Diagnosis present

## 2015-08-30 DIAGNOSIS — Z95820 Peripheral vascular angioplasty status with implants and grafts: Secondary | ICD-10-CM | POA: Diagnosis not present

## 2015-08-30 DIAGNOSIS — I708 Atherosclerosis of other arteries: Secondary | ICD-10-CM | POA: Diagnosis present

## 2015-08-30 DIAGNOSIS — E785 Hyperlipidemia, unspecified: Secondary | ICD-10-CM | POA: Diagnosis present

## 2015-08-30 DIAGNOSIS — Z7902 Long term (current) use of antithrombotics/antiplatelets: Secondary | ICD-10-CM | POA: Diagnosis not present

## 2015-08-30 DIAGNOSIS — Z87891 Personal history of nicotine dependence: Secondary | ICD-10-CM | POA: Diagnosis not present

## 2015-08-30 DIAGNOSIS — Z7982 Long term (current) use of aspirin: Secondary | ICD-10-CM | POA: Diagnosis not present

## 2015-08-30 HISTORY — PX: ENDARTERECTOMY FEMORAL: SHX5804

## 2015-08-30 HISTORY — PX: PATCH ANGIOPLASTY: SHX6230

## 2015-08-30 LAB — GLUCOSE, CAPILLARY
GLUCOSE-CAPILLARY: 195 mg/dL — AB (ref 65–99)
GLUCOSE-CAPILLARY: 180 mg/dL — AB (ref 65–99)
Glucose-Capillary: 177 mg/dL — ABNORMAL HIGH (ref 65–99)
Glucose-Capillary: 243 mg/dL — ABNORMAL HIGH (ref 65–99)

## 2015-08-30 LAB — CBC
HCT: 34 % — ABNORMAL LOW (ref 39.0–52.0)
Hemoglobin: 11 g/dL — ABNORMAL LOW (ref 13.0–17.0)
MCH: 28.6 pg (ref 26.0–34.0)
MCHC: 32.4 g/dL (ref 30.0–36.0)
MCV: 88.3 fL (ref 78.0–100.0)
PLATELETS: 142 10*3/uL — AB (ref 150–400)
RBC: 3.85 MIL/uL — ABNORMAL LOW (ref 4.22–5.81)
RDW: 14.3 % (ref 11.5–15.5)
WBC: 5.8 10*3/uL (ref 4.0–10.5)

## 2015-08-30 LAB — CREATININE, SERUM
CREATININE: 0.74 mg/dL (ref 0.61–1.24)
GFR calc Af Amer: >60 mL/min (ref >60)
GFR calc non Af Amer: >60 mL/min (ref >60)

## 2015-08-30 SURGERY — ENDARTERECTOMY, FEMORAL
Anesthesia: General | Site: Groin | Laterality: Right

## 2015-08-30 MED ORDER — DEXTROSE 5 % IV SOLN
1.5000 g | Freq: Two times a day (BID) | INTRAVENOUS | Status: DC
  Administered 2015-08-30: 1.5 g via INTRAVENOUS
  Filled 2015-08-30 (×2): qty 1.5

## 2015-08-30 MED ORDER — LABETALOL HCL 5 MG/ML IV SOLN
INTRAVENOUS | Status: AC
  Filled 2015-08-30: qty 4

## 2015-08-30 MED ORDER — HYDRALAZINE HCL 20 MG/ML IJ SOLN
5.0000 mg | INTRAMUSCULAR | Status: DC | PRN
  Filled 2015-08-30: qty 1

## 2015-08-30 MED ORDER — HEPARIN SODIUM (PORCINE) 1000 UNIT/ML IJ SOLN
INTRAMUSCULAR | Status: AC
Start: 2015-08-30 — End: 2015-08-30
  Filled 2015-08-30: qty 1

## 2015-08-30 MED ORDER — PROPOFOL 10 MG/ML IV BOLUS
INTRAVENOUS | Status: DC | PRN
  Administered 2015-08-30: 100 mg via INTRAVENOUS
  Administered 2015-08-30: 50 mg via INTRAVENOUS

## 2015-08-30 MED ORDER — MORPHINE SULFATE (PF) 2 MG/ML IV SOLN: 2.0000 mg | INTRAVENOUS | Status: DC | PRN

## 2015-08-30 MED ORDER — CHLORHEXIDINE GLUCONATE CLOTH 2 % EX PADS: 6.0000 | MEDICATED_PAD | Freq: Once | CUTANEOUS | Status: DC

## 2015-08-30 MED ORDER — HYDROCHLOROTHIAZIDE 12.5 MG PO CAPS
12.5000 mg | ORAL_CAPSULE | Freq: Every day | ORAL | Status: DC
  Administered 2015-08-30: 12.5 mg via ORAL
  Filled 2015-08-30: qty 1

## 2015-08-30 MED ORDER — LIDOCAINE 2% (20 MG/ML) 5 ML SYRINGE
INTRAMUSCULAR | Status: DC | PRN
  Administered 2015-08-30: 100 mg via INTRAVENOUS

## 2015-08-30 MED ORDER — ONDANSETRON HCL 4 MG/2ML IJ SOLN: 4.0000 mg | Freq: Four times a day (QID) | INTRAMUSCULAR | Status: DC | PRN

## 2015-08-30 MED ORDER — PANTOPRAZOLE SODIUM 40 MG PO TBEC: 40.0000 mg | DELAYED_RELEASE_TABLET | Freq: Every day | ORAL | Status: DC

## 2015-08-30 MED ORDER — HEMOSTATIC AGENTS (NO CHARGE) OPTIME
TOPICAL | Status: DC | PRN
  Administered 2015-08-30 (×2): 1 via TOPICAL

## 2015-08-30 MED ORDER — EPHEDRINE SULFATE-NACL 50-0.9 MG/10ML-% IV SOSY
PREFILLED_SYRINGE | INTRAVENOUS | Status: DC | PRN
  Administered 2015-08-30: 5 mg via INTRAVENOUS

## 2015-08-30 MED ORDER — FENTANYL CITRATE (PF) 100 MCG/2ML IJ SOLN
INTRAMUSCULAR | Status: AC
  Filled 2015-08-30: qty 2

## 2015-08-30 MED ORDER — ALUM & MAG HYDROXIDE-SIMETH 200-200-20 MG/5ML PO SUSP
15.0000 mL | ORAL | Status: DC | PRN
Start: 2015-08-30 — End: 2015-08-31

## 2015-08-30 MED ORDER — METOPROLOL TARTRATE 5 MG/5ML IV SOLN: 2.0000 mg | INTRAVENOUS | Status: DC | PRN

## 2015-08-30 MED ORDER — INSULIN ASPART 100 UNIT/ML ~~LOC~~ SOLN: 0.0000 [IU] | Freq: Three times a day (TID) | SUBCUTANEOUS | Status: DC

## 2015-08-30 MED ORDER — LABETALOL HCL 5 MG/ML IV SOLN
10.0000 mg | INTRAVENOUS | Status: DC | PRN
  Administered 2015-08-30 (×2): 10 mg via INTRAVENOUS

## 2015-08-30 MED ORDER — MAGNESIUM HYDROXIDE 400 MG/5ML PO SUSP
30.0000 mL | Freq: Every day | ORAL | Status: DC | PRN
  Filled 2015-08-30: qty 30

## 2015-08-30 MED ORDER — HEPARIN SODIUM (PORCINE) 1000 UNIT/ML IJ SOLN
INTRAMUSCULAR | Status: DC | PRN
  Administered 2015-08-30: 8000 [IU] via INTRAVENOUS
  Administered 2015-08-30: 1000 [IU] via INTRAVENOUS

## 2015-08-30 MED ORDER — HEPARIN SODIUM (PORCINE) 1000 UNIT/ML IJ SOLN
INTRAMUSCULAR | Status: AC
  Filled 2015-08-30: qty 1

## 2015-08-30 MED ORDER — OXYCODONE-ACETAMINOPHEN 5-325 MG PO TABS
1.0000 | ORAL_TABLET | ORAL | Status: DC | PRN
  Administered 2015-08-30: 2 via ORAL
  Filled 2015-08-30: qty 2

## 2015-08-30 MED ORDER — 0.9 % SODIUM CHLORIDE (POUR BTL) OPTIME
TOPICAL | Status: DC | PRN
  Administered 2015-08-30: 2000 mL

## 2015-08-30 MED ORDER — LISINOPRIL-HYDROCHLOROTHIAZIDE 20-12.5 MG PO TABS: 1.0000 | ORAL_TABLET | Freq: Every day | ORAL | Status: DC

## 2015-08-30 MED ORDER — CLOPIDOGREL BISULFATE 75 MG PO TABS: 75.0000 mg | ORAL_TABLET | Freq: Every day | ORAL | Status: DC

## 2015-08-30 MED ORDER — NEOSTIGMINE METHYLSULFATE 5 MG/5ML IV SOSY
PREFILLED_SYRINGE | INTRAVENOUS | Status: DC | PRN
  Administered 2015-08-30: 3 mg via INTRAVENOUS

## 2015-08-30 MED ORDER — FENTANYL CITRATE (PF) 250 MCG/5ML IJ SOLN
INTRAMUSCULAR | Status: AC
  Filled 2015-08-30: qty 5

## 2015-08-30 MED ORDER — PROTAMINE SULFATE 10 MG/ML IV SOLN
INTRAVENOUS | Status: DC | PRN
  Administered 2015-08-30: 45 mg via INTRAVENOUS
  Administered 2015-08-30: 5 mg via INTRAVENOUS

## 2015-08-30 MED ORDER — LACTATED RINGERS IV SOLN
INTRAVENOUS | Status: DC
  Administered 2015-08-30 (×2): via INTRAVENOUS

## 2015-08-30 MED ORDER — ROCURONIUM BROMIDE 10 MG/ML (PF) SYRINGE
PREFILLED_SYRINGE | INTRAVENOUS | Status: DC | PRN
  Administered 2015-08-30: 50 mg via INTRAVENOUS

## 2015-08-30 MED ORDER — SODIUM CHLORIDE 0.9 % IV SOLN
INTRAVENOUS | Status: DC | PRN
  Administered 2015-08-30: 10:00:00

## 2015-08-30 MED ORDER — POTASSIUM CHLORIDE CRYS ER 20 MEQ PO TBCR: 20.0000 meq | EXTENDED_RELEASE_TABLET | Freq: Every day | ORAL | Status: DC | PRN

## 2015-08-30 MED ORDER — GLYCOPYRROLATE 0.2 MG/ML IV SOSY
PREFILLED_SYRINGE | INTRAVENOUS | Status: DC | PRN
  Administered 2015-08-30: .4 mg via INTRAVENOUS

## 2015-08-30 MED ORDER — ONDANSETRON HCL 4 MG/2ML IJ SOLN: 4.0000 mg | Freq: Once | INTRAMUSCULAR | Status: DC | PRN

## 2015-08-30 MED ORDER — ATORVASTATIN CALCIUM 80 MG PO TABS
80.0000 mg | ORAL_TABLET | Freq: Every day | ORAL | Status: DC
  Administered 2015-08-30: 80 mg via ORAL
  Filled 2015-08-30: qty 1

## 2015-08-30 MED ORDER — PHENOL 1.4 % MT LIQD
1.0000 | OROMUCOSAL | Status: DC | PRN
  Filled 2015-08-30: qty 177

## 2015-08-30 MED ORDER — PHENOL 1.4 % MT LIQD: 1.0000 | OROMUCOSAL | Status: DC | PRN

## 2015-08-30 MED ORDER — PHENYLEPHRINE 40 MCG/ML (10ML) SYRINGE FOR IV PUSH (FOR BLOOD PRESSURE SUPPORT)
PREFILLED_SYRINGE | INTRAVENOUS | Status: DC | PRN
  Administered 2015-08-30: 80 ug via INTRAVENOUS
  Administered 2015-08-30: 40 ug via INTRAVENOUS
  Administered 2015-08-30: 80 ug via INTRAVENOUS
  Administered 2015-08-30: 120 ug via INTRAVENOUS

## 2015-08-30 MED ORDER — METFORMIN HCL 500 MG PO TABS
1000.0000 mg | ORAL_TABLET | Freq: Two times a day (BID) | ORAL | Status: DC
  Administered 2015-08-30: 1000 mg via ORAL
  Filled 2015-08-30 (×2): qty 2

## 2015-08-30 MED ORDER — PROPOFOL 10 MG/ML IV BOLUS
INTRAVENOUS | Status: AC
  Filled 2015-08-30: qty 40

## 2015-08-30 MED ORDER — GLIPIZIDE 5 MG PO TABS
10.0000 mg | ORAL_TABLET | Freq: Every day | ORAL | Status: DC
  Filled 2015-08-30: qty 2

## 2015-08-30 MED ORDER — LINAGLIPTIN 5 MG PO TABS: 5.0000 mg | ORAL_TABLET | Freq: Every day | ORAL | Status: DC

## 2015-08-30 MED ORDER — FENTANYL CITRATE (PF) 100 MCG/2ML IJ SOLN: 25.0000 ug | INTRAMUSCULAR | Status: DC | PRN

## 2015-08-30 MED ORDER — FERROUS SULFATE 325 (65 FE) MG PO TABS
325.0000 mg | ORAL_TABLET | Freq: Two times a day (BID) | ORAL | Status: DC
  Administered 2015-08-30: 325 mg via ORAL
  Filled 2015-08-30 (×2): qty 1

## 2015-08-30 MED ORDER — ACETAMINOPHEN 325 MG RE SUPP: 325.0000 mg | RECTAL | Status: DC | PRN

## 2015-08-30 MED ORDER — MIDAZOLAM HCL 2 MG/2ML IJ SOLN
INTRAMUSCULAR | Status: AC
  Filled 2015-08-30: qty 2

## 2015-08-30 MED ORDER — ACETAMINOPHEN 325 MG PO TABS: 325.0000 mg | ORAL_TABLET | ORAL | Status: DC | PRN

## 2015-08-30 MED ORDER — FENTANYL CITRATE (PF) 100 MCG/2ML IJ SOLN
INTRAMUSCULAR | Status: DC | PRN
  Administered 2015-08-30: 50 ug via INTRAVENOUS
  Administered 2015-08-30: 25 ug via INTRAVENOUS
  Administered 2015-08-30: 50 ug via INTRAVENOUS
  Administered 2015-08-30: 25 ug via INTRAVENOUS

## 2015-08-30 MED ORDER — LISINOPRIL 20 MG PO TABS
20.0000 mg | ORAL_TABLET | Freq: Every day | ORAL | Status: DC
  Administered 2015-08-30: 20 mg via ORAL
  Filled 2015-08-30: qty 1

## 2015-08-30 MED ORDER — GUAIFENESIN-DM 100-10 MG/5ML PO SYRP: 15.0000 mL | ORAL_SOLUTION | ORAL | Status: DC | PRN

## 2015-08-30 MED ORDER — BISACODYL 10 MG RE SUPP: 10.0000 mg | Freq: Every day | RECTAL | Status: DC | PRN

## 2015-08-30 MED ORDER — GLYCOPYRROLATE 0.2 MG/ML IV SOSY
PREFILLED_SYRINGE | INTRAVENOUS | Status: AC
  Filled 2015-08-30: qty 3

## 2015-08-30 MED ORDER — PHENYLEPHRINE HCL 10 MG/ML IJ SOLN
INTRAVENOUS | Status: DC | PRN
  Administered 2015-08-30: 25 ug/min via INTRAVENOUS

## 2015-08-30 MED ORDER — ENOXAPARIN SODIUM 30 MG/0.3ML ~~LOC~~ SOLN: 30.0000 mg | SUBCUTANEOUS | Status: DC

## 2015-08-30 MED ORDER — ASPIRIN EC 81 MG PO TBEC: 81.0000 mg | DELAYED_RELEASE_TABLET | Freq: Every day | ORAL | Status: DC

## 2015-08-30 MED ORDER — MIDAZOLAM HCL 5 MG/5ML IJ SOLN
INTRAMUSCULAR | Status: DC | PRN
  Administered 2015-08-30 (×2): 1 mg via INTRAVENOUS

## 2015-08-30 MED ORDER — SODIUM CHLORIDE 0.9 % IV SOLN: 500.0000 mL | Freq: Once | INTRAVENOUS | Status: DC | PRN

## 2015-08-30 MED ORDER — ONDANSETRON HCL 4 MG/2ML IJ SOLN
INTRAMUSCULAR | Status: DC | PRN
  Administered 2015-08-30: 4 mg via INTRAVENOUS

## 2015-08-30 MED ORDER — SODIUM CHLORIDE 0.9 % IV SOLN
INTRAVENOUS | Status: DC
  Administered 2015-08-30: 13:00:00 via INTRAVENOUS

## 2015-08-30 MED ORDER — DOCUSATE SODIUM 100 MG PO CAPS: 100.0000 mg | ORAL_CAPSULE | Freq: Every day | ORAL | Status: DC

## 2015-08-30 MED ORDER — MAGNESIUM SULFATE 2 GM/50ML IV SOLN: 2.0000 g | Freq: Every day | INTRAVENOUS | Status: DC | PRN

## 2015-08-30 MED ORDER — NEOSTIGMINE METHYLSULFATE 5 MG/5ML IV SOSY
PREFILLED_SYRINGE | INTRAVENOUS | Status: AC
  Filled 2015-08-30: qty 5

## 2015-08-30 MED ORDER — FLUTICASONE PROPIONATE 50 MCG/ACT NA SUSP
1.0000 | Freq: Every evening | NASAL | Status: DC | PRN
  Filled 2015-08-30: qty 16

## 2015-08-30 MED ORDER — FENTANYL CITRATE (PF) 100 MCG/2ML IJ SOLN
25.0000 ug | INTRAMUSCULAR | Status: DC | PRN
  Administered 2015-08-30: 25 ug via INTRAVENOUS

## 2015-08-30 MED ORDER — TERAZOSIN HCL 5 MG PO CAPS
10.0000 mg | ORAL_CAPSULE | Freq: Every day | ORAL | Status: DC
  Administered 2015-08-30: 10 mg via ORAL
  Filled 2015-08-30: qty 2

## 2015-08-30 MED ORDER — CHLORHEXIDINE GLUCONATE CLOTH 2 % EX PADS
6.0000 | MEDICATED_PAD | Freq: Once | CUTANEOUS | Status: DC
Start: 2015-08-30 — End: 2015-08-30

## 2015-08-30 MED ORDER — DILTIAZEM HCL ER COATED BEADS 120 MG PO CP24
240.0000 mg | ORAL_CAPSULE | Freq: Every day | ORAL | Status: DC
  Filled 2015-08-30: qty 1

## 2015-08-30 SURGICAL SUPPLY — 52 items
BANDAGE ELASTIC 4 VELCRO ST LF (GAUZE/BANDAGES/DRESSINGS) IMPLANT
CANISTER SUCTION 2500CC (MISCELLANEOUS) ×2 IMPLANT
CATH EMB 4FR 80CM (CATHETERS) ×2 IMPLANT
CLIP TI MEDIUM 24 (CLIP) ×2 IMPLANT
CLIP TI WIDE RED SMALL 24 (CLIP) ×2 IMPLANT
DRAIN CHANNEL 15F RND FF W/TCR (WOUND CARE) IMPLANT
DRAPE X-RAY CASS 24X20 (DRAPES) IMPLANT
DRSG COVADERM 4X10 (GAUZE/BANDAGES/DRESSINGS) IMPLANT
DRSG COVADERM 4X8 (GAUZE/BANDAGES/DRESSINGS) IMPLANT
ELECT CAUTERY BLADE 6.4 (BLADE) ×2 IMPLANT
ELECT REM PT RETURN 9FT ADLT (ELECTROSURGICAL) ×2
ELECTRODE REM PT RTRN 9FT ADLT (ELECTROSURGICAL) ×1 IMPLANT
EVACUATOR SILICONE 100CC (DRAIN) IMPLANT
GLOVE BIO SURGEON STRL SZ 6.5 (GLOVE) ×6 IMPLANT
GLOVE BIOGEL PI IND STRL 6.5 (GLOVE) ×1 IMPLANT
GLOVE BIOGEL PI IND STRL 7.0 (GLOVE) ×1 IMPLANT
GLOVE BIOGEL PI IND STRL 7.5 (GLOVE) ×1 IMPLANT
GLOVE BIOGEL PI INDICATOR 6.5 (GLOVE) ×1
GLOVE BIOGEL PI INDICATOR 7.0 (GLOVE) ×1
GLOVE BIOGEL PI INDICATOR 7.5 (GLOVE) ×1
GLOVE ECLIPSE 6.5 STRL STRAW (GLOVE) ×2 IMPLANT
GLOVE SURG SS PI 7.5 STRL IVOR (GLOVE) ×2 IMPLANT
GOWN STRL REUS W/ TWL LRG LVL3 (GOWN DISPOSABLE) ×3 IMPLANT
GOWN STRL REUS W/ TWL XL LVL3 (GOWN DISPOSABLE) ×1 IMPLANT
GOWN STRL REUS W/TWL LRG LVL3 (GOWN DISPOSABLE) ×3
GOWN STRL REUS W/TWL XL LVL3 (GOWN DISPOSABLE) ×1
HEMOSTAT SNOW SURGICEL 2X4 (HEMOSTASIS) IMPLANT
KIT BASIN OR (CUSTOM PROCEDURE TRAY) ×2 IMPLANT
KIT ROOM TURNOVER OR (KITS) ×2 IMPLANT
LIQUID BAND (GAUZE/BANDAGES/DRESSINGS) ×2 IMPLANT
NS IRRIG 1000ML POUR BTL (IV SOLUTION) ×4 IMPLANT
PACK PERIPHERAL VASCULAR (CUSTOM PROCEDURE TRAY) ×2 IMPLANT
PAD ARMBOARD 7.5X6 YLW CONV (MISCELLANEOUS) ×4 IMPLANT
PATCH VASC XENOSURE 1CMX6CM (Vascular Products) ×1 IMPLANT
PATCH VASC XENOSURE 1X6 (Vascular Products) ×1 IMPLANT
SET COLLECT BLD 21X3/4 12 (NEEDLE) IMPLANT
SPONGE INTESTINAL PEANUT (DISPOSABLE) ×2 IMPLANT
STOPCOCK 4 WAY LG BORE MALE ST (IV SETS) ×2 IMPLANT
SUT ETHILON 3 0 PS 1 (SUTURE) IMPLANT
SUT PROLENE 5 0 C 1 24 (SUTURE) ×6 IMPLANT
SUT PROLENE 6 0 BV (SUTURE) ×2 IMPLANT
SUT PROLENE 7 0 BV1 MDA (SUTURE) ×2 IMPLANT
SUT VIC AB 2-0 CT1 27 (SUTURE) ×1
SUT VIC AB 2-0 CT1 TAPERPNT 27 (SUTURE) ×1 IMPLANT
SUT VIC AB 3-0 SH 27 (SUTURE) ×1
SUT VIC AB 3-0 SH 27X BRD (SUTURE) ×1 IMPLANT
SUT VICRYL 4-0 PS2 18IN ABS (SUTURE) ×2 IMPLANT
SYR 3ML LL SCALE MARK (SYRINGE) ×2 IMPLANT
TRAY FOLEY W/METER SILVER 16FR (SET/KITS/TRAYS/PACK) IMPLANT
TUBING EXTENTION W/L.L. (IV SETS) IMPLANT
UNDERPAD 30X30 INCONTINENT (UNDERPADS AND DIAPERS) IMPLANT
WATER STERILE IRR 1000ML POUR (IV SOLUTION) ×2 IMPLANT

## 2015-08-30 NOTE — Anesthesia Procedure Notes (Signed)
Procedure Name: Intubation Date/Time: 08/30/2015 10:45 AM Performed by: Merdis Delay Pre-anesthesia Checklist: Patient identified, Emergency Drugs available, Suction available, Patient being monitored and Timeout performed Patient Re-evaluated:Patient Re-evaluated prior to inductionOxygen Delivery Method: Circle system utilized Preoxygenation: Pre-oxygenation with 100% oxygen Intubation Type: IV induction Laryngoscope Size: Mac and 4 Grade View: Grade III Tube type: Oral Tube size: 7.5 mm Number of attempts: 1 Airway Equipment and Method: Stylet Placement Confirmation: ETT inserted through vocal cords under direct vision,  positive ETCO2,  CO2 detector and breath sounds checked- equal and bilateral Secured at: 22 cm Tube secured with: Tape Dental Injury: Teeth and Oropharynx as per pre-operative assessment  Comments: EPIGLOTTIS VIEW ONLY. LARGE EPIGLOTTIS.

## 2015-08-30 NOTE — Anesthesia Postprocedure Evaluation (Signed)
Anesthesia Post Note  Patient: William Burke  Procedure(s) Performed: Procedure(s) (LRB): RIGHT FEMORAL ENDARTERECTOMY (Right) PATCH ANGIOPLASTY (Right)  Patient location during evaluation: PACU Anesthesia Type: General Level of consciousness: awake and alert Pain management: pain level controlled Vital Signs Assessment: post-procedure vital signs reviewed and stable Respiratory status: spontaneous breathing, nonlabored ventilation, respiratory function stable and patient connected to nasal cannula oxygen Cardiovascular status: blood pressure returned to baseline and stable Postop Assessment: no signs of nausea or vomiting Anesthetic complications: no    Last Vitals:  Vitals:   08/30/15 1530 08/30/15 1600  BP: (!) 164/68 (!) 163/68  Pulse: 67 60  Resp: 20 20  Temp:      Last Pain:  Vitals:   08/30/15 1600  TempSrc:   PainSc: 0-No pain                 Nilda Simmer

## 2015-08-30 NOTE — Interval H&P Note (Signed)
History and Physical Interval Note:  08/30/2015 9:56 AM  William Burke  has presented today for surgery, with the diagnosis of Right femoral thrombus I82.41  The various methods of treatment have been discussed with the patient and family. After consideration of risks, benefits and other options for treatment, the patient has consented to  Procedure(s): ENDARTERECTOMY FEMORAL (Right) PATCH ANGIOPLASTY (Right) as a surgical intervention .  The patient's history has been reviewed, patient examined, no change in status, stable for surgery.  I have reviewed the patient's chart and labs.  Questions were answered to the patient's satisfaction.     Annamarie Major

## 2015-08-30 NOTE — H&P (View-Only) (Signed)
[facility] [place]  Patient name: William Burke MRN: [id number] DOB: 1940/09/25 Sex: male  REASON FOR VISIT: right femoral clot  HPI: William Burke is a 75 y.o. male who is well known to me, having undergone right carotid endarterectomy and patch angioplasty right carotid stent for recurrent stenosis in 2014 and 15.  He has a history of peripheral vascular disease.  In June 2017 he underwent angiography by Dr. Gery Pray.  He was having claudication.  He has a history of bilateral iliac stents.  He was found to have a high-grade 90% stenosis within the right common iliac artery which was treated.  Patient has had resolution of his symptoms.  A filling defect was visualized within the right common femoral and superficial femoral artery.  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  Patient has history of diabetes type 2.  He suffers from hypercholesterolemia which is managed with a statin.  He is on ACE inhibitor for hypertension.  He is a former smoker.  He is on dual antiplatelet therapy.    Past Medical History  Diagnosis Date  . PAD (peripheral artery disease) (HCC) 06/22/2007    Bilateral iliac artery PTA and stenting with iCAST. Last lower extremity arterial Dopplers 02/25/2011 right ABI 1.0 left ABI 1.1.  Bilateral carotid artery stenosis. Last carotid Dopplers 07/18/2011: Max ICA stenosis 50-69% bilaterally  . Diabetes mellitus, type 2 (HCC)   . Hypertension   . Hyperlipidemia   . History of tobacco abuse   . Carotid artery disease (HCC)     status post right carotid artery stenting 05/24/13  . UTI (lower urinary tract infection)   . GERD (gastroesophageal reflux disease)     occ  . Complication of anesthesia     sensitive to anesthesia ,longer to awaken-"doesn't require much"  . Arthritis     osteoarthritis knees, elbow right    Family History  Problem Relation Age of Onset  . Diabetes Mother   . Hypertension Mother   .  Cerebral aneurysm Mother   . Multiple sclerosis Father   . Kidney disease Father     SOCIAL HISTORY: Social History  Substance Use Topics  . Smoking status: Former Smoker -- 3.00 packs/day for 25 years    Types: Cigarettes    Quit date: 08/06/1987  . Smokeless tobacco: Never Used  . Alcohol Use: No    No Known Allergies  Current Outpatient Prescriptions  Medication Sig Dispense Refill  . aspirin EC 81 MG tablet Take 81 mg by mouth daily.    [name] atorvastatin (LIPITOR) 80 MG tablet Take 1 tablet (80 mg total) by mouth daily at 6 PM. 30 tablet 3  . B Complex-C (SUPER B COMPLEX PO) Take 1 tablet by mouth daily.    . cholecalciferol (VITAMIN D) 1000 UNITS tablet Take 1,000 Units by mouth daily.    . Chromium Picolinate 500 MCG CAPS Take 500 mg by mouth daily.     . clopidogrel (PLAVIX) 75 MG tablet Take 1 tablet (75 mg total) by mouth daily. 30 tablet 3  . diltiazem (TIAZAC) 240 MG 24 hr capsule Take 240 mg by mouth daily.     . ferrous sulfate 325 (65 FE) MG tablet Take 325 mg by mouth 2 (two) times daily with a meal.    . fish oil-omega-3 fatty acids 1000 MG capsule Take 1 g by mouth daily.    . Flaxseed, Linseed, (FLAXSEED OIL) 1000 MG CAPS Take 1,000 mg by mouth daily.     [name]  fluticasone (FLONASE) 50 MCG/ACT nasal spray Place 1 spray into both nostrils at bedtime.     . Garlic Oil 1000 MG CAPS Take 1,000 mg by mouth daily.    [name] glipiZIDE (GLUCOTROL) 10 MG tablet Take 1 tablet by mouth daily before breakfast.     . lisinopril-hydrochlorothiazide (PRINZIDE,ZESTORETIC) 20-12.5 MG tablet Take 1 tablet by mouth daily.  1  . Magnesium Oxide 500 MG TABS Take 500 mg by mouth daily.    . metFORMIN (GLUCOPHAGE) 1000 MG tablet Take 1,000 mg by mouth 2 (two) times daily with a meal.    . Misc Natural Products (OSTEO BI-FLEX ADV JOINT SHIELD PO) Take 1 tablet by mouth daily.    . niacin 500 MG tablet Take 500 mg by mouth daily with breakfast.    . omeprazole (PRILOSEC) 20 MG capsule Take 20 mg by  mouth 2 (two) times daily.  1  . POTASSIUM GLUCONATE PO Take 99 mg by mouth daily.    . saw palmetto 160 MG capsule Take 160 mg by mouth daily.    . sitaGLIPtin (JANUVIA) 100 MG tablet Take 50 mg by mouth daily.    [name] terazosin (HYTRIN) 10 MG capsule Take 10 mg by mouth at bedtime.    . vitamin C (ASCORBIC ACID) 500 MG tablet Take 500 mg by mouth daily.    [name] zinc gluconate 50 MG tablet Take 50 mg by mouth daily.    [name] CIALIS 5 MG tablet Reported on 08/13/2015  11   No current facility-administered medications for this visit.    REVIEW OF SYSTEMS:  [X]  denotes positive finding, [ ]  denotes negative finding Cardiac  Comments:  Chest pain or chest pressure:    Shortness of breath upon exertion:    Short of breath when lying flat:    Irregular heart rhythm:        Vascular    Pain in calf, thigh, or hip brought on by ambulation: x hip  Pain in feet at night that wakes you up from your sleep:     Blood clot in your veins: x   Leg swelling:         Pulmonary    Oxygen at home:    Productive cough:  x   Wheezing:         Neurologic    Sudden weakness in arms or legs:     Sudden numbness in arms or legs:     Sudden onset of difficulty speaking or slurred speech:    Temporary loss of vision in one eye:     Problems with dizziness:         Gastrointestinal    Blood in stool:     Vomited blood:         Genitourinary    Burning when urinating:     Blood in urine:        Psychiatric    Major depression:         Hematologic    Bleeding problems:    Problems with blood clotting too easily:        Skin    Rashes or ulcers:        Constitutional    Fever or chills:      PHYSICAL EXAM: Filed Vitals:   08/13/15 1029 08/13/15 1032  BP: 185/81 165/83  Pulse: 79   Temp: 97.7 F (36.5 C)   TempSrc: Oral   Resp: 16   Height: 5\' 5"  (1.651 m)   Weight:  188 lb (85.276 kg)   SpO2: 96%     GENERAL: The patient is a well-nourished male, in no acute distress. The vital signs  are documented above. CARDIAC: There is a regular rate and rhythm.  VASCULAR: palpable femoral pulse PULMONARY: There is good air exchange bilaterally without wheezing or rales. ABDOMEN: Diastases in the upper abdomen MUSCULOSKELETAL: There are no major deformities or cyanosis. NEUROLOGIC: No focal weakness or paresthesias are detected. SKIN: There are no ulcers or rashes noted. PSYCHIATRIC: The patient has a normal affect.  DATA:  I have reviewed the patient's arteriogram compared to the study done at 2009.  The filling defect in the right femoral artery was not present in 2009.  This does not appear to be mobile.  I suspect this is at least subacute, possibly chronic  MEDICAL ISSUES: Right femoral thrombus: The patient is not symptomatic from his claudication.  He did have excellent symptomatic relief from his most recent intervention.  On the imaging studies, a filling defect was seen in the right groin.  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  I do not think there is any way to be absolutely sure.  I discussed our treatment options with the patient and his wife.  I believe there is concern for propagation of this process and potential embolization.  For that reason I have recommended proceeding with right femoral endarterectomy with patch angioplasty.  I will stop his Plavix 5 days before the operation.  We discussed the risks and benefits of the procedure including but not limited to the risk of wound complications, bleeding, and recurrence.  I will evaluate the lesion location with ultrasound in the operating room to determine whether or not to make an oblique or longitudinal incision.  I suspect a longitudinal incision will be required.  His operation has been scheduled for Wednesday, August 2    Durene Cal, MD [facility] [phone] Pager [phone]

## 2015-08-30 NOTE — Anesthesia Preprocedure Evaluation (Addendum)
Anesthesia Evaluation  Patient identified by MRN, date of birth, ID band Patient awake    Reviewed: Allergy & Precautions, NPO status , Patient's Chart, lab work & pertinent test results  History of Anesthesia Complications (+) history of anesthetic complications ("can't sleep after anesthesia wears off and i become very anxious" )  Airway Mallampati: II  TM Distance: >3 FB Neck ROM: Full    Dental  (+) Teeth Intact, Dental Advisory Given, Partial Upper,    Pulmonary former smoker,    Pulmonary exam normal breath sounds clear to auscultation       Cardiovascular hypertension, Pt. on medications + Peripheral Vascular Disease (s/p Right Carotid stent)  Normal cardiovascular exam Rhythm:Regular Rate:Normal     Neuro/Psych negative neurological ROS  negative psych ROS   GI/Hepatic Neg liver ROS, GERD  Medicated,  Endo/Other  diabetes, Type 2, Oral Hypoglycemic AgentsObesity   Renal/GU negative Renal ROS     Musculoskeletal  (+) Arthritis , Osteoarthritis,    Abdominal   Peds  Hematology  (+) Blood dyscrasia (Plavix; Plt 135k), anemia ,   Anesthesia Other Findings Day of surgery medications reviewed with the patient.  Reproductive/Obstetrics                            Anesthesia Physical Anesthesia Plan  ASA: IV  Anesthesia Plan: General   Post-op Pain Management:    Induction: Intravenous  Airway Management Planned: Oral ETT  Additional Equipment:   Intra-op Plan:   Post-operative Plan: Extubation in OR  Informed Consent: I have reviewed the patients History and Physical, chart, labs and discussed the procedure including the risks, benefits and alternatives for the proposed anesthesia with the patient or authorized representative who has indicated his/her understanding and acceptance.   Dental advisory given  Plan Discussed with: CRNA  Anesthesia Plan Comments:  (Risks/benefits of general anesthesia discussed with patient including risk of damage to teeth, lips, gum, and tongue, nausea/vomiting, allergic reactions to medications, and the possibility of heart attack, stroke and death.  All patient questions answered.  Patient wishes to proceed.)       Anesthesia Quick Evaluation

## 2015-08-30 NOTE — Progress Notes (Signed)
Pt with red marks on abdomen and thigh. Pt states it feels like it is "burning" and the marks are from the electric razor. Pt given cool cloth to place over area for relief.

## 2015-08-30 NOTE — Transfer of Care (Signed)
Immediate Anesthesia Transfer of Care Note  Patient: William Burke  Procedure(s) Performed: Procedure(s): RIGHT FEMORAL ENDARTERECTOMY (Right) PATCH ANGIOPLASTY (Right)  Patient Location: PACU  Anesthesia Type:General  Level of Consciousness: awake, alert  and oriented  Airway & Oxygen Therapy: Patient Spontanous Breathing and Patient connected to face mask oxygen  Post-op Assessment: Report given to RN and Post -op Vital signs reviewed and stable  Post vital signs: Reviewed and stable  Last Vitals:  Vitals:   08/30/15 0835  BP: (!) 151/63  Pulse: 65  Resp: 18  Temp: 37.1 C    Last Pain:  Vitals:   08/30/15 0835  TempSrc: Oral         Complications: No apparent anesthesia complications

## 2015-08-30 NOTE — Interval H&P Note (Signed)
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 After consideration of risks, benefits and other options for treatment, the patient has consented to  Procedure(s): ENDARTERECTOMY FEMORAL (Right) PATCH ANGIOPLASTY (Right) as a surgical intervention .  The patient's history has been reviewed, patient examined, no change in status, stable for surgery.  I have reviewed the patient's chart and labs.  Questions were answered to the patient's satisfaction.     Annamarie Major

## 2015-08-31 ENCOUNTER — Telehealth: Payer: Self-pay | Admitting: Surgery

## 2015-08-31 ENCOUNTER — Encounter (HOSPITAL_COMMUNITY): Payer: Self-pay | Admitting: Surgery

## 2015-08-31 LAB — GLUCOSE, CAPILLARY: Glucose-Capillary: 217 mg/dL — ABNORMAL HIGH (ref 65–99)

## 2015-08-31 LAB — BASIC METABOLIC PANEL
ANION GAP: 7 (ref 5–15)
BUN: 11 mg/dL (ref 6–20)
CALCIUM: 9.2 mg/dL (ref 8.9–10.3)
CO2: 30 mmol/L (ref 22–32)
CREATININE: 0.84 mg/dL (ref 0.61–1.24)
Chloride: 97 mmol/L — ABNORMAL LOW (ref 101–111)
GLUCOSE: 165 mg/dL — AB (ref 65–99)
Potassium: 3.9 mmol/L (ref 3.5–5.1)
Sodium: 134 mmol/L — ABNORMAL LOW (ref 135–145)

## 2015-08-31 LAB — CBC
HCT: 34.5 % — ABNORMAL LOW (ref 39.0–52.0)
Hemoglobin: 11.1 g/dL — ABNORMAL LOW (ref 13.0–17.0)
MCH: 28.5 pg (ref 26.0–34.0)
MCHC: 32.2 g/dL (ref 30.0–36.0)
MCV: 88.5 fL (ref 78.0–100.0)
PLATELETS: 140 10*3/uL — AB (ref 150–400)
RBC: 3.9 MIL/uL — ABNORMAL LOW (ref 4.22–5.81)
RDW: 14.3 % (ref 11.5–15.5)
WBC: 6.8 10*3/uL (ref 4.0–10.5)

## 2015-08-31 MED ORDER — OXYCODONE-ACETAMINOPHEN 5-325 MG PO TABS
1.0000 | ORAL_TABLET | Freq: Four times a day (QID) | ORAL | 0 refills | Status: DC | PRN
Start: 2015-08-31 — End: 2017-04-01

## 2015-08-31 NOTE — Telephone Encounter (Signed)
-----   Message from Mena Goes, RN sent at 08/30/2015  2:58 PM EDT ----- Regarding: schedule 2 weeks postop   ----- Message ----- From: Gabriel Earing, PA-C Sent: 08/30/2015  12:22 PM To: Vvs Charge Pool  S/p right femoral endarterectomy 08/30/15.  F/u with Dr. Trula Slade in 2 weeks.

## 2015-08-31 NOTE — Progress Notes (Signed)
Pt discharged home per MD order, all discharge instructions reviewed and questions answered.

## 2015-08-31 NOTE — Progress Notes (Signed)
OT NOTE  Pt discharged prior to OT evaluation.    Jeri Modena   OTR/L Pager: [phone] Office: [phone] .

## 2015-08-31 NOTE — Telephone Encounter (Signed)
Sched appt 8/14 at 4:00. Spoke to pt's wife to inform them of appt.

## 2015-08-31 NOTE — Op Note (Signed)
Patient name: William Burke MRN: [id number] DOB: [birth year] Sex: male  08/30/2015 Pre-operative Diagnosis: Right leg claudication Post-operative diagnosis:  Same Surgeon:  Durene Cal Assistants:  Lianne Cure Procedure:   Right distal external iliac, common femoral, profundofemoral, and superficial femoral artery endarterectomy with bovine pericardial patch angioplasty Anesthesia:  Gen. Blood Loss:  See anesthesia record Specimens:  None  Findings:  Chronic appearing plaque.  No acute thrombus identified.  The lesion was creating a significant stenosis within the right groin  Indications:  The patient has undergone percutaneous intervention by Dr. Allyson Sabal.  On angiography the patient had what appeared to be a possible acute thrombus in the common femoral artery.  He comes in today for endarterectomy as this lesion is creating a significant stenosis  Procedure:  The patient was identified in the holding area and taken to [facility] OR ROOM 11  The patient was then placed supine on the table. general anesthesia was administered.  The patient was prepped and draped in the usual sterile fashion.  A time out was called and antibiotics were administered.  Ultrasound was used to evaluate the common femoral artery and locate the bifurcation.  A longitudinal incision was made in the right groin.  Cautery was used to divide the subcutaneous tissue down to the femoral sheath which was opened sharply.  The common femoral profunda femoris superficial femoral artery were all dissected out sharply.  There was very calcified plaque.  Vessels were isolated with Vesseloops.  The patient was fully heparinized.  A prep circulated the distal external iliac,, common femoral profunda femoral and superficial femoral artery were all occluded as were the side branches.  A #11 blade was used to make an arteriotomy which was extended longitudinally with Potts scissors.  A Madelaine Etienne elevator was used to perform  endarterectomy.  A good distal endpoint in the superficial femoral artery was obtained and the endpoint was tacked down with 7-0 Prolene suture.  Similarly, a good endpoint in the proximal profunda femoral artery was identified and this was also tacked down with 7-0 Prolene.  I then inflated a #4 Fogarty catheter in the external iliac artery so I could visualize the distal external iliac artery and the plaque was removed with hemostat.  There was excellent inflow.  The endarterectomized bed was copiously irrigated.  All potential embolic debris was removed.  A bovine pericardial patch was selected and patch angioplasty was performed with running 5-0 Prolene.  Prior to completion, the appropriate flushing maneuvers were performed the anastomosis was completed.  There were excellent Doppler signals in all interrogated vessels.  50 mg of protamine was given.  Once hemostasis was satisfactory the femoral sheath was reapproximated with 2-0 Vicryl.  The subcutaneous tissue was then closed in multiple layers of 3-0 Vicryl followed by 4-0 Vicryl on the skin.  Dermabond was applied.  There were no immediate complications  Disposition:  To PACU in stable condition.   Juleen China, M.D. [facility] [place] Office: [phone] Pager:  [phone]

## 2015-08-31 NOTE — Progress Notes (Addendum)
[facility] [place]  Subjective  - He is hungry. He is ambulating, voided and ready to go home.   Objective (!) 117/58 70 98 F (36.7 C) (Oral) 20 98%  Intake/Output Summary (Last 24 hours) at 08/31/15 0715 Last data filed at 08/31/15 0000  Gross per 24 hour  Intake             2755 ml  Output              850 ml  Net             1905 ml    Palpable DP 2+ pulse right LE Groin soft without hematoma   Assessment/Planning: POD # 1 right femoral endarterectomy  D/C home, f/u with Sarahelizabeth Conway in 2 weeks  Laurence Slate [facility] 08/31/2015 7:15 AM --  Laboratory Lab Results:  Recent Labs  08/30/15 1439 08/31/15 0446  WBC 5.8 6.8  HGB 11.0* 11.1*  HCT 34.0* 34.5*  PLT 142* 140*   BMET  Recent Labs  08/30/15 1439 08/31/15 0446  NA  --  134*  K  --  3.9  CL  --  97*  CO2  --  30  GLUCOSE  --  165*  BUN  --  11  CREATININE 0.74 0.84  CALCIUM  --  9.2    COAG Lab Results  Component Value Date   INR 1.13 08/23/2015   INR 1.0 07/18/2015   INR 1.05 01/01/2015   No results found for: PTT   Looks great ready for discharge  WB

## 2015-09-01 NOTE — Care Management Note (Signed)
Case Management Note  Patient Details  Name: William Burke MRN: [id number] Date of Birth: 26-Sep-1940  Subjective/Objective:    Patient from home, s/p common femoral, profundofemoral, and superficial femoral artery endarterectomy , patient discharged, no needs.                Action/Plan:   Expected Discharge Date:                  Expected Discharge Plan:  Home/Self Care  In-House Referral:     Discharge planning Services  CM Consult  Post Acute Care Choice:    Choice offered to:     DME Arranged:    DME Agency:     HH Arranged:    HH Agency:     Status of Service:  Completed, signed off  If discussed at [employer] of Stay Meetings, dates discussed:    Additional Comments:  Zenon Mayo, RN 09/01/2015, 11:21 AM

## 2015-09-06 ENCOUNTER — Encounter: Payer: Self-pay | Admitting: Surgery

## 2015-09-10 ENCOUNTER — Other Ambulatory Visit: Payer: Self-pay | Admitting: Physician Assistant

## 2015-09-10 ENCOUNTER — Ambulatory Visit (INDEPENDENT_AMBULATORY_CARE_PROVIDER_SITE_OTHER): Payer: Medicare Other | Admitting: Surgery

## 2015-09-10 ENCOUNTER — Encounter: Payer: Self-pay | Admitting: Surgery

## 2015-09-10 VITALS — BP 176/83 | HR 71 | Temp 97.6°F | Ht 65.0 in | Wt 189.0 lb

## 2015-09-10 DIAGNOSIS — I739 Peripheral vascular disease, unspecified: Secondary | ICD-10-CM

## 2015-09-10 MED ORDER — CEPHALEXIN 500 MG PO CAPS: 500.0000 mg | ORAL_CAPSULE | Freq: Three times a day (TID) | ORAL | 0 refills | Status: DC

## 2015-09-10 NOTE — Progress Notes (Signed)
POST OPERATIVE OFFICE NOTE    CC:  F/u for surgery  HPI:  [name] who is s/p right distal external iliac, common femoral, profundofemoral and superficial femoral artery endarterectomy with bovine patch angioplasty on 08/30/15.  He returns today for his f/u visit.  He states that he has done well with the exception of having some pain and fullness around his incision.  He states it hurts to sit for prolonged periods of time and the seat belt over his incision is painful.   He denies any fevers.  He is walking without difficulty.   He denies any pain in his foot or leg.  He is on plavix, aspirin and a statin.    No Known Allergies  Current Outpatient Prescriptions  Medication Sig Dispense Refill  . aspirin EC 81 MG tablet Take 81 mg by mouth daily.    [name] atorvastatin (LIPITOR) 80 MG tablet Take 1 tablet (80 mg total) by mouth daily at 6 PM. 30 tablet 3  . clopidogrel (PLAVIX) 75 MG tablet Take 1 tablet (75 mg total) by mouth daily. 30 tablet 3  . diltiazem (TIAZAC) 240 MG 24 hr capsule Take 240 mg by mouth daily.     . ferrous sulfate 325 (65 FE) MG tablet Take 325 mg by mouth 2 (two) times daily with a meal.    . fluticasone (FLONASE) 50 MCG/ACT nasal spray Place 1 spray into both nostrils at bedtime as needed.     [name] glipiZIDE (GLUCOTROL) 10 MG tablet Take 1 tablet by mouth daily before breakfast.     . lisinopril-hydrochlorothiazide (PRINZIDE,ZESTORETIC) 20-12.5 MG tablet Take 1 tablet by mouth daily.  1  . metFORMIN (GLUCOPHAGE) 1000 MG tablet Take 1,000 mg by mouth 2 (two) times daily with a meal.    . omeprazole (PRILOSEC) 20 MG capsule Take 20 mg by mouth 2 (two) times daily.  1  . sitaGLIPtin (JANUVIA) 100 MG tablet Take 50 mg by mouth daily.    [name] terazosin (HYTRIN) 10 MG capsule Take 10 mg by mouth at bedtime.    . cephALEXin (KEFLEX) 500 MG capsule Take 1 capsule (500 mg total) by mouth 3 (three) times daily. 30 capsule 0  . oxyCODONE-acetaminophen (PERCOCET/ROXICET)  5-325 MG tablet Take 1 tablet by mouth every 6 (six) hours as needed for moderate pain. (Patient not taking: Reported on 09/10/2015) 10 tablet 0   No current facility-administered medications for this visit.      ROS:  See HPI  Physical Exam:  Vitals:   09/10/15 1604 09/10/15 1605  BP: (!) 174/82 (!) 176/83  Pulse: 71   Temp: 97.6 F (36.4 C)     Incision:  There is some redness around the proximal portion of the incision with fullness.  The distal portion of the incision is healing nicely.   Extremities:  Brisk doppler signals right DP/PT; right foot is warm.  There is a small dark area on the tip of the right great toe (pt states this is improving)   Assessment/Plan:  [name] who is s/p: ight distal external iliac, common femoral, profundofemoral and superficial femoral artery endarterectomy with bovine patch angioplasty on 08/30/15 by William Burke  -William Burke probed the proximal portion of the incision today and a seroma was drained.  This fluid did not appear to be infected.  -will  place pt on Keflex 500mg  tid x 10 days (sent electronically to [facility]) -he will return to see  William Burke in 3 weeks and return sooner if he has any issues.  -continue to monitor dark area on right great toe-he knows to let us know if this worsens before his next visit.    William Locket, PA-C [facility] [phone]  Clinic MD:  Pt seen and examined with William Burke   I agree with the above.  The patient is status post right femoral endarterectomy.  He does complain of some discomfort around the area and some fullness in the top of his incision.  On examination he has next one William Burke on his right foot.  I probed the upper aspect of the incision with a Q-tip and got into a small seroma.  Serous fluid was evacuated.  I have told the patient to keep the area dry with a gauze.  I'm putting him on antibiotics for prophylactic reasons.  He will follow-up  with me in 2-3 weeks for wound check.  William Burke

## 2015-09-13 NOTE — Discharge Summary (Signed)
[facility] Discharge Summary   Patient ID:  William Burke MRN: [id number] DOB/AGE: 04/09/1940 75 y.o.  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 Anemia   . Arthritis    osteoarthritis knees, elbow right  . Carotid artery disease (HCC)    status post right carotid artery stenting 05/24/13  . Complication of anesthesia    sensitive to anesthesia ,longer to awaken-"doesn't require much"  . Complication of anesthesia    "can't sleep after anesthesia wears off and i become very anxious"   . Diabetes mellitus, type 2 (HCC)   . GERD (gastroesophageal reflux disease)    occ  . History of bronchitis   . History of pneumonia   . History of tobacco abuse   . Hyperlipidemia   . Hypertension   . Nocturia   . PAD (peripheral artery disease) (HCC) 06/22/2007   Bilateral iliac artery PTA and stenting with iCAST. Last lower extremity arterial Dopplers 02/25/2011 right ABI 1.0 left ABI 1.1.  Bilateral carotid artery stenosis. Last carotid Dopplers 07/18/2011: Max ICA stenosis 50-69% bilaterally  . UTI (lower urinary tract infection)   . Wears glasses     Procedure(s): RIGHT FEMORAL ENDARTERECTOMY PATCH ANGIOPLASTY  Discharged Condition: good  HPI: William Burke is a 75 y.o. male who is well known to me, having undergone right carotid endarterectomy and patch angioplasty right carotid stent for recurrent stenosis in 2014 and 15.  He has a history of peripheral vascular disease.  In June 2017 he underwent angiography by Dr. Gery Pray.  He was having claudication.  He has a history of bilateral iliac stents.  He was found to have a high-grade 90% stenosis within the right common iliac artery which was treated.  Patient has had  resolution of his symptoms.  A filling defect was visualized within the right common femoral and superficial femoral artery.  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  Patient has history of diabetes type 2.  He suffers from hypercholesterolemia which is managed with a statin.  He is on ACE inhibitor for hypertension.  He is a former smoker.  He is on dual antiplatelet therapy.   DATA:  Dr. Myra Gianotti reviewed the patient's arteriogram compared to the study done at 2009.  The filling defect in the right femoral artery was not present in 2009.  This does not appear to be mobile.  He suspected this was at least subacute, possibly chronic.   Hospital Course:  William Burke is a 75 y.o. male is S/P Right Procedure(s): RIGHT FEMORAL ENDARTERECTOMY PATCH ANGIOPLASTY  Palpable DP 2+ pulse right LE Groin soft without hematoma   Assessment/Planning: POD # 1 right femoral endarterectomy Ambulating, tolerating PO's and voided  D/C home, f/u with [facility] in 2 weeks   Significant Diagnostic Studies: CBC Lab Results  Component Value Date   WBC 6.8 08/31/2015   HGB 11.1 (L) 08/31/2015   HCT 34.5 (L) 08/31/2015   MCV 88.5 08/31/2015   PLT 140 (L) 08/31/2015    BMET    Component Value Date/Time   NA 134 (L) 08/31/2015 0446   K 3.9 08/31/2015 0446   CL 97 (L) 08/31/2015 0446   CO2 30 08/31/2015 0446   GLUCOSE 165 (H) 08/31/2015 0446   BUN 11 08/31/2015 0446   CREATININE  0.84 08/31/2015 0446   CREATININE 0.74 07/18/2015 1035   CALCIUM 9.2 08/31/2015 0446   GFRNONAA >60 08/31/2015 0446   GFRAA >60 08/31/2015 0446   COAG Lab Results  Component Value Date   INR 1.13 08/23/2015   INR 1.0 07/18/2015   INR 1.05 01/01/2015     Disposition:  Discharge to :Home Discharge Instructions    Call MD for:  redness, tenderness, or signs of infection (pain, swelling, bleeding, redness, odor or green/yellow discharge around incision site)    Complete by:  As directed   Call MD for:   severe or increased pain, loss or decreased feeling  in affected limb(s)    Complete by:  As directed   Call MD for:  temperature >100.5    Complete by:  As directed   Discharge instructions    Complete by:  As directed   You may shower daily.  Keep groin clean and dry.   Driving Restrictions    Complete by:  As directed   No driving for 1-2 weeks   Increase activity slowly    Complete by:  As directed   Walk with assistance use walker or cane as needed   Lifting restrictions    Complete by:  As directed   No heavy lifting for 6 weeks   Resume previous diet    Complete by:  As directed       Medication List    TAKE these medications   aspirin EC 81 MG tablet Take 81 mg by mouth daily.   atorvastatin 80 MG tablet Commonly known as:  LIPITOR Take 1 tablet (80 mg total) by mouth daily at 6 PM.   clopidogrel 75 MG tablet Commonly known as:  PLAVIX Take 1 tablet (75 mg total) by mouth daily.   diltiazem 240 MG 24 hr capsule Commonly known as:  TIAZAC Take 240 mg by mouth daily.   ferrous sulfate 325 (65 FE) MG tablet Take 325 mg by mouth 2 (two) times daily with a meal.   fluticasone 50 MCG/ACT nasal spray Commonly known as:  FLONASE Place 1 spray into both nostrils at bedtime as needed.   glipiZIDE 10 MG tablet Commonly known as:  GLUCOTROL Take 1 tablet by mouth daily before breakfast.   lisinopril-hydrochlorothiazide 20-12.5 MG tablet Commonly known as:  PRINZIDE,ZESTORETIC Take 1 tablet by mouth daily.   metFORMIN 1000 MG tablet Commonly known as:  GLUCOPHAGE Take 1,000 mg by mouth 2 (two) times daily with a meal.   omeprazole 20 MG capsule Commonly known as:  PRILOSEC Take 20 mg by mouth 2 (two) times daily.   oxyCODONE-acetaminophen 5-325 MG tablet Commonly known as:  PERCOCET/ROXICET Take 1 tablet by mouth every 6 (six) hours as needed for moderate pain.   sitaGLIPtin 100 MG tablet Commonly known as:  JANUVIA Take 50 mg by mouth daily.   terazosin 10  MG capsule Commonly known as:  HYTRIN Take 10 mg by mouth at bedtime.      Verbal and written Discharge instructions given to the patient. Wound care per Discharge AVS   Signed: Clinton Gallant [facility] 09/13/2015, 2:29 PM

## 2015-09-17 ENCOUNTER — Encounter: Payer: Self-pay | Admitting: Surgery

## 2015-09-17 ENCOUNTER — Ambulatory Visit (INDEPENDENT_AMBULATORY_CARE_PROVIDER_SITE_OTHER): Payer: Medicare Other | Admitting: Surgery

## 2015-09-17 DIAGNOSIS — I739 Peripheral vascular disease, unspecified: Secondary | ICD-10-CM

## 2015-09-17 MED ORDER — CEPHALEXIN 500 MG PO CAPS: 500.0000 mg | ORAL_CAPSULE | Freq: Three times a day (TID) | ORAL | 1 refills | Status: DC

## 2015-09-17 NOTE — Progress Notes (Signed)
  POST OPERATIVE OFFICE NOTE    CC:  F/u for surgery  HPI:  [name] who is s/p  Right distal external iliac, common femoral, profundofemoral, and superficial femoral artery endarterectomy with bovine pericardial patch angioplasty.  He states he is worried about the clear/brown drainage on the dry guaze from his right groin incision.  He has not had any fever or chills.    No Known Allergies  Current Outpatient Prescriptions  Medication Sig Dispense Refill  . aspirin EC 81 MG tablet Take 81 mg by mouth daily.    [name] atorvastatin (LIPITOR) 80 MG tablet Take 1 tablet (80 mg total) by mouth daily at 6 PM. 30 tablet 3  . cephALEXin (KEFLEX) 500 MG capsule Take 1 capsule (500 mg total) by mouth 3 (three) times daily. 21 capsule 1  . clopidogrel (PLAVIX) 75 MG tablet Take 1 tablet (75 mg total) by mouth daily. 30 tablet 3  . diltiazem (TIAZAC) 240 MG 24 hr capsule Take 240 mg by mouth daily.     . ferrous sulfate 325 (65 FE) MG tablet Take 325 mg by mouth 2 (two) times daily with a meal.    . fluticasone (FLONASE) 50 MCG/ACT nasal spray Place 1 spray into both nostrils at bedtime as needed.     [name] glipiZIDE (GLUCOTROL) 10 MG tablet Take 1 tablet by mouth daily before breakfast.     . lisinopril-hydrochlorothiazide (PRINZIDE,ZESTORETIC) 20-12.5 MG tablet Take 1 tablet by mouth daily.  1  . metFORMIN (GLUCOPHAGE) 1000 MG tablet Take 1,000 mg by mouth 2 (two) times daily with a meal.    . omeprazole (PRILOSEC) 20 MG capsule Take 20 mg by mouth 2 (two) times daily.  1  . oxyCODONE-acetaminophen (PERCOCET/ROXICET) 5-325 MG tablet Take 1 tablet by mouth every 6 (six) hours as needed for moderate pain. 10 tablet 0  . sitaGLIPtin (JANUVIA) 100 MG tablet Take 50 mg by mouth daily.    [name] terazosin (HYTRIN) 10 MG capsule Take 10 mg by mouth at bedtime.     No current facility-administered medications for this visit.      ROS:  See HPI  Physical Exam:  Vitals:   09/17/15 1255  BP: (!)  186/76  Pulse: 78  Resp: 16  Temp: 98.4 F (36.9 C)    Incision:  No erythema or active drainage.  Superficial incisional separation proximally. Extremities:  DP pulse palpable   Assessment/Plan:  [name] who is s/p: Superficial incisional separation without active drainage or erythema.  I have placed him on keflex 500 mg TID for 7 days.  He was instruction to wash the right groin with soap and water daily and then dry thoroughly.  Dry guaze to the area to help with moisture.  He is working full time.   He will keep his f/u appointment with Dr. Fae Pippin, Jahaziel Francois [facility] PA-C [facility] [phone]  Patient seen by M. Toddy Boyd.  I was not available to see the patient   Annamarie Major

## 2015-09-19 ENCOUNTER — Ambulatory Visit: Payer: Medicare Other | Admitting: Cardiovascular Disease

## 2015-10-02 ENCOUNTER — Other Ambulatory Visit: Payer: Self-pay | Admitting: Surgery

## 2015-10-02 DIAGNOSIS — I739 Peripheral vascular disease, unspecified: Secondary | ICD-10-CM

## 2015-10-04 ENCOUNTER — Encounter: Payer: Self-pay | Admitting: Surgery

## 2015-10-08 ENCOUNTER — Ambulatory Visit (INDEPENDENT_AMBULATORY_CARE_PROVIDER_SITE_OTHER): Payer: Medicare Other | Admitting: Surgery

## 2015-10-08 ENCOUNTER — Encounter: Payer: Self-pay | Admitting: Surgery

## 2015-10-08 VITALS — BP 168/79 | HR 79 | Ht 65.0 in | Wt 192.8 lb

## 2015-10-08 DIAGNOSIS — I70211 Atherosclerosis of native arteries of extremities with intermittent claudication, right leg: Secondary | ICD-10-CM

## 2015-10-08 NOTE — Progress Notes (Signed)
Patient name: William Burke MRN: [id number] DOB: 1940/08/20 Sex: male  REASON FOR VISIT: post-op  HPI: William Burke is a 75 y.o. male who returns today for follow-up.  On 08/30/2015 he underwent iliofemoral endarterectomy with bovine pericardial patch angioplasty.  He was seen postoperatively for drainage from his right groin and started on antibiotics.  He is back today for follow-up.  Current Outpatient Prescriptions  Medication Sig Dispense Refill  . aspirin EC 81 MG tablet Take 81 mg by mouth daily.    [name] atorvastatin (LIPITOR) 80 MG tablet Take 1 tablet (80 mg total) by mouth daily at 6 PM. 30 tablet 3  . cephALEXin (KEFLEX) 500 MG capsule Take 1 capsule (500 mg total) by mouth 3 (three) times daily. 21 capsule 1  . clopidogrel (PLAVIX) 75 MG tablet Take 1 tablet (75 mg total) by mouth daily. 30 tablet 3  . diltiazem (TIAZAC) 240 MG 24 hr capsule Take 240 mg by mouth daily.     . ferrous sulfate 325 (65 FE) MG tablet Take 325 mg by mouth 2 (two) times daily with a meal.    . fluticasone (FLONASE) 50 MCG/ACT nasal spray Place 1 spray into both nostrils at bedtime as needed.     [name] glipiZIDE (GLUCOTROL) 10 MG tablet Take 1 tablet by mouth daily before breakfast.     . lisinopril-hydrochlorothiazide (PRINZIDE,ZESTORETIC) 20-12.5 MG tablet Take 1 tablet by mouth daily.  1  . metFORMIN (GLUCOPHAGE) 1000 MG tablet Take 1,000 mg by mouth 2 (two) times daily with a meal.    . omeprazole (PRILOSEC) 20 MG capsule Take 20 mg by mouth 2 (two) times daily.  1  . oxyCODONE-acetaminophen (PERCOCET/ROXICET) 5-325 MG tablet Take 1 tablet by mouth every 6 (six) hours as needed for moderate pain. 10 tablet 0  . sitaGLIPtin (JANUVIA) 100 MG tablet Take 50 mg by mouth daily.    [name] terazosin (HYTRIN) 10 MG capsule Take 10 mg by mouth at bedtime.     No current facility-administered medications for this visit.     REVIEW OF SYSTEMS:  [X]  denotes positive finding, [ ]   denotes negative finding Cardiac  Comments:  Chest pain or chest pressure:    Shortness of breath upon exertion:    Short of breath when lying flat:    Irregular heart rhythm:    Constitutional    Fever or chills:      PHYSICAL EXAM: Vitals:   10/08/15 0843 10/08/15 0845  BP: (!) 160/80 (!) 168/79  Pulse: 79   SpO2: 97%   Weight: 192 lb 12.8 oz (87.5 kg)   Height: 5\' 5"  (1.651 m)     GENERAL: The patient is a well-nourished male, in no acute distress. The vital signs are documented above. CARDIOVASCULAR: There is a regular rate and rhythm. PULMONARY: There is good air exchange bilaterally without wheezing or rales. There are 2 small areas with very superficial skin separation.  I tried to probe these areas with a Q-tip.  There was no tract.  The area was cauterized with silver nitrate  MEDICAL ISSUES: Patient is scheduled for follow-up in 3 months.  He will get his ultrasound with Dr. Allyson Sabal.  He will contact me sooner if his wound continues to give him difficulty  Durene Cal, MD [facility] [phone] Pager [phone]

## 2015-10-15 ENCOUNTER — Telehealth: Payer: Self-pay | Admitting: *Deleted

## 2015-10-15 DIAGNOSIS — I739 Peripheral vascular disease, unspecified: Secondary | ICD-10-CM

## 2015-10-15 NOTE — Telephone Encounter (Signed)
-----   Message from Lorretta Harp, MD sent at 10/08/2015  3:17 PM EDT ----- Anderson Malta, can you make sure that Mr Austerman has a LEA in 3 months then ROV with me.  Thx, JJB ----- Message ----- From: Serafina Mitchell, MD Sent: 10/08/2015   8:57 AM To: Lorretta Harp, MD

## 2015-10-15 NOTE — Telephone Encounter (Signed)
Called patient and discussed with him that Dr Gwenlyn Found wanted to see him in 3 months and have lower extremity dopplers prior to appt. He verbalized understanding. Appt scheduled with Dr Gwenlyn Found for 12/28/15 and message sent to vascular to schedule dopplers.

## 2015-11-21 ENCOUNTER — Ambulatory Visit: Payer: Medicare Other | Admitting: Cardiovascular Disease

## 2015-12-18 ENCOUNTER — Ambulatory Visit (HOSPITAL_COMMUNITY)
Admission: RE | Admit: 2015-12-18 | Discharge: 2015-12-18 | Disposition: A | Discharge status: Home / Self Care | Payer: Medicare Other | Source: Ambulatory Visit | Attending: Cardiovascular Disease | Admitting: Cardiovascular Disease

## 2015-12-18 ENCOUNTER — Other Ambulatory Visit: Payer: Self-pay | Admitting: Cardiovascular Disease

## 2015-12-18 DIAGNOSIS — I739 Peripheral vascular disease, unspecified: Secondary | ICD-10-CM

## 2015-12-18 DIAGNOSIS — I708 Atherosclerosis of other arteries: Secondary | ICD-10-CM | POA: Diagnosis not present

## 2015-12-18 DIAGNOSIS — E1151 Type 2 diabetes mellitus with diabetic peripheral angiopathy without gangrene: Secondary | ICD-10-CM | POA: Insufficient documentation

## 2015-12-18 DIAGNOSIS — I10 Essential (primary) hypertension: Secondary | ICD-10-CM | POA: Insufficient documentation

## 2015-12-18 DIAGNOSIS — Z87891 Personal history of nicotine dependence: Secondary | ICD-10-CM | POA: Diagnosis not present

## 2015-12-19 ENCOUNTER — Other Ambulatory Visit: Payer: Self-pay | Admitting: Cardiovascular Disease

## 2015-12-19 DIAGNOSIS — I739 Peripheral vascular disease, unspecified: Secondary | ICD-10-CM

## 2015-12-28 ENCOUNTER — Encounter: Payer: Self-pay | Admitting: Cardiovascular Disease

## 2015-12-28 ENCOUNTER — Ambulatory Visit (INDEPENDENT_AMBULATORY_CARE_PROVIDER_SITE_OTHER): Payer: Medicare Other | Admitting: Cardiovascular Disease

## 2015-12-28 VITALS — BP 178/74 | HR 71 | Ht 65.0 in | Wt 194.0 lb

## 2015-12-28 DIAGNOSIS — I779 Disorder of arteries and arterioles, unspecified: Secondary | ICD-10-CM

## 2015-12-28 DIAGNOSIS — I739 Peripheral vascular disease, unspecified: Secondary | ICD-10-CM | POA: Diagnosis not present

## 2015-12-28 DIAGNOSIS — I10 Essential (primary) hypertension: Secondary | ICD-10-CM

## 2015-12-28 DIAGNOSIS — E7800 Pure hypercholesterolemia, unspecified: Secondary | ICD-10-CM

## 2015-12-28 NOTE — Assessment & Plan Note (Signed)
History of hypertension blood pressure measured at 170/74. He says this is slightly higher than it usually runs at home but he runs systolics in the 0000000 range. He feels poorly with lower blood pressures. He just took his blood pressure medicines prior to coming to the office. He is on diltiazem, lisinopril and hydrochlorothiazide. Continue current meds at current dosing

## 2015-12-28 NOTE — Progress Notes (Signed)
12/28/2015 RANDYN FELAND   [birth year]  [id number]  Primary Physician Mady Gemma, PA-C Primary Cardiologist: Runell Gess MD [name]  HPI:  The patient is a very pleasant 75 year old mildly to moderately overweight married Caucasian male father of 11, grandfather to 6 grandchildren who I saw 08/21/15.[name] He has a history of bilateral iliac artery PTA and stenting by myself Jun 22, 2007 using iCAST covered stents for lifestyle-limiting claudication. This resulted in marked improvement in his symptoms on Dopplers. His other problems include non-insulin-requiring diabetes, hypertension and hyperlipidemia as well as remote tobacco abuse. He denies chest pain or shortness of breath. A Myoview performed in April of 2009 was nonischemic. An echo revealed normal LV systolic function with mild concentric LVH. He does have bilateral carotid disease which by duplex ultrasound performed this past June showed progression of disease on both sides. He is neurologically asymptomatic. Recent lower extremity Dopplers performed 06/24/12 showed his stents to be widely patent.carotid Dopplers performed on the same day showed significant progression of disease on the right side now with a hemodynamic significant stenosis. Most recent lab work performed earlier this month revealed a total cholesterol of 157, LDL of 89, and HDL of 38.  He had carotid Dopplers performed 08/06/12 that showed significant progression of disease on the right side now in the critical range. He does complain of some "tingling" in his right cheek. He is intolerant to Plavix because of epistaxis. Dr. Myra Gianotti and I performed right internal carotid artery stenting on Mr. Smrekar 05/24/13 using distal embolic protection. The angiographic and clinical result excellent. Patient denies chest pain or shortness of breath but does complain of relatively new onset right lower extremity claudication. His ABI has fallen 2.65 on the right which  is significant compared to his prior Doppler 2015 when it was 0.95.He has had a left total knee replacement performed by Dr. Charlann Boxer in December of last year. Because of progressive claudication and Dopplers that suggested a high-grade right internal iliac stenosis with a decline in his right ABI to .65. Angiogram him on 07/23/15 revealing 99% "in-stent restenosis within the previously placed right common iliac artery stent. I restented him with a [employer] covered stent resulting in reduction of his stenosis to 30% residual. He did have 95% thrombotic occlusion of his distal right common femoral, proximal SFA and profunda branches. He underwent right common femoral, profunda and SFA endarterectomy and patch angioplasty by Dr. Myra Gianotti 08/31/15 with an excellent technical result. His wound has finally healed. His most recent Dopplers performed 12/18/15 revealed normalization of his right ABI up to 1.1 from 0.65 with normal velocities. His claudication has resolved on that side.   Current Outpatient Prescriptions  Medication Sig Dispense Refill  . aspirin EC 81 MG tablet Take 81 mg by mouth daily.    [name] atorvastatin (LIPITOR) 80 MG tablet Take 1 tablet (80 mg total) by mouth daily at 6 PM. 30 tablet 3  . cephALEXin (KEFLEX) 500 MG capsule Take 1 capsule (500 mg total) by mouth 3 (three) times daily. 21 capsule 1  . clopidogrel (PLAVIX) 75 MG tablet Take 1 tablet (75 mg total) by mouth daily. 30 tablet 3  . diltiazem (TIAZAC) 240 MG 24 hr capsule Take 240 mg by mouth daily.     . ferrous sulfate 325 (65 FE) MG tablet Take 325 mg by mouth 2 (two) times daily with a meal.    . fluticasone (FLONASE) 50 MCG/ACT nasal spray Place 1 spray into  both nostrils at bedtime as needed.     [name] glipiZIDE (GLUCOTROL) 10 MG tablet Take 1 tablet by mouth daily before breakfast.     . lisinopril-hydrochlorothiazide (PRINZIDE,ZESTORETIC) 20-12.5 MG tablet Take 1 tablet by mouth daily.  1  . metFORMIN (GLUCOPHAGE) 1000 MG tablet  Take 1,000 mg by mouth 2 (two) times daily with a meal.    . omeprazole (PRILOSEC) 20 MG capsule Take 20 mg by mouth 2 (two) times daily.  1  . oxyCODONE-acetaminophen (PERCOCET/ROXICET) 5-325 MG tablet Take 1 tablet by mouth every 6 (six) hours as needed for moderate pain. 10 tablet 0  . sitaGLIPtin (JANUVIA) 100 MG tablet Take 50 mg by mouth daily.    [name] terazosin (HYTRIN) 10 MG capsule Take 10 mg by mouth at bedtime.     No current facility-administered medications for this visit.     No Known Allergies  Social History   Social History  . Marital status: Married    Spouse name: N/A  . Number of children: N/A  . Years of education: N/A   Occupational History  . Not on file.   Social History Main Topics  . Smoking status: Former Smoker    Packs/day: 3.00    Years: 25.00    Types: Cigarettes    Quit date: 08/06/1987  . Smokeless tobacco: Never Used  . Alcohol use No  . Drug use: No  . Sexual activity: Not on file   Other Topics Concern  . Not on file   Social History Narrative  . No narrative on file     Review of Systems: General: negative for chills, fever, night sweats or weight changes.  Cardiovascular: negative for chest pain, dyspnea on exertion, edema, orthopnea, palpitations, paroxysmal nocturnal dyspnea or shortness of breath Dermatological: negative for rash Respiratory: negative for cough or wheezing Urologic: negative for hematuria Abdominal: negative for nausea, vomiting, diarrhea, bright red blood per rectum, melena, or hematemesis Neurologic: negative for visual changes, syncope, or dizziness All other systems reviewed and are otherwise negative except as noted above.    Blood pressure (!) 178/74, pulse 71, height 5\' 5"  (1.651 m), weight 194 lb (88 kg).  General appearance: alert and no distress Neck: no adenopathy, no JVD, supple, symmetrical, trachea midline, thyroid not enlarged, symmetric, no tenderness/mass/nodules and Soft bilateral carotid  bruits Lungs: clear to auscultation bilaterally Heart: regular rate and rhythm, S1, S2 normal, no murmur, click, rub or gallop Extremities: extremities normal, atraumatic, no cyanosis or edema  EKG sinus rhythm at 71 with right bundle-branch block and left axis deviation. I personally reviewed this EKG  ASSESSMENT AND PLAN:   PAD (peripheral artery disease) (HCC) History of peripheral arterial disease status post bilateral iliac artery PTA and stenting using a cast covered stents. Because of right lower extremity claudication he underwent re-angiography by myself 07/24/15 revealing 99% "in-stent restenosis" within his right common iliac artery stent which I restented. He denies any fibrous and chronic thrombotic occlusion of his right common femoral artery at the fundus/SFA bifurcation. He had 30-40% "in-stent restenosis" within the left common iliac artery stent. He had three-vessel runoff bilaterally. Dr. Myra Gianotti performed right common femoral, profunda and SFA endarterectomy with patch angioplasty 08/31/15 with an excellent result. The wound has finally healed. His most recent Dopplers performed 12/18/15 revealed a right ABI of 1.1 with normal velocities. His claudication has resolved as well.  Hypertension History of hypertension blood pressure measured at 170/74. He says this is slightly higher than it usually runs  at home but he runs systolics in the 160 range. He feels poorly with lower blood pressures. He just took his blood pressure medicines prior to coming to the office. He is on diltiazem, lisinopril and hydrochlorothiazide. Continue current meds at current dosing  Hyperlipidemia History of hyperlipidemia on statin therapy followed by his PCP  Carotid artery disease (HCC) History of carotid artery disease status post right internal carotid artery stenting by myself and Dr. Myra Gianotti 05/16/13. Carotid Dopplers performed 06/19/15 revealed a widely patent right internal carotid artery stent with  moderate left ICA stenosis which has remained stable.      Runell Gess MD FACP,FACC,FAHA, [facility] 12/28/2015 9:44 AM

## 2015-12-28 NOTE — Assessment & Plan Note (Signed)
History of carotid artery disease status post right internal carotid artery stenting by myself and Dr. Trula Slade 05/16/13. Carotid Dopplers performed 06/19/15 revealed a widely patent right internal carotid artery stent with moderate left ICA stenosis which has remained stable.

## 2015-12-28 NOTE — Assessment & Plan Note (Signed)
History of hyperlipidemia on statin therapy followed by his PCP 

## 2015-12-28 NOTE — Assessment & Plan Note (Signed)
History of peripheral arterial disease status post bilateral iliac artery PTA and stenting using a cast covered stents. Because of right lower extremity claudication he underwent re-angiography by myself 07/24/15 revealing 99% "in-stent restenosis" within his right common iliac artery stent which I restented. He denies any fibrous and chronic thrombotic occlusion of his right common femoral artery at the fundus/SFA bifurcation. He had 30-40% "in-stent restenosis" within the left common iliac artery stent. He had three-vessel runoff bilaterally. Dr. Trula Slade performed right common femoral, profunda and SFA endarterectomy with patch angioplasty 08/31/15 with an excellent result. The wound has finally healed. His most recent Dopplers performed 12/18/15 revealed a right ABI of 1.1 with normal velocities. His claudication has resolved as well.

## 2015-12-28 NOTE — Patient Instructions (Signed)
Medication Instructions: Your physician recommends that you continue on your current medications as directed. Please refer to the Current Medication list given to you today.   Labwork: I will request labs from Dr. Deatra Ina.  Testing/Procedures: Your physician has requested that you have a lower extremity arterial duplex. During this test, ultrasound is used to evaluate arterial blood flow in the legs. Allow one hour for this exam. There are no restrictions or special instructions.  Your physician has requested that you have an ankle brachial index (ABI). During this test an ultrasound and blood pressure cuff are used to evaluate the arteries that supply the arms and legs with blood. Allow thirty minutes for this exam. There are no restrictions or special instructions.  Your physician has requested that you have a carotid duplex. This test is an ultrasound of the carotid arteries in your neck. It looks at blood flow through these arteries that supply the brain with blood. Allow one hour for this exam. There are no restrictions or special instructions.  ---In May 2018.  Follow-Up: Your physician wants you to follow-up in: 1 year with Dr. Gwenlyn Found. You will receive a reminder letter in the mail two months in advance. If you don't receive a letter, please call our office to schedule the follow-up appointment.  If you need a refill on your cardiac medications before your next appointment, please call your pharmacy.

## 2016-01-02 ENCOUNTER — Encounter: Payer: Self-pay | Admitting: Surgery

## 2016-01-07 ENCOUNTER — Ambulatory Visit (INDEPENDENT_AMBULATORY_CARE_PROVIDER_SITE_OTHER): Payer: Medicare Other | Admitting: Surgery

## 2016-01-07 ENCOUNTER — Encounter: Payer: Self-pay | Admitting: Surgery

## 2016-01-07 VITALS — BP 194/85 | HR 76 | Temp 98.1°F | Wt 190.0 lb

## 2016-01-07 DIAGNOSIS — I70211 Atherosclerosis of native arteries of extremities with intermittent claudication, right leg: Secondary | ICD-10-CM

## 2016-01-07 NOTE — Progress Notes (Signed)
[facility] [place]  Patient name: William Burke MRN: [id number] DOB: 02/23/1940 Sex: male  REASON FOR VISIT: follow up  HPI: William Burke is a 75 y.o. male who returns today for follow-up.  On 08/30/2015 he underwent iliofemoral endarterectomy with bovine pericardial patch angioplasty.  He was seen postoperatively for drainage from his right groin and started on antibiotics. His wounds have all healed.  He has no complaints of claudication.  He is walking 3 miles per day  Past Medical History:  Diagnosis Date  . Anemia   . Arthritis    osteoarthritis knees, elbow right  . Carotid artery disease (HCC)    status post right carotid artery stenting 05/24/13  . Complication of anesthesia    sensitive to anesthesia ,longer to awaken-"doesn't require much"  . Complication of anesthesia    "can't sleep after anesthesia wears off and i become very anxious"   . Diabetes mellitus, type 2 (HCC)   . GERD (gastroesophageal reflux disease)    occ  . History of bronchitis   . History of pneumonia   . History of tobacco abuse   . Hyperlipidemia   . Hypertension   . Nocturia   . PAD (peripheral artery disease) (HCC) 06/22/2007   Bilateral iliac artery PTA and stenting with iCAST. Last lower extremity arterial Dopplers 02/25/2011 right ABI 1.0 left ABI 1.1.  Bilateral carotid artery stenosis. Last carotid Dopplers 07/18/2011: Max ICA stenosis 50-69% bilaterally  . UTI (lower urinary tract infection)   . Wears glasses     Family History  Problem Relation Age of Onset  . Diabetes Mother   . Hypertension Mother   . Cerebral aneurysm Mother   . Multiple sclerosis Father   . Kidney disease Father     SOCIAL HISTORY: Social History  Substance Use Topics  . Smoking status: Former Smoker    Packs/day: 3.00    Years: 25.00    Types: Cigarettes    Quit date: 08/06/1987  . Smokeless tobacco: Never Used  . Alcohol use No    No  Known Allergies  Current Outpatient Prescriptions  Medication Sig Dispense Refill  . aspirin EC 81 MG tablet Take 81 mg by mouth daily.    [name] atorvastatin (LIPITOR) 80 MG tablet Take 1 tablet (80 mg total) by mouth daily at 6 PM. 30 tablet 3  . cephALEXin (KEFLEX) 500 MG capsule Take 1 capsule (500 mg total) by mouth 3 (three) times daily. 21 capsule 1  . clopidogrel (PLAVIX) 75 MG tablet Take 1 tablet (75 mg total) by mouth daily. 30 tablet 3  . diltiazem (TIAZAC) 240 MG 24 hr capsule Take 240 mg by mouth daily.     . ferrous sulfate 325 (65 FE) MG tablet Take 325 mg by mouth 2 (two) times daily with a meal.    . fluticasone (FLONASE) 50 MCG/ACT nasal spray Place 1 spray into both nostrils at bedtime as needed.     [name] glipiZIDE (GLUCOTROL) 10 MG tablet Take 1 tablet by mouth daily before breakfast.     . lisinopril-hydrochlorothiazide (PRINZIDE,ZESTORETIC) 20-12.5 MG tablet Take 1 tablet by mouth daily.  1  . metFORMIN (GLUCOPHAGE) 1000 MG tablet Take 1,000 mg by mouth 2 (two) times daily with a meal.    . omeprazole (PRILOSEC) 20 MG capsule Take 20 mg by mouth 2 (two) times daily.  1  . oxyCODONE-acetaminophen (PERCOCET/ROXICET) 5-325 MG tablet Take 1 tablet by mouth every 6 (six) hours as needed  for moderate pain. 10 tablet 0  . sitaGLIPtin (JANUVIA) 100 MG tablet Take 50 mg by mouth daily.    [name] terazosin (HYTRIN) 10 MG capsule Take 10 mg by mouth at bedtime.     No current facility-administered medications for this visit.     REVIEW OF SYSTEMS:  [X]  denotes positive finding, [ ]  denotes negative finding Cardiac  Comments:  Chest pain or chest pressure:    Shortness of breath upon exertion:    Short of breath when lying flat:    Irregular heart rhythm:        Vascular    Pain in calf, thigh, or hip brought on by ambulation:    Pain in feet at night that wakes you up from your sleep:     Blood clot in your veins:    Leg swelling:         Pulmonary    Oxygen at home:      Productive cough:     Wheezing:         Neurologic    Sudden weakness in arms or legs:     Sudden numbness in arms or legs:     Sudden onset of difficulty speaking or slurred speech:    Temporary loss of vision in one eye:     Problems with dizziness:         Gastrointestinal    Blood in stool:     Vomited blood:         Genitourinary    Burning when urinating:     Blood in urine:        Psychiatric    Major depression:         Hematologic    Bleeding problems:    Problems with blood clotting too easily:        Skin    Rashes or ulcers:        Constitutional    Fever or chills:      PHYSICAL EXAM: Vitals:   01/07/16 0814  BP: (!) 194/85  Pulse: 76  Temp: 98.1 F (36.7 C)  Weight: 190 lb (86.2 kg)    GENERAL: The patient is a well-nourished male, in no acute distress. The vital signs are documented above. CARDIAC: There is a regular rate and rhythm.  VASCULAR: palpable femoral pulse PULMONARY: non-labored breating MUSCULOSKELETAL: There are no major deformities or cyanosis. NEUROLOGIC: No focal weakness or paresthesias are detected. SKIN: healed right groin incision PSYCHIATRIC: The patient has a normal affect.  DATA:  ABI  (R):  1.1  (L):  1.3  MEDICAL ISSUES: S/p right iliofemoral endarterectomy, normal post procedure duplex.  All wounds closed.  He will get long term surveillance at Dr. Hazle Coca office.  He will contact me if any future issues arise    Durene Cal, MD [facility] [phone] Pager [phone]

## 2016-02-12 DIAGNOSIS — R351 Nocturia: Secondary | ICD-10-CM | POA: Diagnosis not present

## 2016-02-12 DIAGNOSIS — R3914 Feeling of incomplete bladder emptying: Secondary | ICD-10-CM | POA: Diagnosis not present

## 2016-02-12 DIAGNOSIS — N3281 Overactive bladder: Secondary | ICD-10-CM | POA: Diagnosis not present

## 2016-02-20 ENCOUNTER — Telehealth: Payer: Self-pay | Admitting: Cardiovascular Disease

## 2016-02-20 NOTE — Telephone Encounter (Signed)
Spoke to patient. He explains that he has a Chiropodist with his insurance.  Every time he goes to a doctor, they send him a check -- but require that produces a copy of bill/explanation of services. He was here 4 months ago for his dopplers, and states he needs something showing this.  I confirmed his home address. States he would like it send either by standard mail or by email to:  [email or web address]  Patient aware I will need to send this to a different dept to follow up. Voiced thanks for return call.

## 2016-02-20 NOTE — Telephone Encounter (Signed)
Spoke to William Burke in billing who was able to give guidance on this. Referred to Patient Accounting, ph: [phone] (option 2 for profesional services). I relayed information to patient. Apologized for not being able to resolve inquiry for him in full. Pt voiced understanding and appreciation. I let him know to call if we may be able to help w anything further.

## 2016-02-20 NOTE — Telephone Encounter (Signed)
Pt needs a letter for his insurance company,says he wanted to give you the details.He felt it was no need to go over this information with me.

## 2016-02-21 DIAGNOSIS — H02121 Mechanical ectropion of right upper eyelid: Secondary | ICD-10-CM | POA: Diagnosis not present

## 2016-02-21 DIAGNOSIS — H02124 Mechanical ectropion of left upper eyelid: Secondary | ICD-10-CM | POA: Diagnosis not present

## 2016-02-21 DIAGNOSIS — H0289 Other specified disorders of eyelid: Secondary | ICD-10-CM | POA: Diagnosis not present

## 2016-04-01 DIAGNOSIS — H2513 Age-related nuclear cataract, bilateral: Secondary | ICD-10-CM | POA: Diagnosis not present

## 2016-05-22 DIAGNOSIS — E1165 Type 2 diabetes mellitus with hyperglycemia: Secondary | ICD-10-CM | POA: Diagnosis not present

## 2016-05-22 DIAGNOSIS — E7800 Pure hypercholesterolemia, unspecified: Secondary | ICD-10-CM | POA: Diagnosis not present

## 2016-05-22 DIAGNOSIS — K219 Gastro-esophageal reflux disease without esophagitis: Secondary | ICD-10-CM | POA: Diagnosis not present

## 2016-05-22 DIAGNOSIS — I10 Essential (primary) hypertension: Secondary | ICD-10-CM | POA: Diagnosis not present

## 2016-05-22 DIAGNOSIS — I779 Disorder of arteries and arterioles, unspecified: Secondary | ICD-10-CM | POA: Diagnosis not present

## 2016-05-22 DIAGNOSIS — I739 Peripheral vascular disease, unspecified: Secondary | ICD-10-CM | POA: Diagnosis not present

## 2016-05-22 DIAGNOSIS — E119 Type 2 diabetes mellitus without complications: Secondary | ICD-10-CM | POA: Diagnosis not present

## 2016-05-22 DIAGNOSIS — D508 Other iron deficiency anemias: Secondary | ICD-10-CM | POA: Diagnosis not present

## 2016-05-28 DIAGNOSIS — H04123 Dry eye syndrome of bilateral lacrimal glands: Secondary | ICD-10-CM | POA: Diagnosis not present

## 2016-05-28 DIAGNOSIS — H25813 Combined forms of age-related cataract, bilateral: Secondary | ICD-10-CM | POA: Diagnosis not present

## 2016-05-28 DIAGNOSIS — E119 Type 2 diabetes mellitus without complications: Secondary | ICD-10-CM | POA: Diagnosis not present

## 2016-05-28 DIAGNOSIS — H527 Unspecified disorder of refraction: Secondary | ICD-10-CM | POA: Diagnosis not present

## 2016-05-28 DIAGNOSIS — H02831 Dermatochalasis of right upper eyelid: Secondary | ICD-10-CM | POA: Diagnosis not present

## 2016-06-09 DIAGNOSIS — H01024 Squamous blepharitis left upper eyelid: Secondary | ICD-10-CM | POA: Diagnosis not present

## 2016-06-09 DIAGNOSIS — H04123 Dry eye syndrome of bilateral lacrimal glands: Secondary | ICD-10-CM | POA: Diagnosis not present

## 2016-06-09 DIAGNOSIS — Z09 Encounter for follow-up examination after completed treatment for conditions other than malignant neoplasm: Secondary | ICD-10-CM | POA: Diagnosis not present

## 2016-06-09 DIAGNOSIS — H01021 Squamous blepharitis right upper eyelid: Secondary | ICD-10-CM | POA: Diagnosis not present

## 2016-06-19 ENCOUNTER — Other Ambulatory Visit: Payer: Self-pay | Admitting: Cardiovascular Disease

## 2016-06-19 DIAGNOSIS — I739 Peripheral vascular disease, unspecified: Secondary | ICD-10-CM

## 2016-07-04 ENCOUNTER — Ambulatory Visit (HOSPITAL_COMMUNITY)
Admission: RE | Admit: 2016-07-04 | Discharge: 2016-07-04 | Disposition: A | Discharge status: Home / Self Care | Payer: PPO | Source: Ambulatory Visit | Attending: Cardiology | Admitting: Cardiology

## 2016-07-04 ENCOUNTER — Ambulatory Visit (HOSPITAL_COMMUNITY)
Admission: RE | Admit: 2016-07-04 | Discharge: 2016-07-04 | Disposition: A | Discharge status: Home / Self Care | Payer: PPO | Source: Ambulatory Visit | Attending: Cardiovascular Disease | Admitting: Cardiovascular Disease

## 2016-07-04 DIAGNOSIS — E785 Hyperlipidemia, unspecified: Secondary | ICD-10-CM | POA: Insufficient documentation

## 2016-07-04 DIAGNOSIS — E1151 Type 2 diabetes mellitus with diabetic peripheral angiopathy without gangrene: Secondary | ICD-10-CM | POA: Diagnosis not present

## 2016-07-04 DIAGNOSIS — I10 Essential (primary) hypertension: Secondary | ICD-10-CM | POA: Insufficient documentation

## 2016-07-04 DIAGNOSIS — I6523 Occlusion and stenosis of bilateral carotid arteries: Secondary | ICD-10-CM

## 2016-07-04 DIAGNOSIS — Z95820 Peripheral vascular angioplasty status with implants and grafts: Secondary | ICD-10-CM | POA: Diagnosis not present

## 2016-07-04 DIAGNOSIS — I779 Disorder of arteries and arterioles, unspecified: Secondary | ICD-10-CM | POA: Insufficient documentation

## 2016-07-04 DIAGNOSIS — I739 Peripheral vascular disease, unspecified: Secondary | ICD-10-CM

## 2016-07-04 DIAGNOSIS — I700 Atherosclerosis of aorta: Secondary | ICD-10-CM | POA: Diagnosis not present

## 2016-07-04 DIAGNOSIS — Z87898 Personal history of other specified conditions: Secondary | ICD-10-CM | POA: Insufficient documentation

## 2016-07-08 ENCOUNTER — Other Ambulatory Visit: Payer: Self-pay | Admitting: Cardiovascular Disease

## 2016-07-08 ENCOUNTER — Other Ambulatory Visit: Payer: Self-pay

## 2016-07-08 DIAGNOSIS — I739 Peripheral vascular disease, unspecified: Secondary | ICD-10-CM

## 2016-07-08 DIAGNOSIS — I6523 Occlusion and stenosis of bilateral carotid arteries: Secondary | ICD-10-CM

## 2016-07-15 DIAGNOSIS — H25813 Combined forms of age-related cataract, bilateral: Secondary | ICD-10-CM | POA: Diagnosis not present

## 2016-07-15 DIAGNOSIS — I10 Essential (primary) hypertension: Secondary | ICD-10-CM | POA: Diagnosis not present

## 2016-07-24 DIAGNOSIS — Z7902 Long term (current) use of antithrombotics/antiplatelets: Secondary | ICD-10-CM | POA: Diagnosis not present

## 2016-07-24 DIAGNOSIS — E119 Type 2 diabetes mellitus without complications: Secondary | ICD-10-CM | POA: Diagnosis not present

## 2016-07-24 DIAGNOSIS — E785 Hyperlipidemia, unspecified: Secondary | ICD-10-CM | POA: Diagnosis not present

## 2016-07-24 DIAGNOSIS — Z87891 Personal history of nicotine dependence: Secondary | ICD-10-CM | POA: Diagnosis not present

## 2016-07-24 DIAGNOSIS — H25812 Combined forms of age-related cataract, left eye: Secondary | ICD-10-CM | POA: Diagnosis not present

## 2016-07-24 DIAGNOSIS — Z7984 Long term (current) use of oral hypoglycemic drugs: Secondary | ICD-10-CM | POA: Diagnosis not present

## 2016-07-24 DIAGNOSIS — I10 Essential (primary) hypertension: Secondary | ICD-10-CM | POA: Diagnosis not present

## 2016-07-24 DIAGNOSIS — Z7982 Long term (current) use of aspirin: Secondary | ICD-10-CM | POA: Diagnosis not present

## 2016-07-24 DIAGNOSIS — H2181 Floppy iris syndrome: Secondary | ICD-10-CM | POA: Diagnosis not present

## 2016-07-24 DIAGNOSIS — K219 Gastro-esophageal reflux disease without esophagitis: Secondary | ICD-10-CM | POA: Diagnosis not present

## 2016-07-24 DIAGNOSIS — H2512 Age-related nuclear cataract, left eye: Secondary | ICD-10-CM | POA: Diagnosis not present

## 2016-07-24 DIAGNOSIS — H527 Unspecified disorder of refraction: Secondary | ICD-10-CM | POA: Diagnosis not present

## 2016-07-24 DIAGNOSIS — H04123 Dry eye syndrome of bilateral lacrimal glands: Secondary | ICD-10-CM | POA: Diagnosis not present

## 2016-07-24 DIAGNOSIS — Z79899 Other long term (current) drug therapy: Secondary | ICD-10-CM | POA: Diagnosis not present

## 2016-07-24 DIAGNOSIS — H25813 Combined forms of age-related cataract, bilateral: Secondary | ICD-10-CM | POA: Diagnosis not present

## 2016-07-24 IMAGING — CR DG CHEST 2V
2 series · 2 of 2 positions shown · non-contrast
Comparison: Chest x-ray 08/30/2012.

CLINICAL DATA: 74-year-old male under pre procedural evaluation
prior to stent placement in right leg. Former smoker (quit [id number]).

EXAM:
CHEST  2 VIEW

[w chest pa]
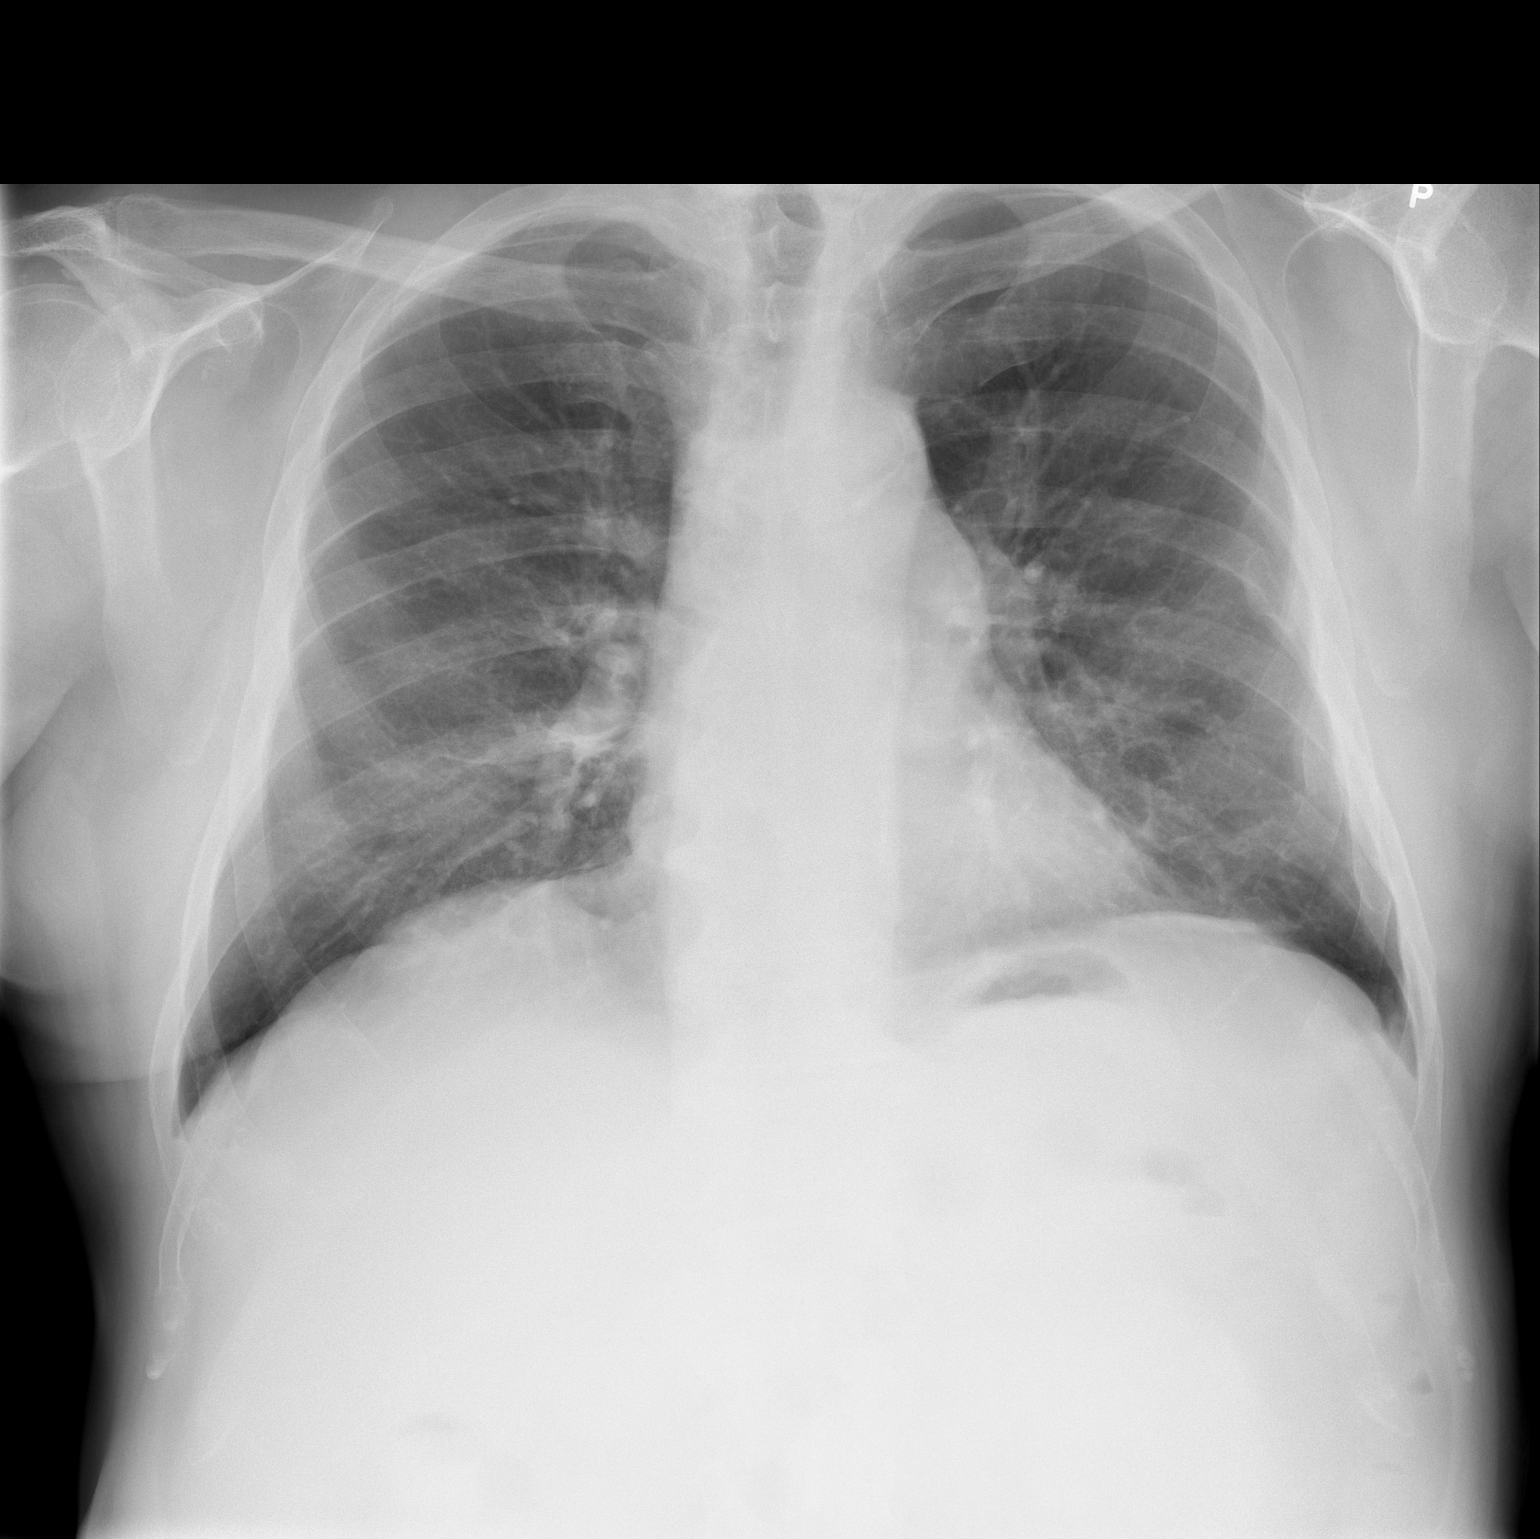

[w chest lat]
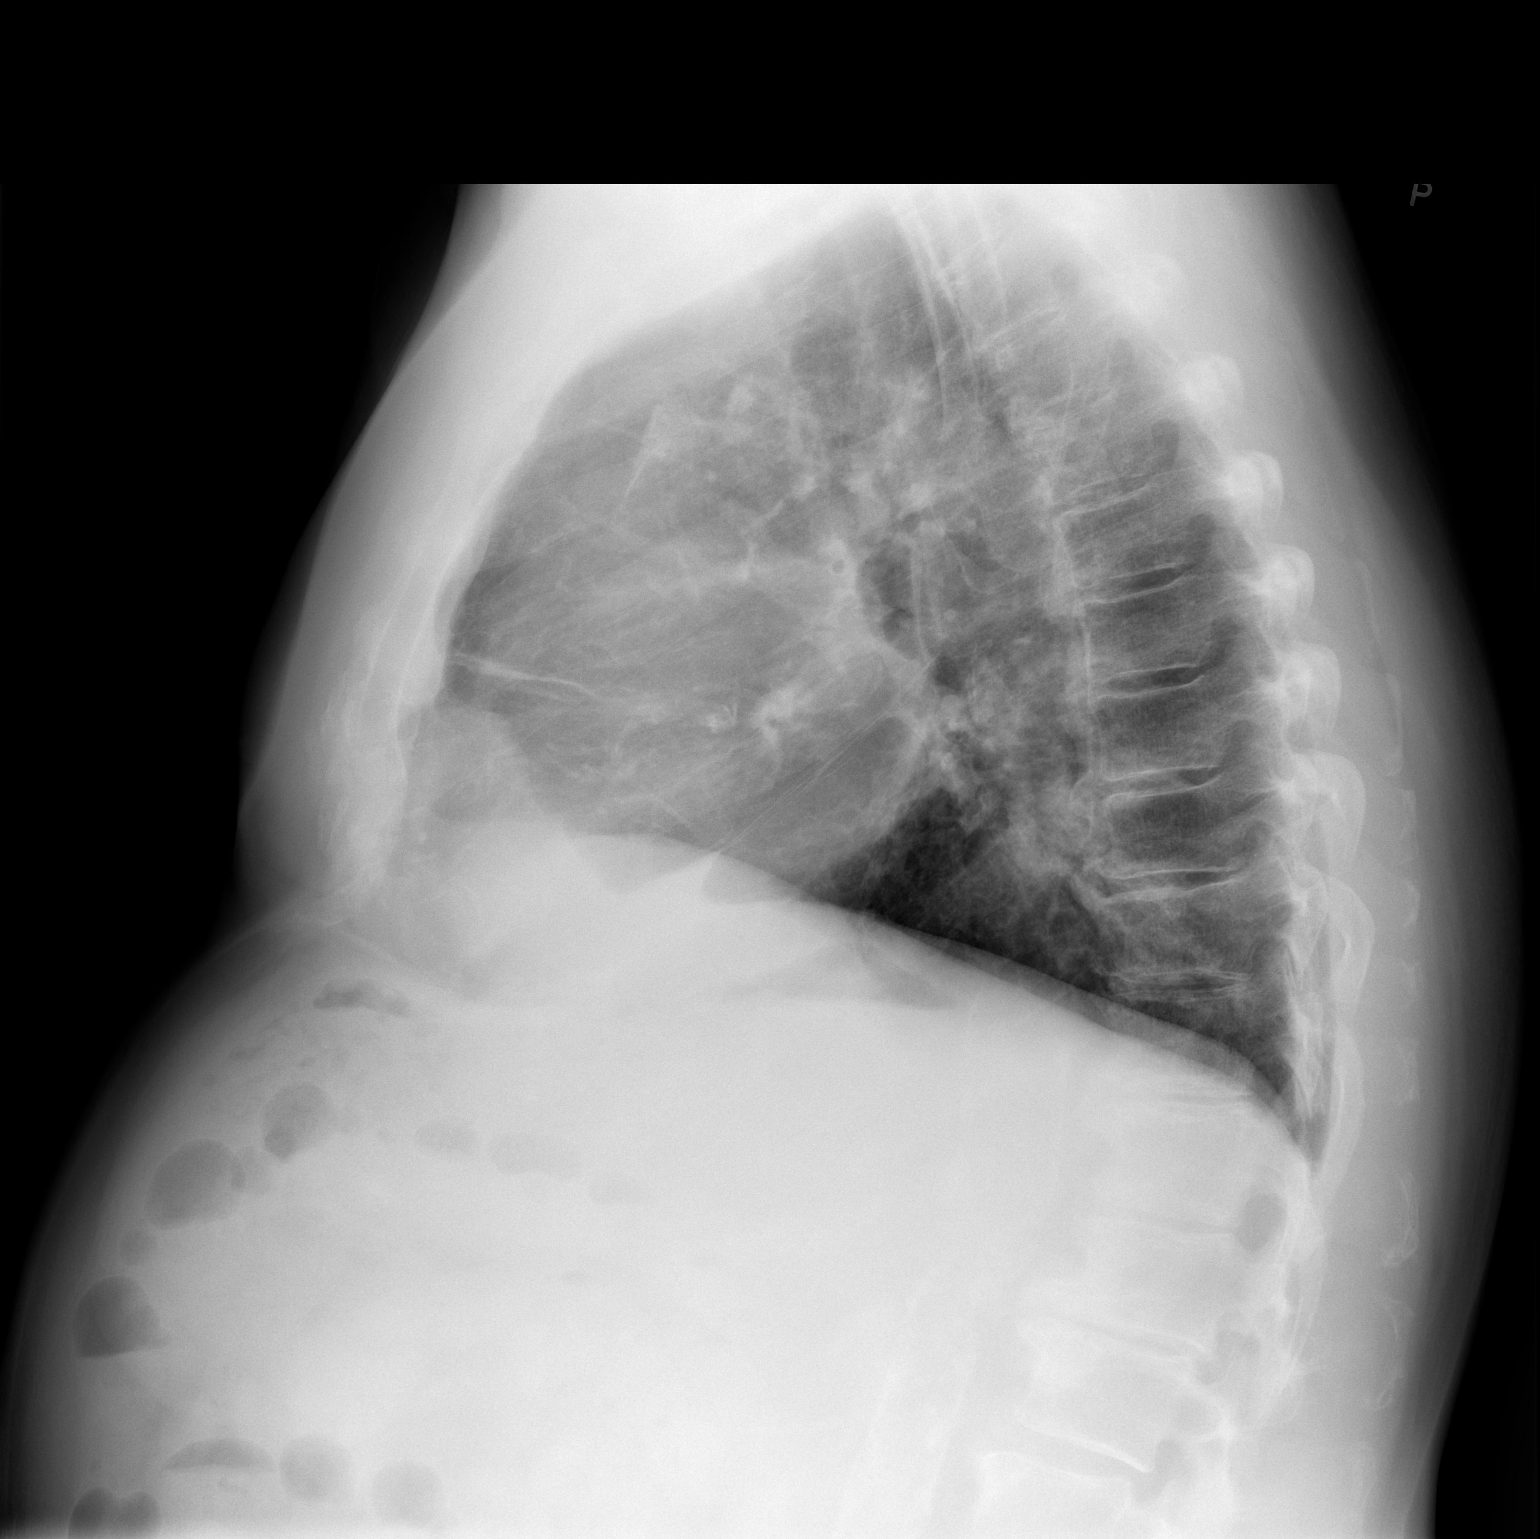

[2 of 2 positions shown; findings below may reference images not displayed]

FINDINGS: Lung volumes are normal. No consolidative airspace disease. No
pleural effusions. No pneumothorax. No pulmonary nodule or mass
noted. Linear scarring noted in the lingula. Pulmonary vasculature
and the cardiomediastinal silhouette are within normal limits.
Atherosclerotic calcifications in the thoracic aorta. Multiple old
healed left-sided rib fractures.
IMPRESSION: 1. No radiographic evidence of acute cardiopulmonary disease.
2. Aortic atherosclerosis.

## 2016-07-25 DIAGNOSIS — H04123 Dry eye syndrome of bilateral lacrimal glands: Secondary | ICD-10-CM | POA: Diagnosis not present

## 2016-07-25 DIAGNOSIS — Z961 Presence of intraocular lens: Secondary | ICD-10-CM | POA: Diagnosis not present

## 2016-07-25 DIAGNOSIS — H25811 Combined forms of age-related cataract, right eye: Secondary | ICD-10-CM | POA: Diagnosis not present

## 2016-07-25 DIAGNOSIS — E119 Type 2 diabetes mellitus without complications: Secondary | ICD-10-CM | POA: Diagnosis not present

## 2016-08-14 DIAGNOSIS — I10 Essential (primary) hypertension: Secondary | ICD-10-CM | POA: Diagnosis not present

## 2016-08-14 DIAGNOSIS — Z95820 Peripheral vascular angioplasty status with implants and grafts: Secondary | ICD-10-CM | POA: Diagnosis not present

## 2016-08-14 DIAGNOSIS — K219 Gastro-esophageal reflux disease without esophagitis: Secondary | ICD-10-CM | POA: Diagnosis not present

## 2016-08-14 DIAGNOSIS — E119 Type 2 diabetes mellitus without complications: Secondary | ICD-10-CM | POA: Diagnosis not present

## 2016-08-14 DIAGNOSIS — E785 Hyperlipidemia, unspecified: Secondary | ICD-10-CM | POA: Diagnosis not present

## 2016-08-14 DIAGNOSIS — Z7984 Long term (current) use of oral hypoglycemic drugs: Secondary | ICD-10-CM | POA: Diagnosis not present

## 2016-08-14 DIAGNOSIS — Z87891 Personal history of nicotine dependence: Secondary | ICD-10-CM | POA: Diagnosis not present

## 2016-08-14 DIAGNOSIS — H25811 Combined forms of age-related cataract, right eye: Secondary | ICD-10-CM | POA: Diagnosis not present

## 2016-08-14 DIAGNOSIS — Z7902 Long term (current) use of antithrombotics/antiplatelets: Secondary | ICD-10-CM | POA: Diagnosis not present

## 2016-08-14 DIAGNOSIS — H04123 Dry eye syndrome of bilateral lacrimal glands: Secondary | ICD-10-CM | POA: Diagnosis not present

## 2016-08-14 DIAGNOSIS — N400 Enlarged prostate without lower urinary tract symptoms: Secondary | ICD-10-CM | POA: Diagnosis not present

## 2016-08-14 DIAGNOSIS — Z7982 Long term (current) use of aspirin: Secondary | ICD-10-CM | POA: Diagnosis not present

## 2016-08-14 DIAGNOSIS — H25813 Combined forms of age-related cataract, bilateral: Secondary | ICD-10-CM | POA: Diagnosis not present

## 2016-08-14 DIAGNOSIS — H2513 Age-related nuclear cataract, bilateral: Secondary | ICD-10-CM | POA: Diagnosis not present

## 2016-09-08 DIAGNOSIS — H2513 Age-related nuclear cataract, bilateral: Secondary | ICD-10-CM | POA: Diagnosis not present

## 2016-12-22 DIAGNOSIS — E118 Type 2 diabetes mellitus with unspecified complications: Secondary | ICD-10-CM | POA: Diagnosis not present

## 2016-12-22 DIAGNOSIS — E785 Hyperlipidemia, unspecified: Secondary | ICD-10-CM | POA: Diagnosis not present

## 2016-12-24 DIAGNOSIS — I739 Peripheral vascular disease, unspecified: Secondary | ICD-10-CM | POA: Diagnosis not present

## 2016-12-24 DIAGNOSIS — I10 Essential (primary) hypertension: Secondary | ICD-10-CM | POA: Diagnosis not present

## 2016-12-24 DIAGNOSIS — E1165 Type 2 diabetes mellitus with hyperglycemia: Secondary | ICD-10-CM | POA: Diagnosis not present

## 2016-12-24 DIAGNOSIS — D649 Anemia, unspecified: Secondary | ICD-10-CM | POA: Diagnosis not present

## 2017-02-19 DIAGNOSIS — D649 Anemia, unspecified: Secondary | ICD-10-CM | POA: Diagnosis not present

## 2017-02-24 DIAGNOSIS — R69 Illness, unspecified: Secondary | ICD-10-CM | POA: Diagnosis not present

## 2017-03-19 ENCOUNTER — Telehealth: Payer: Self-pay | Admitting: *Deleted

## 2017-03-19 DIAGNOSIS — D509 Iron deficiency anemia, unspecified: Secondary | ICD-10-CM | POA: Diagnosis not present

## 2017-03-19 DIAGNOSIS — R195 Other fecal abnormalities: Secondary | ICD-10-CM | POA: Diagnosis not present

## 2017-03-19 DIAGNOSIS — Z1211 Encounter for screening for malignant neoplasm of colon: Secondary | ICD-10-CM | POA: Diagnosis not present

## 2017-03-19 DIAGNOSIS — Z800 Family history of malignant neoplasm of digestive organs: Secondary | ICD-10-CM | POA: Diagnosis not present

## 2017-03-19 NOTE — Telephone Encounter (Signed)
Called patient to schedule appointment with APP or Dr Gwenlyn Found. Left message for patient to contact office.

## 2017-03-19 NOTE — Telephone Encounter (Signed)
   Primary Cardiologist:Jonathan Gwenlyn Found, MD  Chart reviewed as part of pre-operative protocol coverage. Because of [place] past medical history and time since last visit, he/she will require a follow-up visit in order to better assess preoperative cardiovascular risk.  Pre-op covering staff: - Please schedule appointment and call patient to inform them. - Please contact requesting surgeon's office via preferred method (i.e, phone, fax) to inform them of need for appointment prior to surgery.  Rosaria Ferries, PA-C  03/19/2017, 2:52 PM

## 2017-03-19 NOTE — Telephone Encounter (Signed)
   [facility] Pre-operative Risk Assessment    Request for surgical clearance:  1. What type of surgery is being performed? EGD and colonoscopy   2. When is this surgery scheduled? 04/08/17   3. What type of clearance is required (medical clearance vs. Pharmacy clearance to hold med vs. Both)? both  4. Are there any medications that need to be held prior to surgery and how long?Plavix and ASA   5. Practice name and name of physician performing surgery? [facility] Dr. Collene Mares   6. What is your office phone and fax number? [phone]   7. Anesthesia type (None, local, MAC, general) ? propofol   Makana Feigel A Javoris Star 03/19/2017, 12:54 PM  _________________________________________________________________   (provider comments below)

## 2017-03-20 NOTE — Telephone Encounter (Signed)
Pt has been scheduled to see Kerin Ransom, PA-C, 04/01/17 for preop clearance.

## 2017-03-25 DIAGNOSIS — E7800 Pure hypercholesterolemia, unspecified: Secondary | ICD-10-CM | POA: Diagnosis not present

## 2017-03-25 DIAGNOSIS — I10 Essential (primary) hypertension: Secondary | ICD-10-CM | POA: Diagnosis not present

## 2017-03-25 DIAGNOSIS — E1165 Type 2 diabetes mellitus with hyperglycemia: Secondary | ICD-10-CM | POA: Diagnosis not present

## 2017-03-30 ENCOUNTER — Encounter: Payer: Self-pay | Admitting: Cardiology

## 2017-04-01 ENCOUNTER — Encounter: Payer: Self-pay | Admitting: Cardiology

## 2017-04-01 ENCOUNTER — Ambulatory Visit: Payer: Medicare HMO | Admitting: Cardiology

## 2017-04-01 VITALS — BP 148/72 | HR 79 | Ht 65.0 in | Wt 190.4 lb

## 2017-04-01 DIAGNOSIS — I739 Peripheral vascular disease, unspecified: Secondary | ICD-10-CM

## 2017-04-01 DIAGNOSIS — Z01810 Encounter for preprocedural cardiovascular examination: Secondary | ICD-10-CM | POA: Insufficient documentation

## 2017-04-01 DIAGNOSIS — E119 Type 2 diabetes mellitus without complications: Secondary | ICD-10-CM

## 2017-04-01 DIAGNOSIS — I10 Essential (primary) hypertension: Secondary | ICD-10-CM

## 2017-04-01 DIAGNOSIS — I6523 Occlusion and stenosis of bilateral carotid arteries: Secondary | ICD-10-CM

## 2017-04-01 DIAGNOSIS — E785 Hyperlipidemia, unspecified: Secondary | ICD-10-CM

## 2017-04-01 DIAGNOSIS — I4510 Unspecified right bundle-branch block: Secondary | ICD-10-CM

## 2017-04-01 NOTE — Assessment & Plan Note (Signed)
On high dose statin Rx- followed by PCP 

## 2017-04-01 NOTE — Assessment & Plan Note (Signed)
Carotid stenting 2015-due for f/u dopplers in June

## 2017-04-01 NOTE — Assessment & Plan Note (Signed)
Pt seen today as a pre op colonoscopy clearance (H/O colon cancer)

## 2017-04-01 NOTE — Progress Notes (Signed)
04/01/2017 William Burke   1941-01-03  [id number]  Primary Physician Richmond Campbell., PA-C Primary Cardiologist: Dr Allyson Sabal  HPI:  77 y/o male followed by Dr Allyson Sabal with a history of PVD, HTN, and DM. The pt had remote bilateral iliac stenting in 2009. In 2014 he had progression in his carotid disease and he underwent right internal carotid stenting.  In 2017 he had ISR of his RCIA stent. This was intervened on. 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 He has done well since. He has no history of CAD or MI. Myoview in 2009 and 2014 were low risk. He is to have a colonoscopy next week by Dr Loreta Ave and needs cardiac clearance.    Current Outpatient Medications  Medication Sig Dispense Refill  . aspirin EC 81 MG tablet Take 81 mg by mouth daily.    [name] atorvastatin (LIPITOR) 80 MG tablet Take 1 tablet (80 mg total) by mouth daily at 6 PM. 30 tablet 3  . clopidogrel (PLAVIX) 75 MG tablet Take 1 tablet (75 mg total) by mouth daily. 30 tablet 3  . diltiazem (TIAZAC) 240 MG 24 hr capsule Take 240 mg by mouth daily.     . ferrous sulfate 325 (65 FE) MG tablet Take 325 mg by mouth 2 (two) times daily with a meal.    . fluticasone (FLONASE) 50 MCG/ACT nasal spray Place 1 spray into both nostrils at bedtime as needed.     [name] glipiZIDE (GLUCOTROL) 10 MG tablet Take 2 tablets by mouth daily before breakfast.     . lisinopril-hydrochlorothiazide (PRINZIDE,ZESTORETIC) 20-12.5 MG tablet Take 1 tablet by mouth daily.  1  . metFORMIN (GLUCOPHAGE) 1000 MG tablet Take 1,000 mg by mouth 2 (two) times daily with a meal.    . omeprazole (PRILOSEC) 20 MG capsule Take 20 mg by mouth 2 (two) times daily.  1  . pioglitazone (ACTOS) 15 MG tablet Take 15 mg by mouth daily.     . sitaGLIPtin (JANUVIA) 100 MG tablet Take 50 mg by mouth daily.    [name] terazosin (HYTRIN) 10 MG capsule Take 10 mg by mouth at bedtime.     No current facility-administered medications for this  visit.     No Known Allergies  Past Medical History:  Diagnosis Date  . Anemia   . Arthritis    osteoarthritis knees, elbow right  . Carotid artery disease (HCC)    status post right carotid artery stenting 05/24/13  . Complication of anesthesia    sensitive to anesthesia ,longer to awaken-"doesn't require much"  . Complication of anesthesia    "can't sleep after anesthesia wears off and i become very anxious"   . Diabetes mellitus, type 2 (HCC)   . GERD (gastroesophageal reflux disease)    occ  . History of bronchitis   . History of pneumonia   . History of tobacco abuse   . Hyperlipidemia   . Hypertension   . Nocturia   . PAD (peripheral artery disease) (HCC) 06/22/2007   Bilateral iliac artery PTA and stenting with iCAST. Last lower extremity arterial Dopplers 02/25/2011 right ABI 1.0 left ABI 1.1.  Bilateral carotid artery stenosis. Last carotid Dopplers 07/18/2011: Max ICA stenosis 50-69% bilaterally  . UTI (lower urinary tract infection)   . Wears glasses     Social History   Socioeconomic History  . Marital status: Married    Spouse name: Not on file  . Number of  children: Not on file  . Years of education: Not on file  . Highest education level: Not on file  Social Needs  . Financial resource strain: Not on file  . Food insecurity - worry: Not on file  . Food insecurity - inability: Not on file  . Transportation needs - medical: Not on file  . Transportation needs - non-medical: Not on file  Occupational History  . Not on file  Tobacco Use  . Smoking status: Former Smoker    Packs/day: 3.00    Years: 25.00    Pack years: 75.00    Types: Cigarettes    Last attempt to quit: 08/06/1987    Years since quitting: 29.6  . Smokeless tobacco: Never Used  Substance and Sexual Activity  . Alcohol use: No  . Drug use: No  . Sexual activity: Not on file  Other Topics Concern  . Not on file  Social History Narrative  . Not on file     Family History  Problem  Relation Age of Onset  . Diabetes Mother   . Hypertension Mother   . Cerebral aneurysm Mother   . Multiple sclerosis Father   . Kidney disease Father      Review of Systems: General: negative for chills, fever, night sweats or weight changes.  Cardiovascular: negative for chest pain, dyspnea on exertion, edema, orthopnea, palpitations, paroxysmal nocturnal dyspnea or shortness of breath Dermatological: negative for rash Respiratory: negative for cough or wheezing Urologic: negative for hematuria Abdominal: negative for nausea, vomiting, diarrhea, bright red blood per rectum, melena, or hematemesis Neurologic: negative for visual changes, syncope, or dizziness All other systems reviewed and are otherwise negative except as noted above.    Blood pressure (!) 148/72, pulse 79, height 5\' 5"  (1.651 m), weight 190 lb 6.4 oz (86.4 kg), SpO2 94 %.  General appearance: alert, cooperative, no distress and mildly obese Neck: no JVD and bilateral carotid bruit Lungs: clear to auscultation bilaterally Heart: regular rate and rhythm Extremities: extremities normal, atraumatic, no cyanosis or edema Skin: Skin color, texture, turgor normal. No rashes or lesions Neurologic: Grossly normal  EKG NSR, RBBB  ASSESSMENT AND PLAN:   Pre-operative cardiovascular examination Pt seen today as a pre op colonoscopy clearance (H/O colon cancer)  Carotid artery disease (HCC) Carotid stenting 2015-due for f/u dopplers in June  PAD (peripheral artery disease) (HCC) Bilateral iliac artery PTA and stenting with iCAST 2009.  RICA stent 2015 ISR RCIA stent July 2017 treated with PTA, complicated by thrombotic occlusion of his Rt SFA requiring surgery Aug 2017  Essential hypertension Controlled- mild LVH- normal LVF by echo 2009  Non-insulin treated type 2 diabetes mellitus (HCC) Followed by PCP  Dyslipidemia On high dose statin Rx followed by PCP  RBBB Chronic   Plan:  Pt's chart reviewed and  pt interviewed and examined in the office today. s part of pre-operative protocol coverage. Given past medical history and exam today, based on ACC/AHA guidelines, KAIA DUDERSTADT would be at acceptable risk for the planned procedure without further cardiovascular testing. Dr Kenna Gilbert office has been contacted.   Please call with questions.  Corine Shelter, PA-C 04/01/2017, 1:21 PM

## 2017-04-01 NOTE — Assessment & Plan Note (Signed)
Controlled- mild LVH- normal LVF by echo 2009

## 2017-04-01 NOTE — Assessment & Plan Note (Signed)
Chronic. 

## 2017-04-01 NOTE — Assessment & Plan Note (Signed)
Followed by PCP

## 2017-04-01 NOTE — Assessment & Plan Note (Signed)
Bilateral iliac artery PTA and stenting with iCAST 2009.  RICA stent 2015 ISR RCIA stent July 2017 treated with PTA, complicated by thrombotic occlusion of his Rt SFA requiring surgery Aug 2017

## 2017-04-01 NOTE — Patient Instructions (Addendum)
Medication Instructions:  Your physician recommends that you continue on your current medications as directed. Please refer to the Current Medication list given to you today.  Labwork: None   Testing/Procedures: If possible schedule carotid doppler for June 2019  Follow-Up: Your physician wants you to follow-up VZ:CHYIFO 2019 with Dr Gwenlyn Found. You will receive a reminder letter in the mail two months in advance. If you don't receive a letter, please call our office to schedule the follow-up appointment. IF YOU HAVEN'T HEARD BACK BY June CONTACT OFFICE TO SET UP NEXT APPOINTMENT.  Any Other Special Instructions Will Be Listed Below (If Applicable). If you need a refill on your cardiac medications before your next appointment, please call your pharmacy.

## 2017-04-08 DIAGNOSIS — K317 Polyp of stomach and duodenum: Secondary | ICD-10-CM | POA: Diagnosis not present

## 2017-04-08 DIAGNOSIS — Z86010 Personal history of colonic polyps: Secondary | ICD-10-CM | POA: Diagnosis not present

## 2017-04-08 DIAGNOSIS — Z85038 Personal history of other malignant neoplasm of large intestine: Secondary | ICD-10-CM | POA: Diagnosis not present

## 2017-04-08 DIAGNOSIS — K2970 Gastritis, unspecified, without bleeding: Secondary | ICD-10-CM | POA: Diagnosis not present

## 2017-04-08 DIAGNOSIS — D509 Iron deficiency anemia, unspecified: Secondary | ICD-10-CM | POA: Diagnosis not present

## 2017-04-08 DIAGNOSIS — K635 Polyp of colon: Secondary | ICD-10-CM | POA: Diagnosis not present

## 2017-04-08 DIAGNOSIS — Z1211 Encounter for screening for malignant neoplasm of colon: Secondary | ICD-10-CM | POA: Diagnosis not present

## 2017-04-08 DIAGNOSIS — D123 Benign neoplasm of transverse colon: Secondary | ICD-10-CM | POA: Diagnosis not present

## 2017-04-22 DIAGNOSIS — N401 Enlarged prostate with lower urinary tract symptoms: Secondary | ICD-10-CM | POA: Diagnosis not present

## 2017-04-22 DIAGNOSIS — R3914 Feeling of incomplete bladder emptying: Secondary | ICD-10-CM | POA: Diagnosis not present

## 2017-04-22 DIAGNOSIS — R351 Nocturia: Secondary | ICD-10-CM | POA: Diagnosis not present

## 2017-06-17 DIAGNOSIS — R69 Illness, unspecified: Secondary | ICD-10-CM | POA: Diagnosis not present

## 2017-07-02 DIAGNOSIS — I779 Disorder of arteries and arterioles, unspecified: Secondary | ICD-10-CM | POA: Diagnosis not present

## 2017-07-02 DIAGNOSIS — I10 Essential (primary) hypertension: Secondary | ICD-10-CM | POA: Diagnosis not present

## 2017-07-02 DIAGNOSIS — E1165 Type 2 diabetes mellitus with hyperglycemia: Secondary | ICD-10-CM | POA: Diagnosis not present

## 2017-07-02 DIAGNOSIS — E7800 Pure hypercholesterolemia, unspecified: Secondary | ICD-10-CM | POA: Diagnosis not present

## 2017-07-13 ENCOUNTER — Ambulatory Visit (HOSPITAL_BASED_OUTPATIENT_CLINIC_OR_DEPARTMENT_OTHER)
Admission: RE | Admit: 2017-07-13 | Discharge: 2017-07-13 | Disposition: A | Discharge status: Home / Self Care | Payer: Medicare HMO | Source: Ambulatory Visit | Attending: Cardiovascular Disease | Admitting: Cardiovascular Disease

## 2017-07-13 ENCOUNTER — Ambulatory Visit (HOSPITAL_COMMUNITY)
Admission: RE | Admit: 2017-07-13 | Discharge: 2017-07-13 | Disposition: A | Discharge status: Home / Self Care | Payer: Medicare HMO | Source: Ambulatory Visit | Attending: Cardiovascular Disease | Admitting: Cardiovascular Disease

## 2017-07-13 DIAGNOSIS — E785 Hyperlipidemia, unspecified: Secondary | ICD-10-CM | POA: Diagnosis not present

## 2017-07-13 DIAGNOSIS — E119 Type 2 diabetes mellitus without complications: Secondary | ICD-10-CM | POA: Diagnosis not present

## 2017-07-13 DIAGNOSIS — I739 Peripheral vascular disease, unspecified: Secondary | ICD-10-CM

## 2017-07-13 DIAGNOSIS — I6523 Occlusion and stenosis of bilateral carotid arteries: Secondary | ICD-10-CM | POA: Diagnosis not present

## 2017-07-13 DIAGNOSIS — I10 Essential (primary) hypertension: Secondary | ICD-10-CM | POA: Diagnosis not present

## 2017-07-14 ENCOUNTER — Other Ambulatory Visit: Payer: Self-pay | Admitting: *Deleted

## 2017-07-14 DIAGNOSIS — I739 Peripheral vascular disease, unspecified: Secondary | ICD-10-CM

## 2017-07-14 DIAGNOSIS — I6523 Occlusion and stenosis of bilateral carotid arteries: Secondary | ICD-10-CM

## 2017-07-29 ENCOUNTER — Telehealth: Payer: Self-pay | Admitting: Cardiovascular Disease

## 2017-07-29 NOTE — Telephone Encounter (Signed)
Called the patient and LVM to call back and schedule OV in August with Dr. Gwenlyn Found.

## 2017-08-06 DIAGNOSIS — M2550 Pain in unspecified joint: Secondary | ICD-10-CM | POA: Diagnosis not present

## 2017-08-06 DIAGNOSIS — E7800 Pure hypercholesterolemia, unspecified: Secondary | ICD-10-CM | POA: Diagnosis not present

## 2017-08-06 DIAGNOSIS — Z0000 Encounter for general adult medical examination without abnormal findings: Secondary | ICD-10-CM | POA: Diagnosis not present

## 2017-08-06 DIAGNOSIS — R109 Unspecified abdominal pain: Secondary | ICD-10-CM | POA: Diagnosis not present

## 2017-08-06 DIAGNOSIS — I739 Peripheral vascular disease, unspecified: Secondary | ICD-10-CM | POA: Diagnosis not present

## 2017-08-06 DIAGNOSIS — E119 Type 2 diabetes mellitus without complications: Secondary | ICD-10-CM | POA: Diagnosis not present

## 2017-08-06 DIAGNOSIS — N400 Enlarged prostate without lower urinary tract symptoms: Secondary | ICD-10-CM | POA: Diagnosis not present

## 2017-08-06 DIAGNOSIS — W57XXXA Bitten or stung by nonvenomous insect and other nonvenomous arthropods, initial encounter: Secondary | ICD-10-CM | POA: Diagnosis not present

## 2017-08-06 DIAGNOSIS — K219 Gastro-esophageal reflux disease without esophagitis: Secondary | ICD-10-CM | POA: Diagnosis not present

## 2017-08-06 DIAGNOSIS — I10 Essential (primary) hypertension: Secondary | ICD-10-CM | POA: Diagnosis not present

## 2017-08-06 DIAGNOSIS — T148XXA Other injury of unspecified body region, initial encounter: Secondary | ICD-10-CM | POA: Diagnosis not present

## 2017-09-22 ENCOUNTER — Encounter: Payer: Self-pay | Admitting: Cardiovascular Disease

## 2017-09-22 ENCOUNTER — Ambulatory Visit: Payer: Medicare HMO | Admitting: Cardiovascular Disease

## 2017-09-22 DIAGNOSIS — I739 Peripheral vascular disease, unspecified: Secondary | ICD-10-CM

## 2017-09-22 DIAGNOSIS — E785 Hyperlipidemia, unspecified: Secondary | ICD-10-CM

## 2017-09-22 DIAGNOSIS — I6523 Occlusion and stenosis of bilateral carotid arteries: Secondary | ICD-10-CM

## 2017-09-22 DIAGNOSIS — I10 Essential (primary) hypertension: Secondary | ICD-10-CM

## 2017-09-22 DIAGNOSIS — Z87891 Personal history of nicotine dependence: Secondary | ICD-10-CM | POA: Diagnosis not present

## 2017-09-22 NOTE — Assessment & Plan Note (Signed)
History of PAD status post bilateral iliac PTA and stenting by myself 06/22/2007 using I-Cath covered stents for lifestyle limiting claudication.  I re-angiogram him 07/23/2015 for Dopplers that suggested restenosis revealing a 91% in-stent restenosis within the previously placed right common iliac artery stent which I restented with a Lifestream covered stent.  Unfortunately he developed a thrombotic occlusion of his distal right common femoral artery and proximal SFA and profunda branches requiring endarterectomy with patch angioplasty by Dr. Trula Slade 08/31/2015 with an excellent result.

## 2017-09-22 NOTE — Patient Instructions (Signed)
Your physician wants you to follow-up in: ONE YEAR WITH DR BERRY You will receive a reminder letter in the mail two months in advance. If you don't receive a letter, please call our office to schedule the follow-up appointment.   If you need a refill on your cardiac medications before your next appointment, please call your pharmacy.  

## 2017-09-22 NOTE — Assessment & Plan Note (Signed)
History of essential hypertension her blood pressure measured at 200/85.  Is on diltiazem, lisinopril and hydrochlorothiazide.  He does admit to dietary indiscretion.

## 2017-09-22 NOTE — Assessment & Plan Note (Signed)
History of hyperlipidemia on statin therapy. 

## 2017-09-22 NOTE — Assessment & Plan Note (Signed)
History of carotid artery disease status post right internal carotid artery stenting by myself and Dr. Trula Slade 05/16/2013 with follow-up Dopplers revealing this to be widely patent with moderate left ICA stenosis which will follow annually by duplex ultrasound.

## 2017-09-22 NOTE — Progress Notes (Signed)
09/22/2017 William Burke   [birth year]  [id number]  Primary Physician William Burke., PA-C Primary Cardiologist: William Gess MD FACP, [place], [place], [place]  HPI:  William Burke is a 77 y.o.  mildly to moderately overweight married Caucasian male father of 45, grandfather to 6 grandchildren who I saw  12/28/2015.[name] He has a history of bilateral iliac artery PTA and stenting by myself Jun 22, 2007 using iCAST covered stents for lifestyle-limiting claudication. This resulted in marked improvement in his symptoms on Dopplers. His other problems include non-insulin-requiring diabetes, hypertension and hyperlipidemia as well as remote tobacco abuse. He denies chest pain or shortness of breath. A Myoview performed in April of 2009 was nonischemic. An echo revealed normal LV systolic function with mild concentric LVH. He does have bilateral carotid disease which by duplex ultrasound performed this past June showed progression of disease on both sides. He is neurologically asymptomatic. Recent lower extremity Dopplers performed 06/24/12 showed his stents to be widely patent.carotid Dopplers performed on the same day showed significant progression of disease on the right side now with a hemodynamic significant stenosis. Most recent lab work performed earlier this month revealed a total cholesterol of 157, LDL of 89, and HDL of 38.  He had carotid Dopplers performed 08/06/12 that showed significant progression of disease on the right side now in the critical range. He does complain of some "tingling" in his right cheek. He is intolerant to Plavix because of epistaxis. Dr. Myra Burke and I performed right internal carotid artery stenting on William Burke 05/24/13 using distal embolic protection. The angiographic and clinical result excellent. Patient denies chest pain or shortness of breath but does complain of relatively new onset right lower extremity claudication. His ABI has fallen 2.65 on the right which is  significant compared to his prior Doppler 2015 when it was 0.95.He has had a left total knee replacement performed by Dr. Charlann Burke in December of last year. Because of progressive claudication and Dopplers that suggested a high-grade right internal iliac stenosis with a decline in his right ABI to .65. Angiogram him on 07/23/15 revealing 99% "in-stent restenosis within the previously placed right common iliac artery stent. I restented him with a Lifestream covered stent resulting in reduction of his stenosis to 30% residual. He did have 95% thrombotic occlusion of his distal right common femoral, proximal SFA and profunda branches. He underwent right common femoral, profunda and SFA endarterectomy and patch angioplasty by Dr. Myra Burke 08/31/15 with an excellent technical result. His wound has finally healed. His most recent Dopplers performed 07/13/2017 revealed normal ABIs bilaterally with a patent stent.  He denies claudication. I saw him almost 2 years ago he is remained stable.  He did stop his aspirin because of epistaxis and anemia.  He had a colonoscopy that was unrevealing.  His Dopplers are performed on an annual basis revealing stable ABIs and a widely patent carotid stent.   Current Meds  Medication Sig  . aspirin EC 81 MG tablet Take 81 mg by mouth daily.  [name] atorvastatin (LIPITOR) 80 MG tablet Take 1 tablet (80 mg total) by mouth daily at 6 PM.  . clopidogrel (PLAVIX) 75 MG tablet Take 1 tablet (75 mg total) by mouth daily.  [name] diltiazem (TIAZAC) 240 MG 24 hr capsule Take 240 mg by mouth daily.   . ferrous sulfate 325 (65 FE) MG tablet Take 325 mg by mouth 2 (two) times daily with a meal.  . fluticasone (FLONASE) 50 MCG/ACT  nasal spray Place 1 spray into both nostrils at bedtime as needed.   [name] glipiZIDE (GLUCOTROL) 10 MG tablet Take 2 tablets by mouth daily before breakfast.   . lisinopril-hydrochlorothiazide (PRINZIDE,ZESTORETIC) 20-12.5 MG tablet Take 1 tablet by mouth daily.  . metFORMIN  (GLUCOPHAGE) 1000 MG tablet Take 1,000 mg by mouth 2 (two) times daily with a meal.  . omeprazole (PRILOSEC) 20 MG capsule Take 20 mg by mouth 2 (two) times daily.  . pioglitazone (ACTOS) 30 MG tablet Take 30 mg by mouth daily.   . sitaGLIPtin (JANUVIA) 100 MG tablet Take 50 mg by mouth daily.  [name] terazosin (HYTRIN) 10 MG capsule Take 10 mg by mouth at bedtime.     No Known Allergies  Social History   Socioeconomic History  . Marital status: Married    Spouse name: Not on file  . Number of children: Not on file  . Years of education: Not on file  . Highest education level: Not on file  Occupational History  . Not on file  Social Needs  . Financial resource strain: Not on file  . Food insecurity:    Worry: Not on file    Inability: Not on file  . Transportation needs:    Medical: Not on file    Non-medical: Not on file  Tobacco Use  . Smoking status: Former Smoker    Packs/day: 3.00    Years: 25.00    Pack years: 75.00    Types: Cigarettes    Last attempt to quit: 08/06/1987    Years since quitting: 30.1  . Smokeless tobacco: Never Used  Substance and Sexual Activity  . Alcohol use: No  . Drug use: No  . Sexual activity: Not on file  Lifestyle  . Physical activity:    Days per week: Not on file    Minutes per session: Not on file  . Stress: Not on file  Relationships  . Social connections:    Talks on phone: Not on file    Gets together: Not on file    Attends religious service: Not on file    Active member of club or organization: Not on file    Attends meetings of clubs or organizations: Not on file    Relationship status: Not on file  . Intimate partner violence:    Fear of current or ex partner: Not on file    Emotionally abused: Not on file    Physically abused: Not on file    Forced sexual activity: Not on file  Other Topics Concern  . Not on file  Social History Narrative  . Not on file     Review of Systems: General: negative for chills, fever,  night sweats or weight changes.  Cardiovascular: negative for chest pain, dyspnea on exertion, edema, orthopnea, palpitations, paroxysmal nocturnal dyspnea or shortness of breath Dermatological: negative for rash Respiratory: negative for cough or wheezing Urologic: negative for hematuria Abdominal: negative for nausea, vomiting, diarrhea, bright red blood per rectum, melena, or hematemesis Neurologic: negative for visual changes, syncope, or dizziness All other systems reviewed and are otherwise negative except as noted above.    Blood pressure (!) 200/85, pulse 78, height 5\' 5"  (1.651 m), weight 199 lb 12.8 oz (90.6 kg), SpO2 94 %.  General appearance: alert and no distress Neck: no adenopathy, no JVD, supple, symmetrical, trachea midline, thyroid not enlarged, symmetric, no tenderness/mass/nodules and Bilateral carotid bruits Lungs: clear to auscultation bilaterally Heart: regular rate and rhythm, S1, S2 normal,  no murmur, click, rub or gallop Extremities: extremities normal, atraumatic, no cyanosis or edema Pulses: 2+ and symmetric Skin: Skin color, texture, turgor normal. No rashes or lesions Neurologic: Alert and oriented X 3, normal strength and tone. Normal symmetric reflexes. Normal coordination and gait  EKG not performed today  ASSESSMENT AND PLAN:   History of tobacco abuse Discontinued remotely  PAD (peripheral artery disease) (HCC) History of PAD status post bilateral iliac PTA and stenting by myself 06/22/2007 using I-Cath covered stents for lifestyle limiting claudication.  I re-angiogram him 07/23/2015 for Dopplers that suggested restenosis revealing a 91% in-stent restenosis within the previously placed right common iliac artery stent which I restented with a Lifestream covered stent.  Unfortunately he developed a thrombotic occlusion of his distal right common femoral artery and proximal SFA and profunda branches requiring endarterectomy with patch angioplasty by Dr.  Myra Burke 08/31/2015 with an excellent result.  Essential hypertension History of essential hypertension her blood pressure measured at 200/85.  Is on diltiazem, lisinopril and hydrochlorothiazide.  He does admit to dietary indiscretion.  Dyslipidemia History of hyperlipidemia on statin therapy  Carotid artery disease (HCC) History of carotid artery disease status post right internal carotid artery stenting by myself and Dr. Myra Burke 05/16/2013 with follow-up Dopplers revealing this to be widely patent with moderate left ICA stenosis which will follow annually by duplex ultrasound.      William Gess MD FACP,FACC,FAHA, [facility] 09/22/2017 9:57 AM

## 2017-09-22 NOTE — Assessment & Plan Note (Signed)
Discontinued remotely

## 2017-10-20 ENCOUNTER — Other Ambulatory Visit: Payer: Self-pay | Admitting: *Deleted

## 2017-10-20 DIAGNOSIS — I739 Peripheral vascular disease, unspecified: Secondary | ICD-10-CM

## 2017-10-20 DIAGNOSIS — R69 Illness, unspecified: Secondary | ICD-10-CM | POA: Diagnosis not present

## 2017-10-21 DIAGNOSIS — Z23 Encounter for immunization: Secondary | ICD-10-CM | POA: Diagnosis not present

## 2017-11-10 DIAGNOSIS — I10 Essential (primary) hypertension: Secondary | ICD-10-CM | POA: Diagnosis not present

## 2017-11-10 DIAGNOSIS — E119 Type 2 diabetes mellitus without complications: Secondary | ICD-10-CM | POA: Diagnosis not present

## 2017-11-27 DIAGNOSIS — R69 Illness, unspecified: Secondary | ICD-10-CM | POA: Diagnosis not present

## 2017-12-16 DIAGNOSIS — R040 Epistaxis: Secondary | ICD-10-CM | POA: Diagnosis not present

## 2018-01-25 DIAGNOSIS — R351 Nocturia: Secondary | ICD-10-CM | POA: Diagnosis not present

## 2018-01-25 DIAGNOSIS — R350 Frequency of micturition: Secondary | ICD-10-CM | POA: Diagnosis not present

## 2018-01-25 DIAGNOSIS — N401 Enlarged prostate with lower urinary tract symptoms: Secondary | ICD-10-CM | POA: Diagnosis not present

## 2018-01-25 DIAGNOSIS — N5203 Combined arterial insufficiency and corporo-venous occlusive erectile dysfunction: Secondary | ICD-10-CM | POA: Diagnosis not present

## 2018-01-25 DIAGNOSIS — R3911 Hesitancy of micturition: Secondary | ICD-10-CM | POA: Diagnosis not present

## 2018-01-25 DIAGNOSIS — N4889 Other specified disorders of penis: Secondary | ICD-10-CM | POA: Diagnosis not present

## 2018-07-14 ENCOUNTER — Other Ambulatory Visit: Payer: Self-pay | Admitting: Cardiovascular Disease

## 2018-07-14 DIAGNOSIS — Z95820 Peripheral vascular angioplasty status with implants and grafts: Secondary | ICD-10-CM

## 2018-07-14 DIAGNOSIS — I739 Peripheral vascular disease, unspecified: Secondary | ICD-10-CM

## 2018-07-20 ENCOUNTER — Ambulatory Visit (HOSPITAL_COMMUNITY)
Admission: RE | Admit: 2018-07-20 | Discharge: 2018-07-20 | Disposition: A | Discharge status: Home / Self Care | Payer: Medicare HMO | Source: Ambulatory Visit | Attending: Cardiology | Admitting: Cardiology

## 2018-07-20 ENCOUNTER — Other Ambulatory Visit (HOSPITAL_COMMUNITY): Payer: Self-pay | Admitting: Cardiovascular Disease

## 2018-07-20 ENCOUNTER — Other Ambulatory Visit: Payer: Self-pay

## 2018-07-20 ENCOUNTER — Encounter: Payer: Self-pay | Admitting: *Deleted

## 2018-07-20 ENCOUNTER — Ambulatory Visit (HOSPITAL_BASED_OUTPATIENT_CLINIC_OR_DEPARTMENT_OTHER)
Admission: RE | Admit: 2018-07-20 | Discharge: 2018-07-20 | Disposition: A | Discharge status: Home / Self Care | Payer: Medicare HMO | Source: Ambulatory Visit | Attending: Cardiovascular Disease | Admitting: Cardiovascular Disease

## 2018-07-20 ENCOUNTER — Other Ambulatory Visit: Payer: Self-pay | Admitting: Cardiovascular Disease

## 2018-07-20 ENCOUNTER — Encounter (HOSPITAL_COMMUNITY): Payer: Self-pay

## 2018-07-20 DIAGNOSIS — Z95828 Presence of other vascular implants and grafts: Secondary | ICD-10-CM | POA: Insufficient documentation

## 2018-07-20 DIAGNOSIS — I739 Peripheral vascular disease, unspecified: Secondary | ICD-10-CM

## 2018-07-20 DIAGNOSIS — Z95820 Peripheral vascular angioplasty status with implants and grafts: Secondary | ICD-10-CM | POA: Diagnosis not present

## 2018-07-20 DIAGNOSIS — Z9889 Other specified postprocedural states: Secondary | ICD-10-CM

## 2018-07-20 DIAGNOSIS — I6523 Occlusion and stenosis of bilateral carotid arteries: Secondary | ICD-10-CM

## 2018-07-20 DIAGNOSIS — Z9862 Peripheral vascular angioplasty status: Secondary | ICD-10-CM

## 2018-07-22 ENCOUNTER — Other Ambulatory Visit: Payer: Self-pay

## 2018-07-22 DIAGNOSIS — I739 Peripheral vascular disease, unspecified: Secondary | ICD-10-CM

## 2018-07-22 NOTE — Progress Notes (Signed)
Notes recorded by Lorretta Harp, MD on 07/20/2018 at 12:00 PM EDT  No change from prior study. Repeat in 12 months.  History of PAD.

## 2018-07-22 NOTE — Progress Notes (Signed)
Notes recorded by Lorretta Harp, MD on 07/20/2018 at 11:59 AM EDT  No change from prior study. Repeat in 12 months.  Bilateral carotid artery stenosis with history of RICA stent

## 2018-07-22 NOTE — Progress Notes (Signed)
Notes recorded by Lorretta Harp, MD on 07/20/2018 at 12:00 PM EDT  No change from prior study. Repeat in 12 months.  History of PAD

## 2018-12-03 ENCOUNTER — Other Ambulatory Visit: Payer: Self-pay

## 2018-12-03 ENCOUNTER — Ambulatory Visit: Payer: Medicare HMO | Admitting: Cardiovascular Disease

## 2018-12-03 ENCOUNTER — Encounter: Payer: Self-pay | Admitting: Cardiovascular Disease

## 2018-12-03 VITALS — BP 144/62 | HR 81 | Temp 97.2°F | Ht 65.0 in | Wt 195.0 lb

## 2018-12-03 DIAGNOSIS — I4510 Unspecified right bundle-branch block: Secondary | ICD-10-CM

## 2018-12-03 DIAGNOSIS — I10 Essential (primary) hypertension: Secondary | ICD-10-CM

## 2018-12-03 DIAGNOSIS — I6523 Occlusion and stenosis of bilateral carotid arteries: Secondary | ICD-10-CM | POA: Diagnosis not present

## 2018-12-03 DIAGNOSIS — I739 Peripheral vascular disease, unspecified: Secondary | ICD-10-CM

## 2018-12-03 DIAGNOSIS — E785 Hyperlipidemia, unspecified: Secondary | ICD-10-CM | POA: Diagnosis not present

## 2018-12-03 LAB — HEPATIC FUNCTION PANEL
ALT: 25 IU/L (ref 0–44)
AST: 20 IU/L (ref 0–40)
Albumin: 4.4 g/dL (ref 3.7–4.7)
Alkaline Phosphatase: 53 IU/L (ref 39–117)
Bilirubin Total: 0.4 mg/dL (ref 0.0–1.2)
Bilirubin, Direct: 0.17 mg/dL (ref 0.00–0.40)
Total Protein: 7 g/dL (ref 6.0–8.5)

## 2018-12-03 LAB — LIPID PANEL
Chol/HDL Ratio: 2.8 ratio (ref 0.0–5.0)
Cholesterol, Total: 119 mg/dL (ref 100–199)
HDL: 43 mg/dL (ref >39)
LDL Chol Calc (NIH): 53 mg/dL (ref 0–99)
Triglycerides: 133 mg/dL (ref 0–149)
VLDL Cholesterol Cal: 23 mg/dL (ref 5–40)

## 2018-12-03 NOTE — Assessment & Plan Note (Signed)
Remote history of tobacco use having quit 25 years ago.

## 2018-12-03 NOTE — Assessment & Plan Note (Signed)
History of essential hypertension with blood pressure measured at 144/62.  He is on diltiazem, lisinopril and hydrochlorothiazide.

## 2018-12-03 NOTE — Patient Instructions (Signed)
Medication Instructions:  No changes *If you need a refill on your cardiac medications before your next appointment, please call your pharmacy*  Lab Work: Your provider would like for you to have the following labs today: Lipid and Liver If you have labs (blood work) drawn today and your tests are completely normal, you will receive your results only by: [name] MyChart Message (if you have MyChart) OR . A paper copy in the mail If you have any lab test that is abnormal or we need to change your treatment, we will call you to review the results.  Testing/Procedures: Your physician has requested that you have a lower extremity arterial duplex in one year. During this test, ultrasound is used to evaluate arterial blood flow in the legs. Allow one hour for this exam. There are no restrictions or special instructions. This will take place at [place], [address].  Your physician has requested that you have an ankle brachial index (ABI) in one year. During this test an ultrasound and blood pressure cuff are used to evaluate the arteries that supply the arms and legs with blood. Allow thirty minutes for this exam. There are no restrictions or special instructions. This will take place at [place], [address].  Your physician has requested that you have an Aorta/Iliac Duplex in one year. This will be take place at [place], [address].    No food after 11PM the night before.  Water is OK. (Don't drink liquids if you have been instructed not to for ANOTHER test).  Take two Extra-Strength Gas-X capsules at bedtime the night before test.   Take an additional two Extra-Strength Gas-X capsules three (3) hours before the test or first thing in the morning.    Avoid foods that produce bowel gas, for 24 hours prior to exam (see below).    No breakfast, no chewing gum, no smoking or carbonated beverages.  Patient may take morning medications with water.  Come in for test at least  15 minutes early to register.   Your physician has requested that you have a carotid duplex in one year. This test is an ultrasound of the carotid arteries in your neck. It looks at blood flow through these arteries that supply the brain with blood. Allow one hour for this exam. There are no restrictions or special instructions. This will take place at [place], [address].  Follow-Up: At [facility], you and your health needs are our priority.  As part of our continuing mission to provide you with exceptional heart care, we have created designated Provider Care Teams.  These Care Teams include your primary Cardiologist (physician) and Advanced Practice Providers (APPs -  Physician Assistants and Nurse Practitioners) who all work together to provide you with the care you need, when you need it.  Your next appointment:   12 months  The format for your next appointment:   In Person  Provider:   [name], MD

## 2018-12-03 NOTE — Assessment & Plan Note (Signed)
History of hyperlipidemia on statin therapy with no current lipid profile.  We will we will get a fasting lipid liver profile today

## 2018-12-03 NOTE — Assessment & Plan Note (Signed)
History of carotid artery stenting by myself and Dr. Trula Slade 05/16/2013 with Doppler studies performed 07/20/2018 revealing his stent to be widely patent and moderate left ICA stenosis.  This will be repeated on an annual basis.

## 2018-12-03 NOTE — Progress Notes (Signed)
12/03/2018 William Burke   07/22/1940  [id number]  Primary Physician William Burke., PA-C Primary Cardiologist: William Gess MD FACP, [place], [place], [place]  HPI:  William Burke is a 78 y.o.  mildly to moderately overweight married Caucasian male father of 66, grandfather to 6 grandchildren who I saw  09/22/2017.[name] He has a history of bilateral iliac artery PTA and stenting by myself Jun 22, 2007 using iCAST covered stents for lifestyle-limiting claudication. This resulted in marked improvement in his symptoms on Dopplers. His other problems include non-insulin-requiring diabetes, hypertension and hyperlipidemia as well as remote tobacco abuse. He denies chest pain or shortness of breath. A Myoview performed in April of 2009 was nonischemic. An echo revealed normal LV systolic function with mild concentric LVH. He does have bilateral carotid disease which by duplex ultrasound performed this past June showed progression of disease on both sides. He is neurologically asymptomatic. Recent lower extremity Dopplers performed 06/24/12 showed his stents to be widely patent.carotid Dopplers performed on the same day showed significant progression of disease on the right side now with a hemodynamic significant stenosis. Most recent lab work performed earlier this month revealed a total cholesterol of 157, LDL of 89, and HDL of 38.  He had carotid Dopplers performed 08/06/12 that showed significant progression of disease on the right side now in the critical range. He does complain of some "tingling" in his right cheek. He is intolerant to Plavix because of epistaxis. Dr. Myra Burke and I performed right internal carotid artery stenting on Mr. William Burke 05/24/13 using distal embolic protection. The angiographic and clinical result excellent. Patient denies chest pain or shortness of breath but does complain of relatively new onset right lower extremity claudication. His ABI has fallen 2.65 on the right which is  significant compared to his prior Doppler 2015 when it was 0.95.He has had a left total knee replacement performed by Dr. Charlann Burke in December of last year. Because of progressive claudication and Dopplers that suggested a high-grade right internal iliac stenosis with a decline in his right ABI to .65. Angiogram him on 07/23/15 revealing 99% "in-stent restenosis within the previously placed right common iliac artery stent. I restented him with a Lifestream covered stent resulting in reduction of his stenosis to 30% residual. He did have 95% thrombotic occlusion of his distal right common femoral, proximal SFA and profunda branches.He underwent right common femoral, profunda and SFA endarterectomy and patch angioplasty by Dr. Myra Burke 08/31/15 with an excellent technical result. His wound has finally healed. His most recent Dopplers performed 07/13/2017 revealed normal ABIs bilaterally with a patent stent.  He denies claudication.   Since I saw him a year ago he is continue to do well.  He mows his yard and does work around the house without symptoms.  He denies chest pain, shortness of breath or claudication.  His carotid Dopplers recently performed in June revealed a widely patent right carotid stent with moderate left ICA stenosis and his lower extremity arterial Doppler studies revealed widely patent iliac stents with normal ABIs bilaterally.   Current Meds  Medication Sig  . aspirin EC 81 MG tablet Take 81 mg by mouth daily.  [name] atorvastatin (LIPITOR) 80 MG tablet Take 1 tablet (80 mg total) by mouth daily at 6 PM.  . clopidogrel (PLAVIX) 75 MG tablet Take 1 tablet (75 mg total) by mouth daily.  [name] diltiazem (TIAZAC) 240 MG 24 hr capsule Take 240 mg by mouth daily.   [name]  ferrous sulfate 325 (65 FE) MG tablet Take 325 mg by mouth 2 (two) times daily with a meal.  . fluticasone (FLONASE) 50 MCG/ACT nasal spray Place 1 spray into both nostrils at bedtime as needed.   [name] glipiZIDE (GLUCOTROL) 10 MG tablet Take 2  tablets by mouth daily before breakfast.   . lisinopril-hydrochlorothiazide (PRINZIDE,ZESTORETIC) 20-12.5 MG tablet Take 1 tablet by mouth daily.  . metFORMIN (GLUCOPHAGE) 1000 MG tablet Take 1,000 mg by mouth 2 (two) times daily with a meal.  . omeprazole (PRILOSEC) 20 MG capsule Take 20 mg by mouth 2 (two) times daily.  . pioglitazone (ACTOS) 30 MG tablet Take 30 mg by mouth daily.   . sitaGLIPtin (JANUVIA) 100 MG tablet Take 50 mg by mouth daily.  [name] terazosin (HYTRIN) 10 MG capsule Take 10 mg by mouth at bedtime.     No Known Allergies  Social History   Socioeconomic History  . Marital status: Married    Spouse name: Not on file  . Number of children: Not on file  . Years of education: Not on file  . Highest education level: Not on file  Occupational History  . Not on file  Social Needs  . Financial resource strain: Not on file  . Food insecurity    Worry: Not on file    Inability: Not on file  . Transportation needs    Medical: Not on file    Non-medical: Not on file  Tobacco Use  . Smoking status: Former Smoker    Packs/day: 3.00    Years: 25.00    Pack years: 75.00    Types: Cigarettes    Quit date: 08/06/1987    Years since quitting: 31.3  . Smokeless tobacco: Never Used  Substance and Sexual Activity  . Alcohol use: No  . Drug use: No  . Sexual activity: Not on file  Lifestyle  . Physical activity    Days per week: Not on file    Minutes per session: Not on file  . Stress: Not on file  Relationships  . Social Musician on phone: Not on file    Gets together: Not on file    Attends religious service: Not on file    Active member of club or organization: Not on file    Attends meetings of clubs or organizations: Not on file    Relationship status: Not on file  . Intimate partner violence    Fear of current or ex partner: Not on file    Emotionally abused: Not on file    Physically abused: Not on file    Forced sexual activity: Not on file   Other Topics Concern  . Not on file  Social History Narrative  . Not on file     Review of Systems: General: negative for chills, fever, night sweats or weight changes.  Cardiovascular: negative for chest pain, dyspnea on exertion, edema, orthopnea, palpitations, paroxysmal nocturnal dyspnea or shortness of breath Dermatological: negative for rash Respiratory: negative for cough or wheezing Urologic: negative for hematuria Abdominal: negative for nausea, vomiting, diarrhea, bright red blood per rectum, melena, or hematemesis Neurologic: negative for visual changes, syncope, or dizziness All other systems reviewed and are otherwise negative except as noted above.    Blood pressure (!) 144/62, pulse 81, temperature (!) 97.2 F (36.2 C), height 5\' 5"  (1.651 m), weight 195 lb (88.5 kg).  General appearance: alert and no distress Neck: no adenopathy, no JVD, supple, symmetrical, trachea  midline, thyroid not enlarged, symmetric, no tenderness/mass/nodules and Left carotid bruit Lungs: clear to auscultation bilaterally Heart: regular rate and rhythm, S1, S2 normal, no murmur, click, rub or gallop Extremities: extremities normal, atraumatic, no cyanosis or edema Pulses: 2+ and symmetric Skin: Skin color, texture, turgor normal. No rashes or lesions Neurologic: Alert and oriented X 3, normal strength and tone. Normal symmetric reflexes. Normal coordination and gait  EKG sinus rhythm 81 with right bundle branch block and inferior Q waves with early R wave transition.  I personally reviewed this EKG.  ASSESSMENT AND PLAN:   History of tobacco abuse Remote history of tobacco use having quit 25 years ago.  PAD (peripheral artery disease) (HCC) History of PAD status post bilateral iliac stenting by myself using I cast covered stents 06/22/2007 for lifestyle limiting claudication.  Because of recurrent claudication and worsening Dopplers I restudied him 07/23/2015 revealing a 99% "in-stent  restenosis within the previously placed right common iliac artery stent with 30 to 40% left iliac in-stent restenosis.  I restented him with a Lifestream covered stent resulting in marked improvement.  He did have severe thrombotic distal common femoral, profunda and proximal SFA thrombotic occlusion and underwent endarterectomy and patch angioplasty by Dr. Myra Burke 08/31/2015 with an excellent result.  He currently denies claudication.  His most recent lower extremity arterial Doppler studies performed 07/20/2018 revealed normal ABIs, mild elevation in his left common iliac artery velocities and normal velocities in the right SFA.  We will repeat this on annual basis.  Essential hypertension History of essential hypertension with blood pressure measured at 144/62.  He is on diltiazem, lisinopril and hydrochlorothiazide.  Dyslipidemia History of hyperlipidemia on statin therapy with no current lipid profile.  We will we will get a fasting lipid liver profile today  Carotid artery disease (HCC) History of carotid artery stenting by myself and Dr. Myra Burke 05/16/2013 with Doppler studies performed 07/20/2018 revealing his stent to be widely patent and moderate left ICA stenosis.  This will be repeated on an annual basis.  RBBB Chronic      William Gess MD FACP,FACC,FAHA, [facility] 12/03/2018 9:08 AM

## 2018-12-03 NOTE — Assessment & Plan Note (Signed)
Chronic. 

## 2018-12-03 NOTE — Assessment & Plan Note (Signed)
History of PAD status post bilateral iliac stenting by myself using I cast covered stents 06/22/2007 for lifestyle limiting claudication.  Because of recurrent claudication and worsening Dopplers I restudied him 07/23/2015 revealing a 99% "in-stent restenosis within the previously placed right common iliac artery stent with 30 to 40% left iliac in-stent restenosis.  I restented him with a Lifestream covered stent resulting in marked improvement.  He did have severe thrombotic distal common femoral, profunda and proximal SFA thrombotic occlusion and underwent endarterectomy and patch angioplasty by Dr. Trula Slade 08/31/2015 with an excellent result.  He currently denies claudication.  His most recent lower extremity arterial Doppler studies performed 07/20/2018 revealed normal ABIs, mild elevation in his left common iliac artery velocities and normal velocities in the right SFA.  We will repeat this on annual basis.

## 2019-04-26 ENCOUNTER — Encounter: Payer: Self-pay | Admitting: Neurology

## 2019-05-25 ENCOUNTER — Ambulatory Visit: Payer: Medicare HMO | Admitting: Neurology

## 2019-07-21 ENCOUNTER — Other Ambulatory Visit (HOSPITAL_COMMUNITY): Payer: Self-pay | Admitting: Cardiovascular Disease

## 2019-07-21 ENCOUNTER — Ambulatory Visit (HOSPITAL_COMMUNITY)
Admission: RE | Admit: 2019-07-21 | Discharge: 2019-07-21 | Disposition: A | Discharge status: Home / Self Care | Payer: Medicare HMO | Source: Ambulatory Visit | Attending: Internal Medicine | Admitting: Internal Medicine

## 2019-07-21 ENCOUNTER — Other Ambulatory Visit: Payer: Self-pay

## 2019-07-21 ENCOUNTER — Ambulatory Visit (HOSPITAL_BASED_OUTPATIENT_CLINIC_OR_DEPARTMENT_OTHER)
Admission: RE | Admit: 2019-07-21 | Discharge: 2019-07-21 | Disposition: A | Discharge status: Home / Self Care | Payer: Medicare HMO | Source: Ambulatory Visit | Attending: Cardiovascular Disease | Admitting: Cardiovascular Disease

## 2019-07-21 DIAGNOSIS — I739 Peripheral vascular disease, unspecified: Secondary | ICD-10-CM | POA: Insufficient documentation

## 2019-07-21 DIAGNOSIS — Z95828 Presence of other vascular implants and grafts: Secondary | ICD-10-CM | POA: Insufficient documentation

## 2019-07-21 DIAGNOSIS — I6523 Occlusion and stenosis of bilateral carotid arteries: Secondary | ICD-10-CM

## 2019-07-21 DIAGNOSIS — Z95820 Peripheral vascular angioplasty status with implants and grafts: Secondary | ICD-10-CM

## 2019-07-21 DIAGNOSIS — Z9862 Peripheral vascular angioplasty status: Secondary | ICD-10-CM

## 2019-07-21 DIAGNOSIS — Z9889 Other specified postprocedural states: Secondary | ICD-10-CM | POA: Diagnosis not present

## 2019-08-02 ENCOUNTER — Ambulatory Visit: Payer: Medicare HMO | Admitting: Neurology

## 2020-01-03 ENCOUNTER — Encounter: Payer: Self-pay | Admitting: Cardiovascular Disease

## 2020-01-03 ENCOUNTER — Other Ambulatory Visit: Payer: Self-pay

## 2020-01-03 ENCOUNTER — Ambulatory Visit: Payer: Medicare HMO | Admitting: Cardiovascular Disease

## 2020-01-03 DIAGNOSIS — I10 Essential (primary) hypertension: Secondary | ICD-10-CM

## 2020-01-03 DIAGNOSIS — I739 Peripheral vascular disease, unspecified: Secondary | ICD-10-CM | POA: Diagnosis not present

## 2020-01-03 DIAGNOSIS — E785 Hyperlipidemia, unspecified: Secondary | ICD-10-CM | POA: Diagnosis not present

## 2020-01-03 DIAGNOSIS — I6523 Occlusion and stenosis of bilateral carotid arteries: Secondary | ICD-10-CM | POA: Diagnosis not present

## 2020-01-03 NOTE — Assessment & Plan Note (Signed)
History of carotid artery disease status post right internal carotid stenting by myself and Dr. Trula Slade 05/24/2013.  His most recent carotid Dopplers performed 07/21/2019 revealing a widely patent right carotid stent with moderate left ICA stenosis.  This will be repeated on annual basis.

## 2020-01-03 NOTE — Progress Notes (Signed)
01/03/2020 William Burke   December 20, 1940  [id number]  Primary Physician William Burke., PA-C Primary Cardiologist: William Gess MD FACP, [place], [place], [place]  HPI:  William Burke is a 79 y.o.  mildly to moderately overweight married Caucasian male father of 80, grandfather to 6 grandchildren who I saw 12/03/2018... He has a history of bilateral iliac artery PTA and stenting by myself Jun 22, 2007 using iCAST covered stents for lifestyle-limiting claudication. This resulted in marked improvement in his symptoms on Dopplers. His other problems include non-insulin-requiring diabetes, hypertension and hyperlipidemia as well as remote tobacco abuse. He denies chest pain or shortness of breath. A Myoview performed in April of 2009 was nonischemic. An echo revealed normal LV systolic function with mild concentric LVH. He does have bilateral carotid disease which by duplex ultrasound performed this past June showed progression of disease on both sides. He is neurologically asymptomatic. Recent lower extremity Dopplers performed 06/24/12 showed his stents to be widely patent.carotid Dopplers performed on the same day showed significant progression of disease on the right side now with a hemodynamic significant stenosis. Most recent lab work performed earlier this month revealed a total cholesterol of 157, LDL of 89, and HDL of 38.  He had carotid Dopplers performed 08/06/12 that showed significant progression of disease on the right side now in the critical range. He does complain of some "tingling" in his right cheek. He is intolerant to Plavix because of epistaxis. Dr. Myra Burke and I performed right internal carotid artery stenting on Mr. William Burke 05/24/13 using distal embolic protection. The angiographic and clinical result excellent. Patient denies chest pain or shortness of breath but does complain of relatively new onset right lower extremity claudication. His ABI has fallen 2.65 on the right which is  significant compared to his prior Doppler 2015 when it was 0.95.He has had a left total knee replacement performed by Dr. Charlann Burke in December of last year. Because of progressive claudication and Dopplers that suggested a high-grade right internal iliac stenosis with a decline in his right ABI to .65. Angiogram him on 07/23/15 revealing 99% "in-stent restenosis within the previously placed right common iliac artery stent. I restented him with a Lifestream covered stent resulting in reduction of his stenosis to 30% residual. He did have 95% thrombotic occlusion of his distal right common femoral, proximal SFA and profunda branches.He underwent right common femoral, profunda and SFA endarterectomy and patch angioplasty by Dr. Myra Burke 08/31/15 with an excellent technical result. His wound has finally healed. His most recent Dopplers performed6/17/2019revealed normal ABIs bilaterally with a patent stent. He denies claudication.  Since I saw him a year ago he continues to deny chest pain, shortness of breath or claudication.  His carotid Dopplers revealed a widely patent right carotid stent with moderate left ICA stenosis.  Lower extremity arterial Doppler studies reveal widely patent right iliac stent with moderate left iliac artery in-stent restenosis.  His lipid profile from a year ago is excellent.  He is starting to feel some fatigue.   Current Meds  Medication Sig  . aspirin EC 81 MG tablet Take 81 mg by mouth daily.  [name] atorvastatin (LIPITOR) 80 MG tablet Take 1 tablet (80 mg total) by mouth daily at 6 PM.  . clopidogrel (PLAVIX) 75 MG tablet Take 1 tablet (75 mg total) by mouth daily.  [name] diltiazem (TIAZAC) 240 MG 24 hr capsule Take 240 mg by mouth daily.   . ferrous sulfate 325 (65 FE) MG  tablet Take 325 mg by mouth 2 (two) times daily with a meal.  . fluticasone (FLONASE) 50 MCG/ACT nasal spray Place 1 spray into both nostrils at bedtime as needed.   . furosemide (LASIX) 20 MG tablet Take 1 tablet  by mouth daily.  [name] glipiZIDE (GLUCOTROL) 10 MG tablet Take 2 tablets by mouth daily before breakfast.   . liraglutide (VICTOZA) 18 MG/3ML SOPN 0.6 MG DAILY X 1 WEEK. 1.2 MG DAILY X 1 WEEK. 1.8 MG DAILY X 1 WEEK. 2.4 MG DAILY X 1 WEEK. 3.0 MG DAILY THEREAFTER  . lisinopril-hydrochlorothiazide (PRINZIDE,ZESTORETIC) 20-12.5 MG tablet Take 1 tablet by mouth daily.  . metFORMIN (GLUCOPHAGE) 1000 MG tablet Take 1,000 mg by mouth 2 (two) times daily with a meal.  . niacin 500 MG tablet Take by mouth.  [name] omeprazole (PRILOSEC) 20 MG capsule Take 20 mg by mouth 2 (two) times daily.  . pioglitazone (ACTOS) 30 MG tablet Take 30 mg by mouth daily.   . sitaGLIPtin (JANUVIA) 100 MG tablet Take 50 mg by mouth daily.  . tamsulosin (FLOMAX) 0.4 MG CAPS capsule Take 1 tablet by mouth daily.  [name] terazosin (HYTRIN) 10 MG capsule Take 10 mg by mouth at bedtime.     No Known Allergies  Social History   Socioeconomic History  . Marital status: Married    Spouse name: Not on file  . Number of children: Not on file  . Years of education: Not on file  . Highest education level: Not on file  Occupational History  . Not on file  Tobacco Use  . Smoking status: Former Smoker    Packs/day: 3.00    Years: 25.00    Pack years: 75.00    Types: Cigarettes    Quit date: 08/06/1987    Years since quitting: 32.4  . Smokeless tobacco: Never Used  Substance and Sexual Activity  . Alcohol use: No  . Drug use: No  . Sexual activity: Not on file  Other Topics Concern  . Not on file  Social History Narrative  . Not on file   Social Determinants of Health   Financial Resource Strain:   . Difficulty of Paying Living Expenses: Not on file  Food Insecurity:   . Worried About Programme researcher, broadcasting/film/video in the Last Year: Not on file  . Ran Out of Food in the Last Year: Not on file  Transportation Needs:   . Lack of Transportation (Medical): Not on file  . Lack of Transportation (Non-Medical): Not on file  Physical  Activity:   . Days of Exercise per Week: Not on file  . Minutes of Exercise per Session: Not on file  Stress:   . Feeling of Stress : Not on file  Social Connections:   . Frequency of Communication with Friends and Family: Not on file  . Frequency of Social Gatherings with Friends and Family: Not on file  . Attends Religious Services: Not on file  . Active Member of Clubs or Organizations: Not on file  . Attends Banker Meetings: Not on file  . Marital Status: Not on file  Intimate Partner Violence:   . Fear of Current or Ex-Partner: Not on file  . Emotionally Abused: Not on file  . Physically Abused: Not on file  . Sexually Abused: Not on file     Review of Systems: General: negative for chills, fever, night sweats or weight changes.  Cardiovascular: negative for chest pain, dyspnea on exertion, edema, orthopnea, palpitations,  paroxysmal nocturnal dyspnea or shortness of breath Dermatological: negative for rash Respiratory: negative for cough or wheezing Urologic: negative for hematuria Abdominal: negative for nausea, vomiting, diarrhea, bright red blood per rectum, melena, or hematemesis Neurologic: negative for visual changes, syncope, or dizziness All other systems reviewed and are otherwise negative except as noted above.    Blood pressure (!) 168/76, pulse 72, height 5' 5.5" (1.664 m), weight 190 lb 9.6 oz (86.5 kg).  General appearance: alert and no distress Neck: no adenopathy, no JVD, supple, symmetrical, trachea midline, thyroid not enlarged, symmetric, no tenderness/mass/nodules and Bilateral carotid bruits Lungs: clear to auscultation bilaterally Heart: regular rate and rhythm, S1, S2 normal, no murmur, click, rub or gallop Extremities: extremities normal, atraumatic, no cyanosis or edema Pulses: 2+ and symmetric Skin: Skin color, texture, turgor normal. No rashes or lesions Neurologic: Alert and oriented X 3, normal strength and tone. Normal symmetric  reflexes. Normal coordination and gait  EKG sinus rhythm at 72 with right bundle branch block left axis deviation unchanged from prior EKGs.  I personally reviewed this EKG.  ASSESSMENT AND PLAN:   PAD (peripheral artery disease) (HCC) History of PAD status post bilateral iliac stenting using ICAST covered stents 06/22/2007.  Because of recurrent claudication and diminished ABIs he underwent reangiography by myself 07/23/2015 revealing 99% "in-stent restenosis" within the previously placed right common iliac artery stent and 30 to 40% in-stent restenosis within the left.  He underwent restenting using a Lifestream covered stent.  He did have thrombotic occlusion of his distal right common femoral artery, proximal SFA profunda branches ultimately underwent endarterectomy by Dr. Myra Burke 08/31/2015 with an excellent clinical result.  He currently denies claudication.  His most recent Doppler studies performed 07/21/2019 revealed a widely patent right common iliac artery stent with moderate disease in the left common iliac artery although he denies claudication.  Essential hypertension History of essential hypertension blood pressure measured today 168/76.  He is on diltiazem, lisinopril and hydrochlorothiazide.  He says he takes his blood pressure at home and usually runs in the 140/70 range.  Dyslipidemia History of dyslipidemia on statin therapy with lipid profile performed 12/03/2018 revealing total cholesterol of 119, LDL 53 and HDL 43.  We will recheck a fasting lipid liver profile.  Carotid artery disease (HCC) History of carotid artery disease status post right internal carotid stenting by myself and Dr. Myra Burke 05/24/2013.  His most recent carotid Dopplers performed 07/21/2019 revealing a widely patent right carotid stent with moderate left ICA stenosis.  This will be repeated on annual basis.      William Gess MD FACP,FACC,FAHA, [facility] 01/03/2020 4:32 PM

## 2020-01-03 NOTE — Assessment & Plan Note (Signed)
History of dyslipidemia on statin therapy with lipid profile performed 12/03/2018 revealing total cholesterol of 119, LDL 53 and HDL 43.  We will recheck a fasting lipid liver profile.

## 2020-01-03 NOTE — Patient Instructions (Signed)
Medication Instructions:  Your physician recommends that you continue on your current medications as directed. Please refer to the Current Medication list given to you today.  *If you need a refill on your cardiac medications before your next appointment, please call your pharmacy*   Lab Work: Your physician recommends that you return for lab work in: 1-2 weeks: lipid/liver profile.  If you have labs (blood work) drawn today and your tests are completely normal, you will receive your results only by: [name] MyChart Message (if you have MyChart) OR . A paper copy in the mail If you have any lab test that is abnormal or we need to change your treatment, we will call you to review the results.  Follow-Up: At [facility], you and your health needs are our priority.  As part of our continuing mission to provide you with exceptional heart care, we have created designated Provider Care Teams.  These Care Teams include your primary Cardiologist (physician) and Advanced Practice Providers (APPs -  Physician Assistants and Nurse Practitioners) who all work together to provide you with the care you need, when you need it.  We recommend signing up for the patient portal called "MyChart".  Sign up information is provided on this After Visit Summary.  MyChart is used to connect with patients for Virtual Visits (Telemedicine).  Patients are able to view lab/test results, encounter notes, upcoming appointments, etc.  Non-urgent messages can be sent to your provider as well.   To learn more about what you can do with MyChart, go to [email or web address].    Your next appointment:   12 month(s)  The format for your next appointment:   In Person  Provider:   [name], MD

## 2020-01-03 NOTE — Assessment & Plan Note (Signed)
History of essential hypertension blood pressure measured today 168/76.  He is on diltiazem, lisinopril and hydrochlorothiazide.  He says he takes his blood pressure at home and usually runs in the 140/70 range.

## 2020-01-03 NOTE — Assessment & Plan Note (Signed)
History of PAD status post bilateral iliac stenting using ICAST covered stents 06/22/2007.  Because of recurrent claudication and diminished ABIs he underwent reangiography by myself 07/23/2015 revealing 99% "in-stent restenosis" within the previously placed right common iliac artery stent and 30 to 40% in-stent restenosis within the left.  He underwent restenting using a Lifestream covered stent.  He did have thrombotic occlusion of his distal right common femoral artery, proximal SFA profunda branches ultimately underwent endarterectomy by Dr. Trula Slade 08/31/2015 with an excellent clinical result.  He currently denies claudication.  His most recent Doppler studies performed 07/21/2019 revealed a widely patent right common iliac artery stent with moderate disease in the left common iliac artery although he denies claudication.

## 2020-01-11 ENCOUNTER — Telehealth: Payer: Self-pay | Admitting: Cardiovascular Disease

## 2020-01-11 NOTE — Telephone Encounter (Signed)
Per wife, patient is wanting to have his lab work for Dr. Gwenlyn Found done at his PCP's office, Dr. Dalene Seltzer at [place] [facility] [place]. Please fax those orders to [phone] and on the cover sheet state "Patient would like to have lab work done at your office"

## 2020-01-11 NOTE — Telephone Encounter (Signed)
Orders printed to be faxed to patient's PCP office per request.   Left message that orders have been faxed

## 2020-01-16 ENCOUNTER — Encounter: Payer: Self-pay | Admitting: Neurology

## 2020-01-16 ENCOUNTER — Ambulatory Visit: Payer: Medicare HMO | Admitting: Neurology

## 2020-01-16 VITALS — BP 160/83 | HR 90 | Ht 65.0 in | Wt 188.6 lb

## 2020-01-16 DIAGNOSIS — E538 Deficiency of other specified B group vitamins: Secondary | ICD-10-CM | POA: Diagnosis not present

## 2020-01-16 DIAGNOSIS — R413 Other amnesia: Secondary | ICD-10-CM | POA: Diagnosis not present

## 2020-01-16 HISTORY — DX: Other amnesia: R41.3

## 2020-01-16 NOTE — Progress Notes (Signed)
Reason for visit: Memory disturbance  Referring physician: Dr. [name] is a 79 y.o. male  History of present illness:  Mr. William Burke is an 79 year old right-handed white male with a history of a memory disturbance that has been present for over a year.  The patient comes in with his wife today who concurs with the memory issues.  The patient has noted some short-term memory problems, with some difficulty with word finding and recent events.  The patient otherwise is functioning fairly well, he is able to operate a motor vehicle without difficulty.  The patient is able to manage his finances, he keeps up with his medications, he has a bit more difficulty with managing his appointments.  The patient does misplace things about the house on occasion.  He denies any family history of memory issues.  He denies problems with numbness or weakness of the extremities, he denies any significant troubles with headaches or dizziness.  He does have some difficulty with balance but he has not had any falls.  He and his wife believe that his memory troubles have gradually worsened some over time, he comes into the office today for further evaluation.  Past Medical History:  Diagnosis Date  . Anemia   . Arthritis    osteoarthritis knees, elbow right  . Carotid artery disease ([place])    status post right carotid artery stenting 05/24/13  . Complication of anesthesia    sensitive to anesthesia ,longer to awaken-"doesn't require much"  . Complication of anesthesia    "can't sleep after anesthesia wears off and i become very anxious"   . Diabetes mellitus, type 2 ([place])   . GERD (gastroesophageal reflux disease)    occ  . History of bronchitis   . History of pneumonia   . History of tobacco abuse   . Hyperlipidemia   . Hypertension   . Nocturia   . PAD (peripheral artery disease) ([place]) 06/22/2007   Bilateral iliac artery PTA and stenting with iCAST. Last lower extremity arterial Dopplers  02/25/2011 right ABI 1.0 left ABI 1.1.  Bilateral carotid artery stenosis. Last carotid Dopplers 07/18/2011: Max ICA stenosis 50-69% bilaterally  . UTI (lower urinary tract infection)   . Wears glasses     Past Surgical History:  Procedure Laterality Date  . APPENDECTOMY     ruptured as child taken out as adult  . Bowl surgery  1989   to free stricture-part of colon removed from appendix issue  . CARDIAC CATHETERIZATION     aortogram iliac artery stent  . CAROTID ENDARTERECTOMY    . CAROTID STENT Right 05/24/2013  . CAROTID STENT INSERTION Right 05/24/2013   Procedure: CAROTID STENT INSERTION;  Surgeon: Serafina Mitchell, MD;  Location: [facility] CATH LAB;  Service: Cardiovascular;  Laterality: Right;  . COLONOSCOPY    . ENDARTERECTOMY Right 09/02/2012   Procedure: ENDARTERECTOMY CAROTID with Resection of Common Carotid Artery;  Surgeon: Serafina Mitchell, MD;  Location: [facility];  Service: Vascular;  Laterality: Right;  . ENDARTERECTOMY FEMORAL Right 08/30/2015   Procedure: RIGHT FEMORAL ENDARTERECTOMY;  Surgeon: Serafina Mitchell, MD;  Location: [facility];  Service: Vascular;  Laterality: Right;  . LEG ANGIOGRAPHY  07/23/2015   1. abdominal aortogram, bilateral iliac angiogram, bifemoral runoff  . LEG ANGIOGRAPHY  07/23/2015   2. PTA and covered stent right common iliac artery  . NASAL POLYP SURGERY    . PARTIAL KNEE ARTHROPLASTY Left 01/08/2015   Procedure: LEFT  UNI KNEE  ARTHOLPLASTY MEDIALLY;  Surgeon: Paralee Cancel, MD;  Location: [facility];  Service: Orthopedics;  Laterality: Left;  . PATCH ANGIOPLASTY Right 09/02/2012   Procedure: PATCH ANGIOPLASTY using Vascu-Guard Vascular Patch;  Surgeon: Serafina Mitchell, MD;  Location: [facility];  Service: Vascular;  Laterality: Right;leg  . PATCH ANGIOPLASTY Right 08/30/2015   Procedure: PATCH ANGIOPLASTY;  Surgeon: Serafina Mitchell, MD;  Location: [facility];  Service: Vascular;  Laterality: Right;  . PERIPHERAL VASCULAR CATHETERIZATION N/A 07/23/2015   Procedure: Lower  Extremity Angiography;  Surgeon: Lorretta Harp, MD;  Location: [facility] CV LAB;  Service: Cardiovascular;  Laterality: N/A;  . PERIPHERAL VASCULAR CATHETERIZATION Right 07/23/2015   Procedure: Peripheral Vascular Intervention;  Surgeon: Lorretta Harp, MD;  Location: [facility] CV LAB;  Service: Cardiovascular;  Laterality: Right;  Rt Common Iliac  . TONSILLECTOMY    . WRIST SURGERY Right    pinning    Family History  Problem Relation Age of Onset  . Diabetes Mother   . Hypertension Mother   . Cerebral aneurysm Mother   . Multiple sclerosis Father   . Kidney disease Father     Social history:  reports that he quit smoking about 32 years ago. His smoking use included cigarettes. He has a 75.00 pack-year smoking history. He has never used smokeless tobacco. He reports that he does not drink alcohol and does not use drugs.  Medications:  Prior to Admission medications   Medication Sig Start Date End Date Taking? Authorizing Provider  aspirin EC 81 MG tablet Take 81 mg by mouth daily.   Yes [provider]  atorvastatin (LIPITOR) 80 MG tablet Take 1 tablet (80 mg total) by mouth daily at 6 PM. 07/24/15  Yes Bhagat, Bhavinkumar, PA  clopidogrel (PLAVIX) 75 MG tablet Take 1 tablet (75 mg total) by mouth daily. 05/25/13  Yes Ulyses Amor, PA-C  diltiazem (TIAZAC) 240 MG 24 hr capsule Take 240 mg by mouth daily.    Yes [provider]  ferrous sulfate 325 (65 FE) MG tablet Take 325 mg by mouth 2 (two) times daily with a meal.   Yes [provider]  fluticasone (FLONASE) 50 MCG/ACT nasal spray Place 1 spray into both nostrils at bedtime as needed.    Yes [provider]  furosemide (LASIX) 20 MG tablet Take 1 tablet by mouth daily. 03/26/19  Yes [provider]  glipiZIDE (GLUCOTROL) 10 MG tablet Take 2 tablets by mouth daily before breakfast.  06/22/12  Yes [provider]  liraglutide (VICTOZA) 18 MG/3ML SOPN 0.6 MG DAILY X 1 WEEK.  1.2 MG DAILY X 1 WEEK. 1.8 MG DAILY X 1 WEEK. 2.4 MG DAILY X 1 WEEK. 3.0 MG DAILY THEREAFTER 06/06/19  Yes [provider]  lisinopril-hydrochlorothiazide (PRINZIDE,ZESTORETIC) 20-12.5 MG tablet Take 1 tablet by mouth daily. 12/12/14  Yes [provider]  metFORMIN (GLUCOPHAGE) 1000 MG tablet Take 1,000 mg by mouth 2 (two) times daily with a meal.   Yes [provider]  niacin 500 MG tablet Take by mouth.   Yes [provider]  omeprazole (PRILOSEC) 20 MG capsule Take 20 mg by mouth 2 (two) times daily. 10/02/14  Yes [provider]  pioglitazone (ACTOS) 30 MG tablet Take 30 mg by mouth daily.  03/30/17  Yes [provider]  sitaGLIPtin (JANUVIA) 100 MG tablet Take 50 mg by mouth daily.   Yes [provider]  tamsulosin (FLOMAX) 0.4 MG CAPS capsule Take 1 tablet by mouth  daily. 11/15/19  Yes [provider]  terazosin (HYTRIN) 10 MG capsule Take 10 mg by mouth at bedtime.   Yes [provider]     No Known Allergies  ROS:  Out of a complete 14 system review of symptoms, the patient complains only of the following symptoms, and all other reviewed systems are negative.  Memory problems  Blood pressure (!) 160/83, pulse 90, height 5\' 5"  (1.651 m), weight 188 lb 9.6 oz (85.5 kg).  Physical Exam  General: The patient is alert and cooperative at the time of the examination.  The patient is moderately obese.  Eyes: Pupils are equal, round, and reactive to light. Discs are flat bilaterally.  Neck: The neck is supple, bilateral carotid bruits are noted.  Respiratory: The respiratory examination is clear.  Cardiovascular: The cardiovascular examination reveals a regular rate and rhythm, no obvious murmurs or rubs are noted.  Skin: Extremities are without significant edema.  Neurologic Exam  Mental status: The patient is alert and oriented x 3 at the time of the examination. The Mini-Mental status examination done  today shows a total score 28/30.  The patient is able to name 11 four-legged animals in 60 seconds.  Cranial nerves: Facial symmetry is present. There is good sensation of the face to pinprick and soft touch bilaterally. The strength of the facial muscles and the muscles to head turning and shoulder shrug are normal bilaterally. Speech is well enunciated, no aphasia or dysarthria is noted. Extraocular movements are full. Visual fields are full. The tongue is midline, and the patient has symmetric elevation of the soft palate. No obvious hearing deficits are noted.  Motor: The motor testing reveals 5 over 5 strength of all 4 extremities. Good symmetric motor tone is noted throughout.  Sensory: Sensory testing is intact to pinprick, soft touch, vibration sensation, and position sense on all 4 extremities. No evidence of extinction is noted.  Coordination: Cerebellar testing reveals good finger-nose-finger and heel-to-shin bilaterally.  Gait and station: Gait is normal. Tandem gait is unsteady. Romberg is negative. No drift is seen.  Reflexes: Deep tendon reflexes are symmetric, but are depressed bilaterally. Toes are downgoing bilaterally.   Assessment/Plan:  1.  Mild memory disturbance  2.  History of carotid artery disease, peripheral vascular disease  The patient does have risk factors for large and small vessel strokes with bilateral carotid bruits and a prior right carotid artery stent, hypertension, diabetes, and dyslipidemia.  He has peripheral vascular disease.  The patient will be set up for MRI of the brain, he is followed by Dr. Gwenlyn Found with carotid Doppler studies.  Blood work will be done today.  If the studies above are unremarkable, will consider initiating treatment with Aricept.  We will follow-up in 6 months.  Jill Alexanders MD 01/16/2020 3:11 PM  [facility] [address] [place], [place] [address]  Phone [phone] Fax [phone]

## 2020-01-17 ENCOUNTER — Telehealth: Payer: Self-pay | Admitting: Neurology

## 2020-01-17 LAB — RPR: RPR Ser Ql: NONREACTIVE

## 2020-01-17 LAB — VITAMIN B12: Vitamin B-12: 667 pg/mL (ref 232–1245)

## 2020-01-17 LAB — SEDIMENTATION RATE: Sed Rate: 3 mm/hr (ref 0–30)

## 2020-01-17 NOTE — Telephone Encounter (Signed)
[employer] medicare order sent to [facility]. They will obtain the auth and reach out to the patient to schedule.  °

## 2020-01-19 ENCOUNTER — Ambulatory Visit
Admission: RE | Admit: 2020-01-19 | Discharge: 2020-01-19 | Disposition: A | Discharge status: Home / Self Care | Payer: Medicare HMO | Source: Ambulatory Visit | Attending: Neurology | Admitting: Neurology

## 2020-01-19 ENCOUNTER — Other Ambulatory Visit: Payer: Self-pay

## 2020-01-19 DIAGNOSIS — R413 Other amnesia: Secondary | ICD-10-CM

## 2020-01-20 ENCOUNTER — Telehealth: Payer: Self-pay | Admitting: Neurology

## 2020-01-20 NOTE — Telephone Encounter (Signed)
I called the patient.  MRI of the brain shows mild atrophy and minimal white matter changes.  If patient desires to go on medication such as Aricept for memory, he is to call our office.   MRI brain 01/19/20:  IMPRESSION: This MRI of contrast shows the following: 1.    Mild to moderate generalized cortical atrophy, most pronounced in the medial temporal lobes. 2.    Some scattered T2/FLAIR hyperintense foci in the hemispheres consistent with mild chronic vessel ischemic change.  None of the foci appear acute 3.    Prior bilateral sinus surgery.  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    Single chronic microhemorrhage in the right posterior temporal lobe.  A single focus is unlikely to be clinically significant 5.    No acute findings.

## 2020-01-23 ENCOUNTER — Other Ambulatory Visit: Payer: Self-pay | Admitting: Neurology

## 2020-01-23 MED ORDER — DONEPEZIL HCL 10 MG PO TABS: 10.0000 mg | ORAL_TABLET | Freq: Every day | ORAL | 3 refills | Status: DC

## 2020-01-23 NOTE — Telephone Encounter (Signed)
Wife called. Husband also on the phone. They would like Aricept called into [id number] - [place], [place] - [address]. Advised I will send message to Dr. Leonie Man to call in medication since Dr. Jannifer Franklin is out of the office this week. She verbalized understanding.

## 2020-01-23 NOTE — Telephone Encounter (Signed)
done

## 2020-02-02 ENCOUNTER — Ambulatory Visit: Payer: Medicare HMO | Admitting: Neurology

## 2020-03-19 ENCOUNTER — Telehealth: Payer: Self-pay | Admitting: Neurology

## 2020-03-19 NOTE — Telephone Encounter (Signed)
Pt's wife [name] on [place] called wanting to discuss pt's donepezil (ARICEPT) 10 MG tablet with RN. Please advise.

## 2020-03-19 NOTE — Telephone Encounter (Signed)
Talked to patient's wife, she stated they have been taking the Aricept as directed for the last month.  Is asking if they should be seeing better results?  She stated they have seen no improvement.  Aware Dr. Jannifer Franklin is out of the office this week and may not be until next week before she hears anything regarding effectiveness.  Patient is tolerating Aricept without any side effects.

## 2020-03-20 NOTE — Telephone Encounter (Signed)
I called the wife, left a message.  Aricept does not improve memory, it slows down progression of memory so that it may affect his ability to function in the future, but unfortunately does nothing for the present.  If she has any further questions she is to call us.

## 2020-03-22 ENCOUNTER — Other Ambulatory Visit: Payer: Self-pay | Admitting: Neurology

## 2020-07-16 ENCOUNTER — Encounter: Payer: Self-pay | Admitting: Neurology

## 2020-07-16 ENCOUNTER — Ambulatory Visit: Payer: Medicare HMO | Admitting: Neurology

## 2020-07-16 VITALS — BP 138/73 | HR 72 | Ht 65.0 in | Wt 184.4 lb

## 2020-07-16 DIAGNOSIS — R413 Other amnesia: Secondary | ICD-10-CM

## 2020-07-16 MED ORDER — DONEPEZIL HCL 10 MG PO TABS: 10.0000 mg | ORAL_TABLET | Freq: Every day | ORAL | 3 refills | Status: DC

## 2020-07-16 MED ORDER — MEMANTINE HCL 10 MG PO TABS: 10.0000 mg | ORAL_TABLET | Freq: Two times a day (BID) | ORAL | 1 refills | Status: DC

## 2020-07-16 NOTE — Progress Notes (Signed)
I have read the note, and I agree with the clinical assessment and plan.  Connor Meacham K Oseph Imburgia   

## 2020-07-16 NOTE — Patient Instructions (Signed)
Start taking Namenda 10 mg twice daily, start taking 5 mg twice daily for 2 weeks, then increase to 10 mg twice daily Continue Aricept at current dosing See you back in 6 months

## 2020-07-16 NOTE — Progress Notes (Signed)
PATIENT: William Burke DOB: [birth year]  REASON FOR VISIT: follow up HISTORY FROM: patient Primary Neurologist: Dr. Anne Hahn  HISTORY OF PRESENT ILLNESS: Today 07/16/20 William Burke is an 80 year old male with history of memory disturbance.  MRI of the brain showed mild atrophy and minimal white matter changes. On Aricept 10 mg at bedtime, doesn't see any change. 1 month ago crazy dreams, started taking the AM. Still trouble with short term memory, asks repetitive questioning. Forgets appointments. Doesn't drive much, his wife does most, mainly because are always together. Wife manages medications within last year, he was missing doses. Goes on errands, forgets what he went for sometimes. Is history buff, remembers all the dates. Sleeping well, eating well. He enjoys mowing 3 acres, work in [employer]. Doesn't hear well, has hearing aids. Did remember what he did for Father's Day yesterday. Here today with his wife.  HISTORY 01/16/2020 Dr. Anne Hahn: William Burke is an 80 year old right-handed white male with a history of a memory disturbance that has been present for over a year.  The patient comes in with his wife today who concurs with the memory issues.  The patient has noted some short-term memory problems, with some difficulty with word finding and recent events.  The patient otherwise is functioning fairly well, he is able to operate a motor vehicle without difficulty.  The patient is able to manage his finances, he keeps up with his medications, he has a bit more difficulty with managing his appointments.  The patient does misplace things about the house on occasion.  He denies any family history of memory issues.  He denies problems with numbness or weakness of the extremities, he denies any significant troubles with headaches or dizziness.  He does have some difficulty with balance but he has not had any falls.  He and his wife believe that his memory troubles have gradually worsened some  over time, he comes into the office today for further evaluation.   REVIEW OF SYSTEMS: Out of a complete 14 system review of symptoms, the patient complains only of the following symptoms, and all other reviewed systems are negative.  Memory loss  ALLERGIES: No Known Allergies  HOME MEDICATIONS: Outpatient Medications Prior to Visit  Medication Sig Dispense Refill   atorvastatin (LIPITOR) 40 MG tablet Take 40 mg by mouth daily.     clopidogrel (PLAVIX) 75 MG tablet Take 1 tablet (75 mg total) by mouth daily. 30 tablet 3   diltiazem (TIAZAC) 240 MG 24 hr capsule Take 240 mg by mouth daily.      donepezil (ARICEPT) 10 MG tablet TAKE 1 TABLET (10 MG TOTAL) BY MOUTH AT BEDTIME. START 1/2 TABLET DAILY X 4 WEEKS AND THEN 1 TABLET DAILY 90 tablet 1   fluticasone (FLONASE) 50 MCG/ACT nasal spray Place 1 spray into both nostrils at bedtime as needed.      glipiZIDE (GLUCOTROL) 10 MG tablet Take 2 tablets by mouth daily before breakfast.      liraglutide (VICTOZA) 18 MG/3ML SOPN 0.6 MG DAILY X 1 WEEK. 1.2 MG DAILY X 1 WEEK. 1.8 MG DAILY X 1 WEEK. 2.4 MG DAILY X 1 WEEK. 3.0 MG DAILY THEREAFTER     lisinopril-hydrochlorothiazide (PRINZIDE,ZESTORETIC) 20-12.5 MG tablet Take 1 tablet by mouth daily.  1   metFORMIN (GLUCOPHAGE) 1000 MG tablet Take 1,000 mg by mouth 2 (two) times daily with a meal.     niacin 500 MG tablet Take by mouth.     omeprazole (PRILOSEC) 20  MG capsule Take 20 mg by mouth 2 (two) times daily.  1   pioglitazone (ACTOS) 30 MG tablet Take 30 mg by mouth daily.      tamsulosin (FLOMAX) 0.4 MG CAPS capsule Take 1 tablet by mouth daily.     aspirin EC 81 MG tablet Take 81 mg by mouth daily.     atorvastatin (LIPITOR) 80 MG tablet Take 1 tablet (80 mg total) by mouth daily at 6 PM. 30 tablet 3   furosemide (LASIX) 20 MG tablet Take 1 tablet by mouth daily.     sitaGLIPtin (JANUVIA) 100 MG tablet Take 50 mg by mouth daily.     terazosin (HYTRIN) 10 MG capsule Take 10 mg by mouth at  bedtime.     No facility-administered medications prior to visit.    PAST MEDICAL HISTORY: Past Medical History:  Diagnosis Date   Anemia    Arthritis    osteoarthritis knees, elbow right   Carotid artery disease (HCC)    status post right carotid artery stenting 05/24/13   Complication of anesthesia    sensitive to anesthesia ,longer to awaken-"doesn't require much"   Complication of anesthesia    "can't sleep after anesthesia wears off and i become very anxious"    Diabetes mellitus, type 2 (HCC)    GERD (gastroesophageal reflux disease)    occ   History of bronchitis    History of pneumonia    History of tobacco abuse    Hyperlipidemia    Hypertension    Memory difficulty 01/16/2020   Nocturia    PAD (peripheral artery disease) (HCC) 06/22/2007   Bilateral iliac artery PTA and stenting with iCAST. Last lower extremity arterial Dopplers 02/25/2011 right ABI 1.0 left ABI 1.1.  Bilateral carotid artery stenosis. Last carotid Dopplers 07/18/2011: Max ICA stenosis 50-69% bilaterally   UTI (lower urinary tract infection)    Wears glasses     PAST SURGICAL HISTORY: Past Surgical History:  Procedure Laterality Date   APPENDECTOMY     ruptured as child taken out as adult   Bowl surgery  1989   to free stricture-part of colon removed from appendix issue   CARDIAC CATHETERIZATION     aortogram iliac artery stent   CAROTID ENDARTERECTOMY     CAROTID STENT Right 05/24/2013   CAROTID STENT INSERTION Right 05/24/2013   Procedure: CAROTID STENT INSERTION;  Surgeon: Nada Libman, MD;  Location: [facility] CATH LAB;  Service: Cardiovascular;  Laterality: Right;   COLONOSCOPY     ENDARTERECTOMY Right 09/02/2012   Procedure: ENDARTERECTOMY CAROTID with Resection of Common Carotid Artery;  Surgeon: Nada Libman, MD;  Location: [facility] OR;  Service: Vascular;  Laterality: Right;   ENDARTERECTOMY FEMORAL Right 08/30/2015   Procedure: RIGHT FEMORAL ENDARTERECTOMY;  Surgeon: Nada Libman, MD;   Location: [facility] OR;  Service: Vascular;  Laterality: Right;   LEG ANGIOGRAPHY  07/23/2015   1. abdominal aortogram, bilateral iliac angiogram, bifemoral runoff   LEG ANGIOGRAPHY  07/23/2015   2. PTA and covered stent right common iliac artery   NASAL POLYP SURGERY     PARTIAL KNEE ARTHROPLASTY Left 01/08/2015   Procedure: LEFT  UNI KNEE ARTHOLPLASTY MEDIALLY;  Surgeon: Durene Romans, MD;  Location: [facility] ORS;  Service: Orthopedics;  Laterality: Left;   PATCH ANGIOPLASTY Right 09/02/2012   Procedure: PATCH ANGIOPLASTY using Vascu-Guard Vascular Patch;  Surgeon: Nada Libman, MD;  Location: [facility] OR;  Service: Vascular;  Laterality: Right;leg   PATCH ANGIOPLASTY Right 08/30/2015  Procedure: PATCH ANGIOPLASTY;  Surgeon: Nada Libman, MD;  Location: [facility] OR;  Service: Vascular;  Laterality: Right;   PERIPHERAL VASCULAR CATHETERIZATION N/A 07/23/2015   Procedure: Lower Extremity Angiography;  Surgeon: Runell Gess, MD;  Location: [facility] INVASIVE CV LAB;  Service: Cardiovascular;  Laterality: N/A;   PERIPHERAL VASCULAR CATHETERIZATION Right 07/23/2015   Procedure: Peripheral Vascular Intervention;  Surgeon: Runell Gess, MD;  Location: [facility] INVASIVE CV LAB;  Service: Cardiovascular;  Laterality: Right;  Rt Common Iliac   TONSILLECTOMY     WRIST SURGERY Right    pinning    FAMILY HISTORY: Family History  Problem Relation Age of Onset   Diabetes Mother    Hypertension Mother    Cerebral aneurysm Mother    Multiple sclerosis Father    Kidney disease Father     SOCIAL HISTORY: Social History   Socioeconomic History   Marital status: Married    Spouse name: Bjorn Loser   Number of children: Not on file   Years of education: Not on file   Highest education level: Not on file  Occupational History   Not on file  Tobacco Use   Smoking status: Former    Packs/day: 3.00    Years: 25.00    Pack years: 75.00    Types: Cigarettes    Quit date: 08/06/1987    Years since quitting: 32.9   Smokeless  tobacco: Never  Substance and Sexual Activity   Alcohol use: No   Drug use: No   Sexual activity: Not on file  Other Topics Concern   Not on file  Social History Narrative   Lives with wife   Right Handed   Drinks 6-8 cups caffeine daily   Social Determinants of Health   Financial Resource Strain: Not on file  Food Insecurity: Not on file  Transportation Needs: Not on file  Physical Activity: Not on file  Stress: Not on file  Social Connections: Not on file  Intimate Partner Violence: Not on file   PHYSICAL EXAM  Vitals:   07/16/20 1327  BP: 138/73  Pulse: 72  Weight: 184 lb 6.4 oz (83.6 kg)  Height: 5\' 5"  (1.651 m)   Body mass index is 30.69 kg/m.  Generalized: Well developed, in no acute distress  MMSE - Mini Mental State Exam 07/16/2020 01/16/2020  Orientation to time 4 4  Orientation to Place 5 5  Registration 3 3  Attention/ Calculation 5 5  Recall 2 2  Language- name 2 objects 2 2  Language- repeat 1 1  Language- follow 3 step command 3 3  Language- read & follow direction 1 1  Write a sentence 1 1  Copy design 1 1  Copy design-comments Animals w/ four legs in one minute: 11 11 animals  Total score 28 28    Neurological examination  Mentation: Alert oriented to time, place, history taking. Follows all commands speech and language fluent Cranial nerve II-XII: Pupils were equal round reactive to light. Extraocular movements were full, visual field were full on confrontational test. Facial sensation and strength were normal. Uvula tongue midline. Head turning and shoulder shrug  were normal and symmetric. Motor: The motor testing reveals 5 over 5 strength of all 4 extremities. Good symmetric motor tone is noted throughout.  Sensory: Sensory testing is intact to soft touch on all 4 extremities. No evidence of extinction is noted.  Coordination: Cerebellar testing reveals good finger-nose-finger and heel-to-shin bilaterally.  Gait and station: Gait is  normal. Reflexes: Deep  tendon reflexes are symmetric but depressed throughout   DIAGNOSTIC DATA (LABS, IMAGING, TESTING) - I reviewed patient records, labs, notes, testing and imaging myself where available.  Lab Results  Component Value Date   WBC 6.8 08/31/2015   HGB 11.1 (L) 08/31/2015   HCT 34.5 (L) 08/31/2015   MCV 88.5 08/31/2015   PLT 140 (L) 08/31/2015      Component Value Date/Time   NA 134 (L) 08/31/2015 0446   K 3.9 08/31/2015 0446   CL 97 (L) 08/31/2015 0446   CO2 30 08/31/2015 0446   GLUCOSE 165 (H) 08/31/2015 0446   BUN 11 08/31/2015 0446   CREATININE 0.84 08/31/2015 0446   CREATININE 0.74 07/18/2015 1035   CALCIUM 9.2 08/31/2015 0446   PROT 7.0 12/03/2018 0917   ALBUMIN 4.4 12/03/2018 0917   AST 20 12/03/2018 0917   ALT 25 12/03/2018 0917   ALKPHOS 53 12/03/2018 0917   BILITOT 0.4 12/03/2018 0917   GFRNONAA >60 08/31/2015 0446   GFRAA >60 08/31/2015 0446   Lab Results  Component Value Date   CHOL 119 12/03/2018   HDL 43 12/03/2018   LDLCALC 53 12/03/2018   TRIG 133 12/03/2018   CHOLHDL 2.8 12/03/2018   Lab Results  Component Value Date   HGBA1C 8.6 (H) 08/23/2015   Lab Results  Component Value Date   VITAMINB12 667 01/16/2020   Lab Results  Component Value Date   TSH 1.67 07/18/2015      ASSESSMENT AND PLAN 80 y.o. year old male  has a past medical history of Anemia, Arthritis, Carotid artery disease (HCC), Complication of anesthesia, Complication of anesthesia, Diabetes mellitus, type 2 (HCC), GERD (gastroesophageal reflux disease), History of bronchitis, History of pneumonia, History of tobacco abuse, Hyperlipidemia, Hypertension, Memory difficulty (01/16/2020), Nocturia, PAD (peripheral artery disease) (HCC) (06/22/2007), UTI (lower urinary tract infection), and Wears glasses. here with:   Mild Memory Disturbance  History of carotid artery disturbance, PVD  -MMSE stable 28/30 -Continue Aricept 10 mg daily  -Start Namenda working up  to 10 mg twice daily -RPR, B12, sed rate were unremarkable -MRI of the brain showed mild atrophy, minimal white matter changes -Keep follow-up with cardiology -Follow-up in 6 months or sooner if needed  Margie Ege, AGNP-C, DNP 07/16/2020, 1:50 PM [facility] [address] [place], [place] [address] [phone]

## 2020-07-20 ENCOUNTER — Encounter (HOSPITAL_COMMUNITY): Payer: Medicare HMO

## 2020-07-20 ENCOUNTER — Other Ambulatory Visit: Payer: Self-pay

## 2020-07-20 ENCOUNTER — Ambulatory Visit (HOSPITAL_BASED_OUTPATIENT_CLINIC_OR_DEPARTMENT_OTHER)
Admission: RE | Admit: 2020-07-20 | Discharge: 2020-07-20 | Disposition: A | Discharge status: Home / Self Care | Payer: Medicare HMO | Source: Ambulatory Visit | Attending: Cardiovascular Disease | Admitting: Cardiovascular Disease

## 2020-07-20 ENCOUNTER — Ambulatory Visit (HOSPITAL_COMMUNITY)
Admission: RE | Admit: 2020-07-20 | Discharge: 2020-07-20 | Disposition: A | Discharge status: Home / Self Care | Payer: Medicare HMO | Source: Ambulatory Visit | Attending: Cardiology | Admitting: Cardiology

## 2020-07-20 ENCOUNTER — Other Ambulatory Visit (HOSPITAL_COMMUNITY): Payer: Self-pay | Admitting: Cardiovascular Disease

## 2020-07-20 DIAGNOSIS — I739 Peripheral vascular disease, unspecified: Secondary | ICD-10-CM | POA: Insufficient documentation

## 2020-07-20 DIAGNOSIS — I6523 Occlusion and stenosis of bilateral carotid arteries: Secondary | ICD-10-CM | POA: Insufficient documentation

## 2020-07-20 DIAGNOSIS — Z95820 Peripheral vascular angioplasty status with implants and grafts: Secondary | ICD-10-CM

## 2020-07-20 DIAGNOSIS — Z95828 Presence of other vascular implants and grafts: Secondary | ICD-10-CM

## 2020-07-24 ENCOUNTER — Other Ambulatory Visit: Payer: Self-pay

## 2020-07-24 DIAGNOSIS — I6523 Occlusion and stenosis of bilateral carotid arteries: Secondary | ICD-10-CM

## 2020-07-24 DIAGNOSIS — I739 Peripheral vascular disease, unspecified: Secondary | ICD-10-CM

## 2020-07-24 NOTE — Progress Notes (Signed)
vas 

## 2020-07-24 NOTE — Progress Notes (Signed)
va

## 2020-08-01 ENCOUNTER — Ambulatory Visit: Payer: Medicare HMO | Admitting: Cardiovascular Disease

## 2020-08-01 ENCOUNTER — Other Ambulatory Visit: Payer: Self-pay

## 2020-08-01 ENCOUNTER — Encounter: Payer: Self-pay | Admitting: Cardiovascular Disease

## 2020-08-01 DIAGNOSIS — I10 Essential (primary) hypertension: Secondary | ICD-10-CM

## 2020-08-01 DIAGNOSIS — E785 Hyperlipidemia, unspecified: Secondary | ICD-10-CM | POA: Diagnosis not present

## 2020-08-01 DIAGNOSIS — I739 Peripheral vascular disease, unspecified: Secondary | ICD-10-CM | POA: Diagnosis not present

## 2020-08-01 DIAGNOSIS — I6523 Occlusion and stenosis of bilateral carotid arteries: Secondary | ICD-10-CM | POA: Diagnosis not present

## 2020-08-01 NOTE — Assessment & Plan Note (Addendum)
History of peripheral arterial disease status post bilateral iliac stenting by myself 06/22/2007.  He had progressive claudication with Dopplers that suggested high-grade right internal iliac stenosis with a decline in his ABI to 0.65.  I angiogram him on 07/23/2015 revealing a 99% "in-stent restenosis.  Restented him with a "Lifestream covered stent" with excellent result.  He did have thrombotic occlusion of his right common femoral, profunda femoris and proximal SFA and underwent endarterectomy and patch angioplasty by Dr. Trula Slade at that time.  He currently denies claudication.  His ABIs are noncompressible but Doppler studies performed 07/20/2020 did show restenosis in both iliac arteries.  We will continue to follow this on an annual basis.

## 2020-08-01 NOTE — Assessment & Plan Note (Signed)
History of carotid artery disease status post carotid artery stenting by myself and Dr. Raynaldo Opitz using distal embolic protection.  He does have moderate left ICA stenosis.  Recent carotid Dopplers performed 07/20/2020 revealed a patent carotid stent with moderate left ICA stenosis which has remained stable.  We will repeat his carotid Dopplers in 1 year.

## 2020-08-01 NOTE — Patient Instructions (Signed)
Medication Instructions:  No Changes In Medications at this time.  *If you need a refill on your cardiac medications before your next appointment, please call your pharmacy*  Lab Work: LIPID/LIVER BLOOD WORK TODAY  If you have labs (blood work) drawn today and your tests are completely normal, you will receive your results only by: [place] (if you have MyChart) OR A paper copy in the mail If you have any lab test that is abnormal or we need to change your treatment, we will call you to review the results.  Testing/Procedures: Your physician has requested that you have a lower extremity arterial duplex IN ONE YEAR. During this test, ultrasound is used to evaluate arterial blood flow in the legs. Allow one hour for this exam. There are no restrictions or special instructions. This will take place at [place], [address].  Your physician has requested that you have an ankle brachial index (ABI) IN ONE YEAR. During this test an ultrasound and blood pressure cuff are used to evaluate the arteries that supply the arms and legs with blood. Allow thirty minutes for this exam. There are no restrictions or special instructions. This will take place at [place], [address].   Your physician has requested that you have a carotid duplex IN ONE YEAR. This test is an ultrasound of the carotid arteries in your neck. It looks at blood flow through these arteries that supply the brain with blood. Allow one hour for this exam. There are no restrictions or special instructions.  Follow-Up: At [facility], you and your health needs are our priority.  As part of our continuing mission to provide you with exceptional heart care, we have created designated Provider Care Teams.  These Care Teams include your primary Cardiologist (physician) and Advanced Practice Providers (APPs -  Physician Assistants and Nurse Practitioners) who all work together to provide you with the care you need, when  you need it.  Your next appointment:   1 year(s)  The format for your next appointment:   In Person  Provider:   [name], MD

## 2020-08-01 NOTE — Assessment & Plan Note (Addendum)
History of essential hypertension a blood pressure measured today 162/78.  Blood pressure at the end of the visit had somewhat come down to 156/78.  He is on diltiazem, lisinopril and hydrochlorothiazide.

## 2020-08-01 NOTE — Assessment & Plan Note (Signed)
History of dyslipidemia on statin therapy with lipid profile performed 12/03/2018 revealing total cholesterol 119, LDL 53 and HDL 43.  We will recheck a lipid and liver profile this morning.

## 2020-08-01 NOTE — Progress Notes (Signed)
08/01/2020 William Burke   [birth year]  [id number]  Primary Physician Roderick Pee, PA Primary Cardiologist: Runell Gess MD FACP, [place], [place], [place]  HPI:  William Burke is a 80 y.o.  mildly to moderately overweight married Caucasian male father of 15, grandfather to 6 grandchildren who I saw 01/03/2020.  He is accompanied by his wife today. He has a history of bilateral iliac artery PTA and stenting by myself Jun 22, 2007 using iCAST covered stents for lifestyle-limiting claudication. This resulted in marked improvement in his symptoms on Dopplers. His other problems include non-insulin-requiring diabetes, hypertension and hyperlipidemia as well as remote tobacco abuse. He denies chest pain or shortness of breath. A Myoview performed in April of 2009 was nonischemic. An echo revealed normal LV systolic function with mild concentric LVH. He does have bilateral carotid disease which by duplex ultrasound performed this past June showed progression of disease on both sides. He is neurologically asymptomatic. Recent lower extremity Dopplers performed 06/24/12 showed his stents to be widely patent.carotid Dopplers performed on the same day showed significant progression of disease on the right side now with a hemodynamic significant stenosis. Most recent lab work performed earlier this month revealed a total cholesterol of 157, LDL of 89, and HDL of 38.   He had carotid Dopplers performed 08/06/12 that showed significant progression of disease on the right side now in the critical range. He does complain of some "tingling" in his right cheek. He is intolerant to Plavix because of epistaxis. Dr. Myra Gianotti and I performed right internal carotid artery stenting on [name] 05/24/13 using distal embolic protection. The angiographic and clinical result excellent. Patient denies chest pain or shortness of breath but does complain of relatively new onset right lower extremity claudication. His ABI has fallen  2.65 on the right which is significant compared to his prior Doppler 2015 when it was 0.95.He has had a left total knee replacement performed by Dr. Charlann Boxer in December of last year. Because of progressive claudication and Dopplers that suggested a high-grade right internal iliac stenosis with a decline in his right ABI to .65. Angiogram him on 07/23/15 revealing 99% "in-stent restenosis within the previously placed right common iliac artery stent. I restented him with a Lifestream covered stent resulting in reduction of his stenosis to 30% residual. He did have 95% thrombotic occlusion of his distal right common femoral, proximal SFA and profunda branches. He underwent right common femoral, profunda and SFA endarterectomy and patch angioplasty by Dr. Myra Gianotti 08/31/15 with an excellent technical result. His wound has finally healed. His most recent Dopplers performed 07/13/2017 revealed normal ABIs bilaterally with a patent stent.  He denies claudication.    Since I saw him in the office 7 months ago he continues to do well.  He denies chest pain, shortness of breath or claudication.  Carotid Doppler studies revealed a patent right carotid stent with moderate left ICA stenosis.  Lower extremity arterial Doppler studies revealed progression of disease in both iliac arteries although he continues to deny claudication.     Current Meds  Medication Sig   atorvastatin (LIPITOR) 40 MG tablet Take 40 mg by mouth daily.   clopidogrel (PLAVIX) 75 MG tablet Take 1 tablet (75 mg total) by mouth daily.   diltiazem (TIAZAC) 240 MG 24 hr capsule Take 240 mg by mouth daily.    donepezil (ARICEPT) 10 MG tablet Take 1 tablet (10 mg total) by mouth at bedtime.   fluticasone (FLONASE)  50 MCG/ACT nasal spray Place 1 spray into both nostrils at bedtime as needed.    glipiZIDE (GLUCOTROL) 10 MG tablet Take 2 tablets by mouth daily before breakfast.    liraglutide (VICTOZA) 18 MG/3ML SOPN 0.6 MG DAILY X 1 WEEK. 1.2 MG DAILY X 1  WEEK. 1.8 MG DAILY X 1 WEEK. 2.4 MG DAILY X 1 WEEK. 3.0 MG DAILY THEREAFTER   lisinopril-hydrochlorothiazide (PRINZIDE,ZESTORETIC) 20-12.5 MG tablet Take 1 tablet by mouth daily.   memantine (NAMENDA) 10 MG tablet Take 1 tablet (10 mg total) by mouth 2 (two) times daily.   metFORMIN (GLUCOPHAGE) 1000 MG tablet Take 1,000 mg by mouth 2 (two) times daily with a meal.   niacin 500 MG tablet Take by mouth.   omeprazole (PRILOSEC) 20 MG capsule Take 20 mg by mouth 2 (two) times daily.   pioglitazone (ACTOS) 30 MG tablet Take 30 mg by mouth daily.    tamsulosin (FLOMAX) 0.4 MG CAPS capsule Take 1 tablet by mouth daily.     No Known Allergies  Social History   Socioeconomic History   Marital status: Married    Spouse name: Bjorn Loser   Number of children: Not on file   Years of education: Not on file   Highest education level: Not on file  Occupational History   Not on file  Tobacco Use   Smoking status: Former    Packs/day: 3.00    Years: 25.00    Pack years: 75.00    Types: Cigarettes    Quit date: 08/06/1987    Years since quitting: 33.0   Smokeless tobacco: Never  Substance and Sexual Activity   Alcohol use: No   Drug use: No   Sexual activity: Not on file  Other Topics Concern   Not on file  Social History Narrative   Lives with wife   Right Handed   Drinks 6-8 cups caffeine daily   Social Determinants of Health   Financial Resource Strain: Not on file  Food Insecurity: Not on file  Transportation Needs: Not on file  Physical Activity: Not on file  Stress: Not on file  Social Connections: Not on file  Intimate Partner Violence: Not on file     Review of Systems: General: negative for chills, fever, night sweats or weight changes.  Cardiovascular: negative for chest pain, dyspnea on exertion, edema, orthopnea, palpitations, paroxysmal nocturnal dyspnea or shortness of breath Dermatological: negative for rash Respiratory: negative for cough or wheezing Urologic:  negative for hematuria Abdominal: negative for nausea, vomiting, diarrhea, bright red blood per rectum, melena, or hematemesis Neurologic: negative for visual changes, syncope, or dizziness All other systems reviewed and are otherwise negative except as noted above.    Blood pressure (!) 162/78, pulse 75, height 5\' 5"  (1.651 m), weight 185 lb 3.2 oz (84 kg), SpO2 95 %.  General appearance: alert and no distress Neck: no adenopathy, no JVD, supple, symmetrical, trachea midline, thyroid not enlarged, symmetric, no tenderness/mass/nodules, and bilateral carotid bruits Lungs: clear to auscultation bilaterally Heart: regular rate and rhythm, S1, S2 normal, no murmur, click, rub or gallop Extremities: extremities normal, atraumatic, no cyanosis or edema Pulses: 2+ and symmetric Skin: Skin color, texture, turgor normal. No rashes or lesions Neurologic: Grossly normal  EKG sinus rhythm at 75 with right bundle branch block and left anterior fascicular block (bifascicular block) with inferior Q waves.  I personally reviewed this EKG.  ASSESSMENT AND PLAN:   PAD (peripheral artery disease) (HCC) History of peripheral arterial disease status  post bilateral iliac stenting by myself 06/22/2007.  He had progressive claudication with Dopplers that suggested high-grade right internal iliac stenosis with a decline in his ABI to 0.65.  I angiogram him on 07/23/2015 revealing a 99% "in-stent restenosis.  Restented him with a "Lifestream covered stent" with excellent result.  He did have thrombotic occlusion of his right common femoral, profunda femoris and proximal SFA and underwent endarterectomy and patch angioplasty by Dr. Myra Gianotti at that time.  He currently denies claudication.  His ABIs are noncompressible but Doppler studies performed 07/20/2020 did show restenosis in both iliac arteries.  We will continue to follow this on an annual basis.  Essential hypertension History of essential hypertension a blood  pressure measured today 162/78.  Blood pressure at the end of the visit had somewhat come down to 156/78.  He is on diltiazem, lisinopril and hydrochlorothiazide.  Dyslipidemia History of dyslipidemia on statin therapy with lipid profile performed 12/03/2018 revealing total cholesterol 119, LDL 53 and HDL 43.  We will recheck a lipid and liver profile this morning.  Carotid artery disease (HCC) History of carotid artery disease status post carotid artery stenting by myself and Dr. Dolan Amen using distal embolic protection.  He does have moderate left ICA stenosis.  Recent carotid Dopplers performed 07/20/2020 revealed a patent carotid stent with moderate left ICA stenosis which has remained stable.  We will repeat his carotid Dopplers in 1 year.     Runell Gess MD FACP,FACC,FAHA, [facility] 08/01/2020 12:17 PM

## 2020-08-02 LAB — LIPID PANEL
Chol/HDL Ratio: 2.4 ratio (ref 0.0–5.0)
Cholesterol, Total: 137 mg/dL (ref 100–199)
HDL: 57 mg/dL (ref >39)
LDL Chol Calc (NIH): 63 mg/dL (ref 0–99)
Triglycerides: 88 mg/dL (ref 0–149)
VLDL Cholesterol Cal: 17 mg/dL (ref 5–40)

## 2020-08-02 LAB — HEPATIC FUNCTION PANEL
ALT: 24 IU/L (ref 0–44)
AST: 27 IU/L (ref 0–40)
Albumin: 4.5 g/dL (ref 3.7–4.7)
Alkaline Phosphatase: 59 IU/L (ref 44–121)
Bilirubin Total: 0.4 mg/dL (ref 0.0–1.2)
Bilirubin, Direct: 0.16 mg/dL (ref 0.00–0.40)
Total Protein: 6.9 g/dL (ref 6.0–8.5)

## 2021-01-13 ENCOUNTER — Other Ambulatory Visit: Payer: Self-pay | Admitting: Neurology

## 2021-01-14 NOTE — Telephone Encounter (Signed)
Rx refilled.

## 2021-01-17 ENCOUNTER — Ambulatory Visit: Payer: Medicare HMO | Admitting: Neurology

## 2021-05-16 ENCOUNTER — Ambulatory Visit: Payer: Medicare HMO | Admitting: Neurology

## 2021-05-16 ENCOUNTER — Encounter: Payer: Self-pay | Admitting: Neurology

## 2021-05-16 DIAGNOSIS — G301 Alzheimer's disease with late onset: Secondary | ICD-10-CM | POA: Diagnosis not present

## 2021-05-16 DIAGNOSIS — F02B0 Dementia in other diseases classified elsewhere, moderate, without behavioral disturbance, psychotic disturbance, mood disturbance, and anxiety: Secondary | ICD-10-CM | POA: Diagnosis not present

## 2021-05-16 DIAGNOSIS — F0390 Unspecified dementia without behavioral disturbance: Secondary | ICD-10-CM | POA: Insufficient documentation

## 2021-05-16 MED ORDER — MEMANTINE HCL 10 MG PO TABS: 10.0000 mg | ORAL_TABLET | Freq: Two times a day (BID) | ORAL | 3 refills | Status: DC

## 2021-05-16 MED ORDER — DONEPEZIL HCL 10 MG PO TABS: 10.0000 mg | ORAL_TABLET | Freq: Every day | ORAL | 3 refills | Status: DC

## 2021-05-16 NOTE — Patient Instructions (Signed)
Recommend you refrain from driving, check into drivers evaluation on paperwork ?Continue Aricept and Namenda ?See you back in 1 year  ?

## 2021-05-16 NOTE — Progress Notes (Signed)
? ? ?PATIENT: William Burke ?DOB: [birth year] ? ?REASON FOR VISIT: follow up for memory ?HISTORY FROM: patient, wife ?Primary Neurologist: Dr. Dalia Heading. [place]  ? ?HISTORY OF PRESENT ILLNESS: ?Today 05/16/21 ?William Burke here today for follow-up.  On Aricept and Namenda.  On BuSpar for anxiety, has been helpful. MMSE 22/30 today. Short term memory is poor. Wife manages medications. Drives short distances, few times has gotten lost but has been able to redirect himself to planned location. Mows the lawn, piddles in the garage, will be opening swimming pool at their house. Is more sedentary, likes to relax. At times can use profanity if he is upset.  ? ?Update 07/16/2020 SS: William Burke is an 81 year old male with history of memory disturbance.  MRI of the brain showed mild atrophy and minimal white matter changes. On Aricept 10 mg at bedtime, doesn't see any change. 1 month ago crazy dreams, started taking the AM. Still trouble with short term memory, asks repetitive questioning. Forgets appointments. Doesn't drive much, his wife does most, mainly because are always together. Wife manages medications within last year, he was missing doses. Goes on errands, forgets what he went for sometimes. Is history buff, remembers all the dates. Sleeping well, eating well. He enjoys mowing 3 acres, work in [employer]. Doesn't hear well, has hearing aids. Did remember what he did for Father's Day yesterday. Here today with his wife. ? ?HISTORY ?01/16/2020 Dr. Anne Hahn: William Burke is an 81 year old right-handed white male with a history of a memory disturbance that has been present for over a year.  The patient comes in with his wife today who concurs with the memory issues.  The patient has noted some short-term memory problems, with some difficulty with word finding and recent events.  The patient otherwise is functioning fairly well, he is able to operate a motor vehicle without difficulty.  The patient is able to manage  his finances, he keeps up with his medications, he has a bit more difficulty with managing his appointments.  The patient does misplace things about the house on occasion.  He denies any family history of memory issues.  He denies problems with numbness or weakness of the extremities, he denies any significant troubles with headaches or dizziness.  He does have some difficulty with balance but he has not had any falls.  He and his wife believe that his memory troubles have gradually worsened some over time, he comes into the office today for further evaluation.  ? ?REVIEW OF SYSTEMS: Out of a complete 14 system review of symptoms, the patient complains only of the following symptoms, and all other reviewed systems are negative. ? ?See HPI ? ?ALLERGIES: ?No Known Allergies ? ?HOME MEDICATIONS: ?Outpatient Medications Prior to Visit  ?Medication Sig Dispense Refill  ? atorvastatin (LIPITOR) 40 MG tablet Take 40 mg by mouth daily.    ? busPIRone (BUSPAR) 7.5 MG tablet Take 7.5 mg by mouth 2 (two) times daily.    ? clopidogrel (PLAVIX) 75 MG tablet Take 1 tablet (75 mg total) by mouth daily. 30 tablet 3  ? diltiazem (TIAZAC) 240 MG 24 hr capsule Take 240 mg by mouth daily.     ? donepezil (ARICEPT) 10 MG tablet Take 1 tablet (10 mg total) by mouth at bedtime. 90 tablet 3  ? glipiZIDE (GLUCOTROL) 10 MG tablet Take 2 tablets by mouth daily before breakfast.     ? Liraglutide (VICTOZA [place]) Inject into the skin.    ? lisinopril-hydrochlorothiazide (PRINZIDE,ZESTORETIC) 20-12.5  MG tablet Take 1 tablet by mouth daily.  1  ? memantine (NAMENDA) 10 MG tablet TAKE 1 TABLET BY MOUTH TWICE A DAY 180 tablet 1  ? metFORMIN (GLUCOPHAGE) 1000 MG tablet Take 1,000 mg by mouth 2 (two) times daily with a meal.    ? niacin 500 MG tablet Take by mouth.    ? omeprazole (PRILOSEC) 20 MG capsule Take 20 mg by mouth daily.  1  ? pioglitazone (ACTOS) 30 MG tablet Take 30 mg by mouth daily.     ? tamsulosin (FLOMAX) 0.4 MG CAPS capsule Take 1  tablet by mouth daily.    ? UNABLE TO FIND Med Name: calcium, magnesium, saw palmetto    ? furosemide (LASIX) 20 MG tablet Take 10 mg by mouth daily.    ? KLOR-CON M10 10 MEQ tablet Take 10 mEq by mouth daily.    ? fluticasone (FLONASE) 50 MCG/ACT nasal spray Place 1 spray into both nostrils at bedtime as needed.     ? liraglutide (VICTOZA) 18 MG/3ML SOPN 0.6 MG DAILY X 1 WEEK. 1.2 MG DAILY X 1 WEEK. 1.8 MG DAILY X 1 WEEK. 2.4 MG DAILY X 1 WEEK. 3.0 MG DAILY THEREAFTER    ? ?No facility-administered medications prior to visit.  ? ? ?PAST MEDICAL HISTORY: ?Past Medical History:  ?Diagnosis Date  ? Anemia   ? Arthritis   ? osteoarthritis knees, elbow right  ? Carotid artery disease (HCC)   ? status post right carotid artery stenting 05/24/13  ? Complication of anesthesia   ? sensitive to anesthesia ,longer to awaken-"doesn't require much"  ? Complication of anesthesia   ? "can't sleep after anesthesia wears off and i become very anxious"   ? Diabetes mellitus, type 2 (HCC)   ? GERD (gastroesophageal reflux disease)   ? occ  ? History of bronchitis   ? History of pneumonia   ? History of tobacco abuse   ? Hyperlipidemia   ? Hypertension   ? Memory difficulty 01/16/2020  ? Nocturia   ? PAD (peripheral artery disease) (HCC) 06/22/2007  ? Bilateral iliac artery PTA and stenting with iCAST. Last lower extremity arterial Dopplers 02/25/2011 right ABI 1.0 left ABI 1.1.  Bilateral carotid artery stenosis. Last carotid Dopplers 07/18/2011: Max ICA stenosis 50-69% bilaterally  ? UTI (lower urinary tract infection)   ? Wears glasses   ? ? ?PAST SURGICAL HISTORY: ?Past Surgical History:  ?Procedure Laterality Date  ? APPENDECTOMY    ? ruptured as child taken out as adult  ? Bowl surgery  1989  ? to free stricture-part of colon removed from appendix issue  ? CARDIAC CATHETERIZATION    ? aortogram iliac artery stent  ? CAROTID ENDARTERECTOMY    ? CAROTID STENT Right 05/24/2013  ? CAROTID STENT INSERTION Right 05/24/2013  ? Procedure:  CAROTID STENT INSERTION;  Surgeon: Nada Libman, MD;  Location: [facility] CATH LAB;  Service: Cardiovascular;  Laterality: Right;  ? COLONOSCOPY    ? ENDARTERECTOMY Right 09/02/2012  ? Procedure: ENDARTERECTOMY CAROTID with Resection of Common Carotid Artery;  Surgeon: Nada Libman, MD;  Location: [facility] OR;  Service: Vascular;  Laterality: Right;  ? ENDARTERECTOMY FEMORAL Right 08/30/2015  ? Procedure: RIGHT FEMORAL ENDARTERECTOMY;  Surgeon: Nada Libman, MD;  Location: [facility] OR;  Service: Vascular;  Laterality: Right;  ? LEG ANGIOGRAPHY  07/23/2015  ? 1. abdominal aortogram, bilateral iliac angiogram, bifemoral runoff  ? LEG ANGIOGRAPHY  07/23/2015  ? 2. PTA and covered stent right  common iliac artery  ? NASAL POLYP SURGERY    ? PARTIAL KNEE ARTHROPLASTY Left 01/08/2015  ? Procedure: LEFT  UNI KNEE ARTHOLPLASTY MEDIALLY;  Surgeon: Durene Romans, MD;  Location: [facility] ORS;  Service: Orthopedics;  Laterality: Left;  ? PATCH ANGIOPLASTY Right 09/02/2012  ? Procedure: PATCH ANGIOPLASTY using Vascu-Guard Vascular Patch;  Surgeon: Nada Libman, MD;  Location: [facility] OR;  Service: Vascular;  Laterality: Right;leg  ? PATCH ANGIOPLASTY Right 08/30/2015  ? Procedure: PATCH ANGIOPLASTY;  Surgeon: Nada Libman, MD;  Location: [facility] OR;  Service: Vascular;  Laterality: Right;  ? PERIPHERAL VASCULAR CATHETERIZATION N/A 07/23/2015  ? Procedure: Lower Extremity Angiography;  Surgeon: Runell Gess, MD;  Location: [facility] INVASIVE CV LAB;  Service: Cardiovascular;  Laterality: N/A;  ? PERIPHERAL VASCULAR CATHETERIZATION Right 07/23/2015  ? Procedure: Peripheral Vascular Intervention;  Surgeon: Runell Gess, MD;  Location: [facility] INVASIVE CV LAB;  Service: Cardiovascular;  Laterality: Right;  Rt Common Iliac  ? TONSILLECTOMY    ? WRIST SURGERY Right   ? pinning  ? ? ?FAMILY HISTORY: ?Family History  ?Problem Relation Age of Onset  ? Diabetes Mother   ? Hypertension Mother   ? Cerebral aneurysm Mother   ? Multiple sclerosis Father   ? Kidney disease  Father   ? ? ?SOCIAL HISTORY: ?Social History  ? ?Socioeconomic History  ? Marital status: Married  ?  Spouse name: Bjorn Loser  ? Number of children: Not on file  ? Years of education: Not on file  ? Highest educa

## 2021-07-02 ENCOUNTER — Other Ambulatory Visit (HOSPITAL_COMMUNITY): Payer: Self-pay | Admitting: Cardiovascular Disease

## 2021-07-02 DIAGNOSIS — I739 Peripheral vascular disease, unspecified: Secondary | ICD-10-CM

## 2021-07-22 ENCOUNTER — Ambulatory Visit (HOSPITAL_BASED_OUTPATIENT_CLINIC_OR_DEPARTMENT_OTHER)
Admission: RE | Admit: 2021-07-22 | Discharge: 2021-07-22 | Disposition: A | Discharge status: Home / Self Care | Payer: Medicare HMO | Source: Ambulatory Visit | Attending: Cardiovascular Disease | Admitting: Cardiovascular Disease

## 2021-07-22 ENCOUNTER — Ambulatory Visit (HOSPITAL_COMMUNITY)
Admission: RE | Admit: 2021-07-22 | Discharge: 2021-07-22 | Disposition: A | Discharge status: Home / Self Care | Payer: Medicare HMO | Source: Ambulatory Visit | Attending: Cardiology | Admitting: Cardiology

## 2021-07-22 DIAGNOSIS — Z95820 Peripheral vascular angioplasty status with implants and grafts: Secondary | ICD-10-CM | POA: Diagnosis not present

## 2021-07-22 DIAGNOSIS — I10 Essential (primary) hypertension: Secondary | ICD-10-CM | POA: Insufficient documentation

## 2021-07-22 DIAGNOSIS — I739 Peripheral vascular disease, unspecified: Secondary | ICD-10-CM

## 2021-07-22 DIAGNOSIS — I6522 Occlusion and stenosis of left carotid artery: Secondary | ICD-10-CM

## 2021-07-23 ENCOUNTER — Telehealth: Payer: Self-pay | Admitting: Cardiovascular Disease

## 2021-07-23 NOTE — Telephone Encounter (Signed)
Pt spouse called stating that pt got an xray done and they were told that they would fax over the results over to Dr. Allyson Sabal. She wants to know if these results were received. Please advice.

## 2021-07-24 NOTE — Telephone Encounter (Signed)
XR CHEST PA AND LATERAL, 07/17/2021 12:00 PM   INDICATION: shortness of breath \ R06.02 Shortness of breath  COMPARISON: None   FINDINGS:   Cardiovascular: Extensive thoracic aortic atherosclerotic calcification..  Mediastinum: Fullness in the region of the aortopulmonary window. This could be further evaluated with chest CT.  Lungs/pleura: Subsegmental atelectasis within the lingular segment of the left upper lobe  Upper abdomen: Visualized portions are unremarkable.  Chest wall/osseous structures: Multiple healed left rib fractures. Degenerative changes of the thoracic spine.  Per PCP office would like chest x-ray and recent labs reviewed and advised on.

## 2021-08-12 ENCOUNTER — Encounter: Payer: Self-pay | Admitting: Cardiovascular Disease

## 2021-08-13 ENCOUNTER — Other Ambulatory Visit (HOSPITAL_COMMUNITY): Payer: Self-pay | Admitting: Cardiovascular Disease

## 2021-08-13 DIAGNOSIS — I779 Disorder of arteries and arterioles, unspecified: Secondary | ICD-10-CM

## 2021-08-13 DIAGNOSIS — I739 Peripheral vascular disease, unspecified: Secondary | ICD-10-CM

## 2021-08-20 NOTE — Telephone Encounter (Signed)
See my chart message

## 2022-03-13 ENCOUNTER — Ambulatory Visit: Payer: Medicare HMO | Attending: Cardiovascular Disease | Admitting: Cardiovascular Disease

## 2022-03-13 ENCOUNTER — Encounter: Payer: Self-pay | Admitting: Cardiovascular Disease

## 2022-03-13 VITALS — BP 164/76 | HR 71 | Ht 62.0 in | Wt 185.0 lb

## 2022-03-13 DIAGNOSIS — I10 Essential (primary) hypertension: Secondary | ICD-10-CM | POA: Diagnosis not present

## 2022-03-13 DIAGNOSIS — I6523 Occlusion and stenosis of bilateral carotid arteries: Secondary | ICD-10-CM

## 2022-03-13 DIAGNOSIS — E785 Hyperlipidemia, unspecified: Secondary | ICD-10-CM | POA: Diagnosis not present

## 2022-03-13 DIAGNOSIS — I739 Peripheral vascular disease, unspecified: Secondary | ICD-10-CM | POA: Diagnosis not present

## 2022-03-13 DIAGNOSIS — I4510 Unspecified right bundle-branch block: Secondary | ICD-10-CM

## 2022-03-13 DIAGNOSIS — Z87891 Personal history of nicotine dependence: Secondary | ICD-10-CM

## 2022-03-13 NOTE — Patient Instructions (Signed)
Medication Instructions:  Your physician recommends that you continue on your current medications as directed. Please refer to the Current Medication list given to you today.  *If you need a refill on your cardiac medications before your next appointment, please call your pharmacy*   Testing/Procedures: Your physician has requested that you have an echocardiogram. Echocardiography is a painless test that uses sound waves to create images of your heart. It provides your doctor with information about the size and shape of your heart and how well your heart's chambers and valves are working. This procedure takes approximately one hour. There are no restrictions for this procedure. Please do NOT wear cologne, perfume, aftershave, or lotions (deodorant is allowed). Please arrive 15 minutes prior to your appointment time. This procedure will be done at [address] has requested that you have a carotid duplex. This test is an ultrasound of the carotid arteries in your neck. It looks at blood flow through these arteries that supply the brain with blood. Allow one hour for this exam. There are no restrictions or special instructions. This will take place at [place], [address]. To be done in June.  Your physician has requested that you have an Aorta/Iliac Duplex. This will be take place at [place], [address].  No food after 11PM the night before.  Water is OK. (Don't drink liquids if you have been instructed not to for ANOTHER test) Avoid foods that produce bowel gas, for 24 hours prior to exam (see below). No breakfast, no chewing gum, no smoking or carbonated beverages. Patient may take morning medications with water. Come in for test at least 15 minutes early to register. To be done in June.  Your physician has requested that you have a lower extremity arterial duplex. During this test, ultrasound is used to evaluate arterial blood flow in the  legs. Allow one hour for this exam. There are no restrictions or special instructions. This will take place at [place], [address]. To be done in June.  Your physician has requested that you have an ankle brachial index (ABI). During this test an ultrasound and blood pressure cuff are used to evaluate the arteries that supply the arms and legs with blood. Allow thirty minutes for this exam. There are no restrictions or special instructions. This will take place at [place], [address]. To be done in June.     Follow-Up: At [facility], you and your health needs are our priority.  As part of our continuing mission to provide you with exceptional heart care, we have created designated Provider Care Teams.  These Care Teams include your primary Cardiologist (physician) and Advanced Practice Providers (APPs -  Physician Assistants and Nurse Practitioners) who all work together to provide you with the care you need, when you need it.  We recommend signing up for the patient portal called "MyChart".  Sign up information is provided on this After Visit Summary.  MyChart is used to connect with patients for Virtual Visits (Telemedicine).  Patients are able to view lab/test results, encounter notes, upcoming appointments, etc.  Non-urgent messages can be sent to your provider as well.   To learn more about what you can do with MyChart, go to [email or web address].    Your next appointment:   12 month(s)  Provider:   [name], MD

## 2022-03-13 NOTE — Progress Notes (Signed)
03/13/2022 William Burke   [birth year]  [id number]  Primary Physician [name], DO Primary Cardiologist: Runell Gess MD FACP, [place], [place], [place]  HPI:  William Burke is a 82 y.o.  mildly to moderately overweight married Caucasian male father of 89, grandfather to 6 grandchildren who I saw 08/01/2020.  He is accompanied by his wife William Burke today. He has a history of bilateral iliac artery PTA and stenting by myself Jun 22, 2007 using iCAST covered stents for lifestyle-limiting claudication. This resulted in marked improvement in his symptoms on Dopplers. His other problems include non-insulin-requiring diabetes, hypertension and hyperlipidemia as well as remote tobacco abuse. He denies chest pain or shortness of breath. A Myoview performed in April of 2009 was nonischemic. An echo revealed normal LV systolic function with mild concentric LVH. He does have bilateral carotid disease which by duplex ultrasound performed this past June showed progression of disease on both sides. He is neurologically asymptomatic. Recent lower extremity Dopplers performed 06/24/12 showed his stents to be widely patent.carotid Dopplers performed on the same day showed significant progression of disease on the right side now with a hemodynamic significant stenosis. Most recent lab work performed earlier this month revealed a total cholesterol of 157, LDL of 89, and HDL of 38.   He had carotid Dopplers performed 08/06/12 that showed significant progression of disease on the right side now in the critical range. He does complain of some "tingling" in his right cheek. He is intolerant to Plavix because of epistaxis. Dr. Myra Gianotti and I performed right internal carotid artery stenting on Mr. Prada 05/24/13 using distal embolic protection. The angiographic and clinical result excellent. Patient denies chest pain or shortness of breath but does complain of relatively new onset right lower extremity claudication. His ABI has  fallen 2.65 on the right which is significant compared to his prior Doppler 2015 when it was 0.95.He has had a left total knee replacement performed by Dr. Charlann Boxer in December of last year. Because of progressive claudication and Dopplers that suggested a high-grade right internal iliac stenosis with a decline in his right ABI to .65. Angiogram him on 07/23/15 revealing 99% "in-stent restenosis within the previously placed right common iliac artery stent. I restented him with a Lifestream covered stent resulting in reduction of his stenosis to 30% residual. He did have 95% thrombotic occlusion of his distal right common femoral, proximal SFA and profunda branches. He underwent right common femoral, profunda and SFA endarterectomy and patch angioplasty by Dr. Myra Gianotti 08/31/15 with an excellent technical result. His wound has finally healed. His most recent Dopplers performed 07/13/2017 revealed normal ABIs bilaterally with a patent stent.  He denies claudication.    Since I saw him in the office a year and a half go he continues to do well.  He denies chest pain, shortness of breath or claudication.  Carotid Doppler studies revealed a patent right carotid stent with moderate left ICA stenosis.  Lower extremity arterial Doppler studies revealed progression of disease in both iliac arteries although he continues to deny claudication.       Current Meds  Medication Sig   atorvastatin (LIPITOR) 40 MG tablet Take 40 mg by mouth daily.   busPIRone (BUSPAR) 7.5 MG tablet Take 7.5 mg by mouth 2 (two) times daily.   clopidogrel (PLAVIX) 75 MG tablet Take 1 tablet (75 mg total) by mouth daily.   diltiazem (TIAZAC) 240 MG 24 hr capsule Take 240 mg by mouth daily.  donepezil (ARICEPT) 10 MG tablet Take 1 tablet (10 mg total) by mouth at bedtime.   furosemide (LASIX) 20 MG tablet Take 10 mg by mouth daily.   glipiZIDE (GLUCOTROL) 10 MG tablet Take 2 tablets by mouth daily before breakfast.    KLOR-CON M10 10 MEQ tablet  Take 10 mEq by mouth daily.   Liraglutide (VICTOZA [place]) Inject into the skin.   lisinopril-hydrochlorothiazide (PRINZIDE,ZESTORETIC) 20-12.5 MG tablet Take 1 tablet by mouth daily.   memantine (NAMENDA) 10 MG tablet Take 1 tablet (10 mg total) by mouth 2 (two) times daily.   metFORMIN (GLUCOPHAGE) 1000 MG tablet Take 1,000 mg by mouth 2 (two) times daily with a meal.   niacin 500 MG tablet Take by mouth.   omeprazole (PRILOSEC) 20 MG capsule Take 20 mg by mouth daily.   pioglitazone (ACTOS) 30 MG tablet Take 30 mg by mouth daily.    tamsulosin (FLOMAX) 0.4 MG CAPS capsule Take 1 tablet by mouth daily.   UNABLE TO FIND Med Name: calcium, magnesium, saw palmetto     No Known Allergies  Social History   Socioeconomic History   Marital status: Married    Spouse name: William Burke   Number of children: Not on file   Years of education: Not on file   Highest education level: Not on file  Occupational History   Not on file  Tobacco Use   Smoking status: Former    Packs/day: 3.00    Years: 25.00    Total pack years: 75.00    Types: Cigarettes    Quit date: 08/06/1987    Years since quitting: 34.6   Smokeless tobacco: Never  Substance and Sexual Activity   Alcohol use: No   Drug use: No   Sexual activity: Not on file  Other Topics Concern   Not on file  Social History Narrative   Lives with wife   Right Handed   Drinks 6-8 cups caffeine daily   Social Determinants of Health   Financial Resource Strain: Not on file  Food Insecurity: Not on file  Transportation Needs: Not on file  Physical Activity: Not on file  Stress: Not on file  Social Connections: Not on file  Intimate Partner Violence: Not on file     Review of Systems: General: negative for chills, fever, night sweats or weight changes.  Cardiovascular: negative for chest pain, dyspnea on exertion, edema, orthopnea, palpitations, paroxysmal nocturnal dyspnea or shortness of breath Dermatological: negative for  rash Respiratory: negative for cough or wheezing Urologic: negative for hematuria Abdominal: negative for nausea, vomiting, diarrhea, bright red blood per rectum, melena, or hematemesis Neurologic: negative for visual changes, syncope, or dizziness All other systems reviewed and are otherwise negative except as noted above.    Blood pressure (!) 164/76, pulse 71, height 5\' 2"  (1.575 m), weight 185 lb (83.9 kg), SpO2 92 %.  General appearance: alert and no distress Neck: no adenopathy, no JVD, supple, symmetrical, trachea midline, thyroid not enlarged, symmetric, no tenderness/mass/nodules, and bilateral carotid bruits Lungs: clear to auscultation bilaterally Heart: 2/6 outflow tract murmur consistent with aortic stenosis and/or sclerosis. Extremities: extremities normal, atraumatic, no cyanosis or edema Pulses: 2+ and symmetric Skin: Skin color, texture, turgor normal. No rashes or lesions Neurologic: Grossly normal  EKG sinus rhythm at 66 with right bundle branch block and left axis deviation.  I personally reviewed this EKG.  ASSESSMENT AND PLAN:   History of tobacco abuse Long history tobacco abuse having quit 10 to 15 years  ago.  PAD (peripheral artery disease) (HCC) History of PAD/bilateral iliac stenting by myself 06/22/2007 with iCAST covered stents for lifestyle-limiting claudication.  Because of recurrent disease I restudied him 07/23/2015 revealing 99% "in-stent restenosis within the previously placed right common iliac artery stent.  I restented him with a Lifestream stent resulting in reduction of a 99% stenosis to 30% residual.  His Dopplers improved and his claudication resolved.  The time I did notice thrombotic 95% occlusion of his distal right common femoral artery, proximal SFA and profunda branches.  He underwent right common femoral endarterectomy, profunda and SFA endarterectomy with patch angioplasty by Dr. Myra Gianotti 8//17 with excellent result.  His Dopplers have  remained stable as recently as June of last year.  He denies claudication.  Essential hypertension History of essential hypertension with blood pressure measured today at 164/76.  He is on diltiazem, lisinopril and hydrochlorothiazide which she did not take this morning.  Dyslipidemia History of distal anemia on atorvastatin 40 mg a day lipid profile performed 08/01/2020 revealing total cholesterol of 137, LDL 63 and HDL of 57.  Carotid artery disease (HCC) History of carotid artery disease status post right internal artery stenting by myself and Dr. Dolan Amen with Dopplers that suggest this to be widely patent with moderate left ICA stenosis which we will follow on an annual basis.  RBBB Chronic     Runell Gess MD FACP,FACC,FAHA, [facility] 03/13/2022 10:55 AM

## 2022-03-13 NOTE — Assessment & Plan Note (Signed)
History of PAD/bilateral iliac stenting by myself 06/22/2007 with iCAST covered stents for lifestyle-limiting claudication.  Because of recurrent disease I restudied him 07/23/2015 revealing 99% "in-stent restenosis within the previously placed right common iliac artery stent.  I restented him with a Lifestream stent resulting in reduction of a 99% stenosis to 30% residual.  His Dopplers improved and his claudication resolved.  The time I did notice thrombotic 95% occlusion of his distal right common femoral artery, proximal SFA and profunda branches.  He underwent right common femoral endarterectomy, profunda and SFA endarterectomy with patch angioplasty by Dr. Trula Slade 8//17 with excellent result.  His Dopplers have remained stable as recently as June of last year.  He denies claudication.

## 2022-03-13 NOTE — Assessment & Plan Note (Signed)
History of carotid artery disease status post right internal artery stenting by myself and Dr. Raynaldo Opitz with Dopplers that suggest this to be widely patent with moderate left ICA stenosis which we will follow on an annual basis.

## 2022-03-13 NOTE — Assessment & Plan Note (Signed)
Long history tobacco abuse having quit 10 to 15 years ago.

## 2022-03-13 NOTE — Assessment & Plan Note (Signed)
History of essential hypertension with blood pressure measured today at 164/76.  He is on diltiazem, lisinopril and hydrochlorothiazide which she did not take this morning.

## 2022-03-13 NOTE — Assessment & Plan Note (Signed)
Chronic. 

## 2022-03-13 NOTE — Assessment & Plan Note (Signed)
History of distal anemia on atorvastatin 40 mg a day lipid profile performed 08/01/2020 revealing total cholesterol of 137, LDL 63 and HDL of 57.

## 2022-03-14 NOTE — Addendum Note (Signed)
Addended by: Amalia Hailey on: 03/14/2022 02:57 PM   Modules accepted: Orders

## 2022-04-15 ENCOUNTER — Ambulatory Visit (HOSPITAL_COMMUNITY): Payer: Medicare HMO

## 2022-05-09 ENCOUNTER — Encounter (HOSPITAL_COMMUNITY): Payer: Self-pay | Admitting: Cardiovascular Disease

## 2022-05-09 ENCOUNTER — Ambulatory Visit (HOSPITAL_COMMUNITY): Payer: Medicare HMO | Attending: Cardiovascular Disease

## 2022-05-21 NOTE — Progress Notes (Deleted)
PATIENT: William Burke DOB: 02-14-1940  REASON FOR VISIT: follow up for memory HISTORY FROM: patient, wife Primary Neurologist: Dr. Dalia Heading. [place]   HISTORY OF PRESENT ILLNESS: Today 05/21/22   Update 05/16/21 SS: William Burke here today for follow-up.  On Aricept and Namenda.  On BuSpar for anxiety, has been helpful. MMSE 22/30 today. Short term memory is poor. Wife manages medications. Drives short distances, few times has gotten lost but has been able to redirect himself to planned location. Mows the lawn, piddles in the garage, will be opening swimming pool at their house. Is more sedentary, likes to relax. At times can use profanity if he is upset.   Update 07/16/2020 SS: William Burke is an 82 year old male with history of memory disturbance.  MRI of the brain showed mild atrophy and minimal white matter changes. On Aricept 10 mg at bedtime, doesn't see any change. 1 month ago crazy dreams, started taking the AM. Still trouble with short term memory, asks repetitive questioning. Forgets appointments. Doesn't drive much, his wife does most, mainly because are always together. Wife manages medications within last year, he was missing doses. Goes on errands, forgets what he went for sometimes. Is history buff, remembers all the dates. Sleeping well, eating well. He enjoys mowing 3 acres, work in [employer]. Doesn't hear well, has hearing aids. Did remember what he did for Father's Day yesterday. Here today with his wife.  HISTORY 01/16/2020 Dr. Anne Hahn: William Burke is an 82 year old right-handed white male with a history of a memory disturbance that has been present for over a year.  The patient comes in with his wife today who concurs with the memory issues.  The patient has noted some short-term memory problems, with some difficulty with word finding and recent events.  The patient otherwise is functioning fairly well, he is able to operate a motor vehicle without difficulty.  The  patient is able to manage his finances, he keeps up with his medications, he has a bit more difficulty with managing his appointments.  The patient does misplace things about the house on occasion.  He denies any family history of memory issues.  He denies problems with numbness or weakness of the extremities, he denies any significant troubles with headaches or dizziness.  He does have some difficulty with balance but he has not had any falls.  He and his wife believe that his memory troubles have gradually worsened some over time, he comes into the office today for further evaluation.   REVIEW OF SYSTEMS: Out of a complete 14 system review of symptoms, the patient complains only of the following symptoms, and all other reviewed systems are negative.  See HPI  ALLERGIES: No Known Allergies  HOME MEDICATIONS: Outpatient Medications Prior to Visit  Medication Sig Dispense Refill   atorvastatin (LIPITOR) 40 MG tablet Take 40 mg by mouth daily.     busPIRone (BUSPAR) 7.5 MG tablet Take 7.5 mg by mouth 2 (two) times daily.     clopidogrel (PLAVIX) 75 MG tablet Take 1 tablet (75 mg total) by mouth daily. 30 tablet 3   diltiazem (TIAZAC) 240 MG 24 hr capsule Take 240 mg by mouth daily.      donepezil (ARICEPT) 10 MG tablet Take 1 tablet (10 mg total) by mouth at bedtime. 90 tablet 3   furosemide (LASIX) 20 MG tablet Take 10 mg by mouth daily.     glipiZIDE (GLUCOTROL) 10 MG tablet Take 2 tablets by mouth daily before  breakfast.      KLOR-CON M10 10 MEQ tablet Take 10 mEq by mouth daily.     Liraglutide (VICTOZA [place]) Inject into the skin.     lisinopril-hydrochlorothiazide (PRINZIDE,ZESTORETIC) 20-12.5 MG tablet Take 1 tablet by mouth daily.  1   memantine (NAMENDA) 10 MG tablet Take 1 tablet (10 mg total) by mouth 2 (two) times daily. 180 tablet 3   metFORMIN (GLUCOPHAGE) 1000 MG tablet Take 1,000 mg by mouth 2 (two) times daily with a meal.     niacin 500 MG tablet Take by mouth.     omeprazole  (PRILOSEC) 20 MG capsule Take 20 mg by mouth daily.  1   pioglitazone (ACTOS) 30 MG tablet Take 30 mg by mouth daily.      tamsulosin (FLOMAX) 0.4 MG CAPS capsule Take 1 tablet by mouth daily.     UNABLE TO FIND Med Name: calcium, magnesium, saw palmetto     No facility-administered medications prior to visit.    PAST MEDICAL HISTORY: Past Medical History:  Diagnosis Date   Anemia    Arthritis    osteoarthritis knees, elbow right   Carotid artery disease (HCC)    status post right carotid artery stenting 05/24/13   Complication of anesthesia    sensitive to anesthesia ,longer to awaken-"doesn't require much"   Complication of anesthesia    "can't sleep after anesthesia wears off and i become very anxious"    Diabetes mellitus, type 2 (HCC)    GERD (gastroesophageal reflux disease)    occ   History of bronchitis    History of pneumonia    History of tobacco abuse    Hyperlipidemia    Hypertension    Memory difficulty 01/16/2020   Nocturia    PAD (peripheral artery disease) (HCC) 06/22/2007   Bilateral iliac artery PTA and stenting with iCAST. Last lower extremity arterial Dopplers 02/25/2011 right ABI 1.0 left ABI 1.1.  Bilateral carotid artery stenosis. Last carotid Dopplers 07/18/2011: Max ICA stenosis 50-69% bilaterally   UTI (lower urinary tract infection)    Wears glasses     PAST SURGICAL HISTORY: Past Surgical History:  Procedure Laterality Date   APPENDECTOMY     ruptured as child taken out as adult   Bowl surgery  1989   to free stricture-part of colon removed from appendix issue   CARDIAC CATHETERIZATION     aortogram iliac artery stent   CAROTID ENDARTERECTOMY     CAROTID STENT Right 05/24/2013   CAROTID STENT INSERTION Right 05/24/2013   Procedure: CAROTID STENT INSERTION;  Surgeon: Nada Libman, MD;  Location[facility] CATH LAB;  Service: Cardiovascular;  Laterality: Right;   COLONOSCOPY     ENDARTERECTOMY Right 09/02/2012   Procedure: ENDARTERECTOMY CAROTID  with Resection of Common Carotid Artery;  Surgeon: Nada Libman, MD;  Location: [facility] OR;  Service: Vascular;  Laterality: Right;   ENDARTERECTOMY FEMORAL Right 08/30/2015   Procedure: RIGHT FEMORAL ENDARTERECTOMY;  Surgeon: Nada Libman, MD;  Location: [facility] OR;  Service: Vascular;  Laterality: Right;   LEG ANGIOGRAPHY  07/23/2015   1. abdominal aortogram, bilateral iliac angiogram, bifemoral runoff   LEG ANGIOGRAPHY  07/23/2015   2. PTA and covered stent right common iliac artery   NASAL POLYP SURGERY     PARTIAL KNEE ARTHROPLASTY Left 01/08/2015   Procedure: LEFT  UNI KNEE ARTHOLPLASTY MEDIALLY;  Surgeon: Durene Romans, MD;  Location: [facility] ORS;  Service: Orthopedics;  Laterality: Left;   PATCH ANGIOPLASTY Right 09/02/2012  Procedure: PATCH ANGIOPLASTY using Vascu-Guard Vascular Patch;  Surgeon: Nada Libman, MD;  Location: [facility] OR;  Service: Vascular;  Laterality: Right;leg   PATCH ANGIOPLASTY Right 08/30/2015   Procedure: PATCH ANGIOPLASTY;  Surgeon: Nada Libman, MD;  Location: [facility] OR;  Service: Vascular;  Laterality: Right;   PERIPHERAL VASCULAR CATHETERIZATION N/A 07/23/2015   Procedure: Lower Extremity Angiography;  Surgeon: Runell Gess, MD;  Location: [facility] INVASIVE CV LAB;  Service: Cardiovascular;  Laterality: N/A;   PERIPHERAL VASCULAR CATHETERIZATION Right 07/23/2015   Procedure: Peripheral Vascular Intervention;  Surgeon: Runell Gess, MD;  Location: [facility] INVASIVE CV LAB;  Service: Cardiovascular;  Laterality: Right;  Rt Common Iliac   TONSILLECTOMY     WRIST SURGERY Right    pinning    FAMILY HISTORY: Family History  Problem Relation Age of Onset   Diabetes Mother    Hypertension Mother    Cerebral aneurysm Mother    Multiple sclerosis Father    Kidney disease Father     SOCIAL HISTORY: Social History   Socioeconomic History   Marital status: Married    Spouse name: Bjorn Loser   Number of children: Not on file   Years of education: Not on file   Highest education  level: Not on file  Occupational History   Not on file  Tobacco Use   Smoking status: Former    Packs/day: 3.00    Years: 25.00    Additional pack years: 0.00    Total pack years: 75.00    Types: Cigarettes    Quit date: 08/06/1987    Years since quitting: 34.8   Smokeless tobacco: Never  Substance and Sexual Activity   Alcohol use: No   Drug use: No   Sexual activity: Not on file  Other Topics Concern   Not on file  Social History Narrative   Lives with wife   Right Handed   Drinks 6-8 cups caffeine daily   Social Determinants of Health   Financial Resource Strain: Not on file  Food Insecurity: Not on file  Transportation Needs: Not on file  Physical Activity: Not on file  Stress: Not on file  Social Connections: Not on file  Intimate Partner Violence: Not on file   PHYSICAL EXAM  There were no vitals filed for this visit.   There is no height or weight on file to calculate BMI.  Generalized: Well developed, in no acute distress     05/16/2021    1:34 PM 07/16/2020    1:32 PM 01/16/2020    4:08 PM  MMSE - Mini Mental State Exam  Orientation to time 3 4 4   Orientation to Place 4 5 5   Registration 3 3 3   Attention/ Calculation 4 5 5   Recall 0 2 2  Language- name 2 objects 2 2 2   Language- repeat 1 1 1   Language- follow 3 step command 3 3 3   Language- read & follow direction 0 1 1  Write a sentence 1 1 1   Copy design 1 1 1   Copy design-comments  Animals w/ four legs in one minute: 11 11 animals  Total score 22 28 28     Neurological examination  Mentation: Alert oriented to time, place, history taking. Follows all commands speech and language fluent, pleasant, in good spirits Cranial nerve II-XII: Pupils were equal round reactive to light. Extraocular movements were full, visual field were full on confrontational test. Facial sensation and strength were normal.  Head turning and shoulder shrug  were  normal and symmetric. Motor: The motor testing reveals 5  over 5 strength of all 4 extremities. Good symmetric motor tone is noted throughout.  Sensory: Sensory testing is intact to soft touch on all 4 extremities. No evidence of extinction is noted.  Coordination: Cerebellar testing reveals good finger-nose-finger and heel-to-shin bilaterally.  Gait and station: Gait is normal. Reflexes: Deep tendon reflexes are symmetric but depressed throughout   DIAGNOSTIC DATA (LABS, IMAGING, TESTING) - I reviewed patient records, labs, notes, testing and imaging myself where available.  Lab Results  Component Value Date   WBC 6.8 08/31/2015   HGB 11.1 (L) 08/31/2015   HCT 34.5 (L) 08/31/2015   MCV 88.5 08/31/2015   PLT 140 (L) 08/31/2015      Component Value Date/Time   NA 134 (L) 08/31/2015 0446   K 3.9 08/31/2015 0446   CL 97 (L) 08/31/2015 0446   CO2 30 08/31/2015 0446   GLUCOSE 165 (H) 08/31/2015 0446   BUN 11 08/31/2015 0446   CREATININE 0.84 08/31/2015 0446   CREATININE 0.74 07/18/2015 1035   CALCIUM 9.2 08/31/2015 0446   PROT 6.9 08/01/2020 1224   ALBUMIN 4.5 08/01/2020 1224   AST 27 08/01/2020 1224   ALT 24 08/01/2020 1224   ALKPHOS 59 08/01/2020 1224   BILITOT 0.4 08/01/2020 1224   GFRNONAA >60 08/31/2015 0446   GFRAA >60 08/31/2015 0446   Lab Results  Component Value Date   CHOL 137 08/01/2020   HDL 57 08/01/2020   LDLCALC 63 08/01/2020   TRIG 88 08/01/2020   CHOLHDL 2.4 08/01/2020   Lab Results  Component Value Date   HGBA1C 8.6 (H) 08/23/2015   Lab Results  Component Value Date   VITAMINB12 667 01/16/2020   Lab Results  Component Value Date   TSH 1.67 07/18/2015   ASSESSMENT AND PLAN 82 y.o. year old male  has a past medical history of Anemia, Arthritis, Carotid artery disease (HCC), Complication of anesthesia, Complication of anesthesia, Diabetes mellitus, type 2 (HCC), GERD (gastroesophageal reflux disease), History of bronchitis, History of pneumonia, History of tobacco abuse, Hyperlipidemia, Hypertension,  Memory difficulty (01/16/2020), Nocturia, PAD (peripheral artery disease) (HCC) (06/22/2007), UTI (lower urinary tract infection), and Wears glasses. here with:   Memory disturbance, Alzheimer's disease History of carotid artery disturbance, PVD  -Some decline in MMSE 22/30 today, was 28/30 -Continue Aricept 10 mg AM, Namenda 10 mg twice daily -I discussed I recommend him refrain from driving, I provided resources for driving evaluation -MRI of the brain showed mild atrophy, minimal white matter changes -Recommend exercise, activity, brain stimulating exercises, continue routine follow-up with PCP -Return to our office in 1 year or sooner if needed  Otila Kluver, DNP 05/21/2022, 4:18 PM [facility] [address] [place], [place] [address] [phone]

## 2022-05-22 ENCOUNTER — Encounter: Payer: Self-pay | Admitting: Neurology

## 2022-05-22 ENCOUNTER — Ambulatory Visit: Payer: Medicare HMO | Admitting: Neurology

## 2022-06-30 ENCOUNTER — Telehealth: Payer: Self-pay | Admitting: Cardiovascular Disease

## 2022-06-30 NOTE — Telephone Encounter (Signed)
Wife wants to reschedule patient's Echo test and will need new orders.  Wife wants to know if patient will still need to have all tests ordered done.

## 2022-06-30 NOTE — Telephone Encounter (Signed)
Spoke with pt's wife, William Burke (ok per [facility]) regarding need for Echocardiogram and for re-scheduling. Pt did no show his last appointment, at that time the order was completed. Order re-instated and pt rescheduled to have Echo. Wife verbalizes understanding.

## 2022-06-30 NOTE — Telephone Encounter (Signed)
Called spoke pt wife Bjorn Loser, [place] stated pt has canceled his echocardiogram several times due to other appointments. Ms. Bjorn Loser mentioned someone call to reschedule pt echocardiogram however, the pt will need a new order placed for the echo. Will forward to MD and nurse for advise.

## 2022-07-02 ENCOUNTER — Ambulatory Visit (HOSPITAL_COMMUNITY): Payer: Medicare HMO

## 2022-07-15 ENCOUNTER — Encounter (HOSPITAL_COMMUNITY): Payer: Medicare HMO

## 2022-07-18 ENCOUNTER — Encounter (HOSPITAL_COMMUNITY): Payer: Medicare HMO

## 2022-07-23 ENCOUNTER — Telehealth: Payer: Self-pay | Admitting: Neurology

## 2022-07-23 ENCOUNTER — Other Ambulatory Visit: Payer: Self-pay

## 2022-07-23 MED ORDER — DONEPEZIL HCL 10 MG PO TABS: 10.0000 mg | ORAL_TABLET | Freq: Every day | ORAL | 0 refills | Status: DC

## 2022-07-23 MED ORDER — MEMANTINE HCL 10 MG PO TABS: 10.0000 mg | ORAL_TABLET | Freq: Two times a day (BID) | ORAL | 0 refills | Status: DC

## 2022-07-23 NOTE — Progress Notes (Signed)
Refill request for donepezil and namenda sent to [id number]

## 2022-07-23 NOTE — Telephone Encounter (Signed)
Pt's wife request refills for donepezil (ARICEPT) 10 MG tablet and memantine (NAMENDA) 10 MG tablet to send to  [facility] [phone]

## 2022-07-25 ENCOUNTER — Ambulatory Visit (HOSPITAL_BASED_OUTPATIENT_CLINIC_OR_DEPARTMENT_OTHER)
Admission: RE | Admit: 2022-07-25 | Discharge: 2022-07-25 | Disposition: A | Discharge status: Home / Self Care | Payer: Medicare HMO | Source: Ambulatory Visit | Attending: Cardiovascular Disease | Admitting: Cardiovascular Disease

## 2022-07-25 ENCOUNTER — Ambulatory Visit (HOSPITAL_COMMUNITY)
Admission: RE | Admit: 2022-07-25 | Discharge: 2022-07-25 | Disposition: A | Discharge status: Home / Self Care | Payer: Medicare HMO | Source: Ambulatory Visit | Attending: Cardiovascular Disease | Admitting: Cardiovascular Disease

## 2022-07-25 DIAGNOSIS — Z95828 Presence of other vascular implants and grafts: Secondary | ICD-10-CM | POA: Insufficient documentation

## 2022-07-25 DIAGNOSIS — I739 Peripheral vascular disease, unspecified: Secondary | ICD-10-CM

## 2022-07-25 DIAGNOSIS — I779 Disorder of arteries and arterioles, unspecified: Secondary | ICD-10-CM | POA: Insufficient documentation

## 2022-07-25 DIAGNOSIS — I6523 Occlusion and stenosis of bilateral carotid arteries: Secondary | ICD-10-CM

## 2022-07-25 LAB — VAS US ABI WITH/WO TBI
Left ABI: 0.85
Right ABI: 0.96

## 2022-07-30 ENCOUNTER — Ambulatory Visit (HOSPITAL_COMMUNITY): Payer: Medicare HMO | Attending: Cardiovascular Disease

## 2022-07-30 DIAGNOSIS — R011 Cardiac murmur, unspecified: Secondary | ICD-10-CM

## 2022-07-30 DIAGNOSIS — I739 Peripheral vascular disease, unspecified: Secondary | ICD-10-CM | POA: Diagnosis present

## 2022-07-30 DIAGNOSIS — Z87891 Personal history of nicotine dependence: Secondary | ICD-10-CM | POA: Insufficient documentation

## 2022-07-30 DIAGNOSIS — I10 Essential (primary) hypertension: Secondary | ICD-10-CM | POA: Diagnosis present

## 2022-07-30 LAB — ECHOCARDIOGRAM COMPLETE
Area-P 1/2: 3.5 cm2
P 1/2 time: 473 msec
S' Lateral: 2.8 cm

## 2022-08-01 ENCOUNTER — Other Ambulatory Visit: Payer: Self-pay | Admitting: Neurology

## 2022-09-09 ENCOUNTER — Other Ambulatory Visit (HOSPITAL_BASED_OUTPATIENT_CLINIC_OR_DEPARTMENT_OTHER): Payer: Self-pay | Admitting: Cardiovascular Disease

## 2022-09-09 DIAGNOSIS — I739 Peripheral vascular disease, unspecified: Secondary | ICD-10-CM

## 2022-09-09 DIAGNOSIS — I6523 Occlusion and stenosis of bilateral carotid arteries: Secondary | ICD-10-CM

## 2022-10-27 ENCOUNTER — Other Ambulatory Visit: Payer: Self-pay | Admitting: Neurology

## 2022-10-30 ENCOUNTER — Other Ambulatory Visit: Payer: Self-pay

## 2022-10-30 ENCOUNTER — Encounter (HOSPITAL_COMMUNITY): Payer: Self-pay | Admitting: Emergency Medicine

## 2022-10-30 ENCOUNTER — Emergency Department (HOSPITAL_COMMUNITY): Payer: Medicare HMO

## 2022-10-30 ENCOUNTER — Inpatient Hospital Stay (HOSPITAL_COMMUNITY)
Admission: EM | Admit: 2022-10-30 | Discharge: 2022-11-01 | DRG: 100 | Disposition: A | Discharge status: Home Health | Payer: Medicare HMO | Attending: Family Medicine | Admitting: Family Medicine

## 2022-10-30 DIAGNOSIS — E119 Type 2 diabetes mellitus without complications: Secondary | ICD-10-CM | POA: Diagnosis not present

## 2022-10-30 DIAGNOSIS — J9602 Acute respiratory failure with hypercapnia: Secondary | ICD-10-CM | POA: Diagnosis present

## 2022-10-30 DIAGNOSIS — Z841 Family history of disorders of kidney and ureter: Secondary | ICD-10-CM

## 2022-10-30 DIAGNOSIS — J9601 Acute respiratory failure with hypoxia: Secondary | ICD-10-CM | POA: Diagnosis present

## 2022-10-30 DIAGNOSIS — I129 Hypertensive chronic kidney disease with stage 1 through stage 4 chronic kidney disease, or unspecified chronic kidney disease: Secondary | ICD-10-CM | POA: Diagnosis present

## 2022-10-30 DIAGNOSIS — I2510 Atherosclerotic heart disease of native coronary artery without angina pectoris: Secondary | ICD-10-CM | POA: Diagnosis present

## 2022-10-30 DIAGNOSIS — Z95820 Peripheral vascular angioplasty status with implants and grafts: Secondary | ICD-10-CM

## 2022-10-30 DIAGNOSIS — I4510 Unspecified right bundle-branch block: Secondary | ICD-10-CM | POA: Diagnosis present

## 2022-10-30 DIAGNOSIS — N179 Acute kidney failure, unspecified: Secondary | ICD-10-CM | POA: Diagnosis present

## 2022-10-30 DIAGNOSIS — I6523 Occlusion and stenosis of bilateral carotid arteries: Secondary | ICD-10-CM | POA: Diagnosis present

## 2022-10-30 DIAGNOSIS — E785 Hyperlipidemia, unspecified: Secondary | ICD-10-CM | POA: Diagnosis not present

## 2022-10-30 DIAGNOSIS — E1151 Type 2 diabetes mellitus with diabetic peripheral angiopathy without gangrene: Secondary | ICD-10-CM | POA: Diagnosis present

## 2022-10-30 DIAGNOSIS — E872 Acidosis, unspecified: Secondary | ICD-10-CM | POA: Diagnosis present

## 2022-10-30 DIAGNOSIS — R4182 Altered mental status, unspecified: Secondary | ICD-10-CM | POA: Diagnosis not present

## 2022-10-30 DIAGNOSIS — Z7984 Long term (current) use of oral hypoglycemic drugs: Secondary | ICD-10-CM

## 2022-10-30 DIAGNOSIS — I10 Essential (primary) hypertension: Secondary | ICD-10-CM | POA: Diagnosis present

## 2022-10-30 DIAGNOSIS — Z8249 Family history of ischemic heart disease and other diseases of the circulatory system: Secondary | ICD-10-CM

## 2022-10-30 DIAGNOSIS — R7989 Other specified abnormal findings of blood chemistry: Secondary | ICD-10-CM | POA: Diagnosis present

## 2022-10-30 DIAGNOSIS — I700 Atherosclerosis of aorta: Secondary | ICD-10-CM | POA: Diagnosis present

## 2022-10-30 DIAGNOSIS — F0280 Dementia in other diseases classified elsewhere without behavioral disturbance: Secondary | ICD-10-CM | POA: Diagnosis present

## 2022-10-30 DIAGNOSIS — R569 Unspecified convulsions: Secondary | ICD-10-CM | POA: Diagnosis not present

## 2022-10-30 DIAGNOSIS — Z96652 Presence of left artificial knee joint: Secondary | ICD-10-CM | POA: Diagnosis present

## 2022-10-30 DIAGNOSIS — Z794 Long term (current) use of insulin: Secondary | ICD-10-CM

## 2022-10-30 DIAGNOSIS — Z885 Allergy status to narcotic agent status: Secondary | ICD-10-CM

## 2022-10-30 DIAGNOSIS — Z833 Family history of diabetes mellitus: Secondary | ICD-10-CM

## 2022-10-30 DIAGNOSIS — R404 Transient alteration of awareness: Secondary | ICD-10-CM

## 2022-10-30 DIAGNOSIS — Z7902 Long term (current) use of antithrombotics/antiplatelets: Secondary | ICD-10-CM

## 2022-10-30 DIAGNOSIS — I708 Atherosclerosis of other arteries: Secondary | ICD-10-CM | POA: Diagnosis present

## 2022-10-30 DIAGNOSIS — Z8616 Personal history of COVID-19: Secondary | ICD-10-CM

## 2022-10-30 DIAGNOSIS — F0390 Unspecified dementia without behavioral disturbance: Secondary | ICD-10-CM | POA: Diagnosis present

## 2022-10-30 DIAGNOSIS — Z79899 Other long term (current) drug therapy: Secondary | ICD-10-CM

## 2022-10-30 DIAGNOSIS — Z87891 Personal history of nicotine dependence: Secondary | ICD-10-CM

## 2022-10-30 DIAGNOSIS — K219 Gastro-esophageal reflux disease without esophagitis: Secondary | ICD-10-CM | POA: Diagnosis present

## 2022-10-30 DIAGNOSIS — G309 Alzheimer's disease, unspecified: Secondary | ICD-10-CM | POA: Diagnosis present

## 2022-10-30 DIAGNOSIS — N400 Enlarged prostate without lower urinary tract symptoms: Secondary | ICD-10-CM | POA: Diagnosis present

## 2022-10-30 DIAGNOSIS — I739 Peripheral vascular disease, unspecified: Secondary | ICD-10-CM | POA: Diagnosis present

## 2022-10-30 DIAGNOSIS — R0902 Hypoxemia: Principal | ICD-10-CM

## 2022-10-30 DIAGNOSIS — N1832 Chronic kidney disease, stage 3b: Secondary | ICD-10-CM | POA: Diagnosis present

## 2022-10-30 DIAGNOSIS — E1122 Type 2 diabetes mellitus with diabetic chronic kidney disease: Secondary | ICD-10-CM | POA: Diagnosis present

## 2022-10-30 DIAGNOSIS — Z820 Family history of epilepsy and other diseases of the nervous system: Secondary | ICD-10-CM

## 2022-10-30 LAB — URINALYSIS, ROUTINE W REFLEX MICROSCOPIC
Bilirubin Urine: NEGATIVE
Glucose, UA: NEGATIVE mg/dL
Hgb urine dipstick: NEGATIVE
Ketones, ur: NEGATIVE mg/dL
Leukocytes,Ua: NEGATIVE
Nitrite: NEGATIVE
Protein, ur: NEGATIVE mg/dL
Specific Gravity, Urine: 1.026 (ref 1.005–1.030)
pH: 5 (ref 5.0–8.0)

## 2022-10-30 LAB — CBC WITH DIFFERENTIAL/PLATELET
Abs Immature Granulocytes: 0.17 10*3/uL — ABNORMAL HIGH (ref 0.00–0.07)
Basophils Absolute: 0 10*3/uL (ref 0.0–0.1)
Basophils Relative: 0 %
Eosinophils Absolute: 0.2 10*3/uL (ref 0.0–0.5)
Eosinophils Relative: 3 %
HCT: 34.2 % — ABNORMAL LOW (ref 39.0–52.0)
Hemoglobin: 10.9 g/dL — ABNORMAL LOW (ref 13.0–17.0)
Immature Granulocytes: 2 %
Lymphocytes Relative: 18 %
Lymphs Abs: 1.4 10*3/uL (ref 0.7–4.0)
MCH: 30.8 pg (ref 26.0–34.0)
MCHC: 31.9 g/dL (ref 30.0–36.0)
MCV: 96.6 fL (ref 80.0–100.0)
Monocytes Absolute: 0.6 10*3/uL (ref 0.1–1.0)
Monocytes Relative: 7 %
Neutro Abs: 5.6 10*3/uL (ref 1.7–7.7)
Neutrophils Relative %: 70 %
Platelets: 175 10*3/uL (ref 150–400)
RBC: 3.54 MIL/uL — ABNORMAL LOW (ref 4.22–5.81)
RDW: 13.2 % (ref 11.5–15.5)
WBC: 7.9 10*3/uL (ref 4.0–10.5)
nRBC: 0 % (ref 0.0–0.2)

## 2022-10-30 LAB — CBG MONITORING, ED
Glucose-Capillary: 264 mg/dL — ABNORMAL HIGH (ref 70–99)
Glucose-Capillary: 183 mg/dL — ABNORMAL HIGH (ref 70–99)

## 2022-10-30 LAB — RESP PANEL BY RT-PCR (RSV, FLU A&B, COVID)  RVPGX2
Influenza A by PCR: NEGATIVE
Influenza B by PCR: NEGATIVE
Resp Syncytial Virus by PCR: NEGATIVE
SARS Coronavirus 2 by RT PCR: NEGATIVE

## 2022-10-30 LAB — COMPREHENSIVE METABOLIC PANEL
ALT: 20 U/L (ref 0–44)
AST: 24 U/L (ref 15–41)
Albumin: 3.3 g/dL — ABNORMAL LOW (ref 3.5–5.0)
Alkaline Phosphatase: 54 U/L (ref 38–126)
Anion gap: 15 (ref 5–15)
BUN: 20 mg/dL (ref 8–23)
CO2: 21 mmol/L — ABNORMAL LOW (ref 22–32)
Calcium: 8.7 mg/dL — ABNORMAL LOW (ref 8.9–10.3)
Chloride: 103 mmol/L (ref 98–111)
Creatinine, Ser: 1.7 mg/dL — ABNORMAL HIGH (ref 0.61–1.24)
GFR, Estimated: 40 mL/min — ABNORMAL LOW (ref >60)
Glucose, Bld: 221 mg/dL — ABNORMAL HIGH (ref 70–99)
Potassium: 3.7 mmol/L (ref 3.5–5.1)
Sodium: 139 mmol/L (ref 135–145)
Total Bilirubin: 0.3 mg/dL (ref 0.3–1.2)
Total Protein: 6.5 g/dL (ref 6.5–8.1)

## 2022-10-30 LAB — TROPONIN I (HIGH SENSITIVITY)
Troponin I (High Sensitivity): 17 ng/L (ref <18)
Troponin I (High Sensitivity): 28 ng/L — ABNORMAL HIGH (ref <18)
Troponin I (High Sensitivity): 32 ng/L — ABNORMAL HIGH (ref <18)

## 2022-10-30 LAB — D-DIMER, QUANTITATIVE: D-Dimer, Quant: 5.18 ug{FEU}/mL — ABNORMAL HIGH (ref 0.00–0.50)

## 2022-10-30 LAB — I-STAT CG4 LACTIC ACID, ED
Lactic Acid, Venous: 6.3 mmol/L (ref 0.5–1.9)
Lactic Acid, Venous: 4.6 mmol/L (ref 0.5–1.9)

## 2022-10-30 MED ORDER — DILTIAZEM HCL ER COATED BEADS 120 MG PO CP24: 240.0000 mg | ORAL_CAPSULE | Freq: Every day | ORAL | Status: DC

## 2022-10-30 MED ORDER — ACETAMINOPHEN 325 MG PO TABS
650.0000 mg | ORAL_TABLET | Freq: Four times a day (QID) | ORAL | Status: DC | PRN
  Administered 2022-10-31: 650 mg via ORAL
  Filled 2022-10-30: qty 2

## 2022-10-30 MED ORDER — ONDANSETRON HCL 4 MG/2ML IJ SOLN
4.0000 mg | Freq: Once | INTRAMUSCULAR | Status: AC
  Administered 2022-10-30: 4 mg via INTRAVENOUS
  Filled 2022-10-30: qty 2

## 2022-10-30 MED ORDER — ACETAMINOPHEN 650 MG RE SUPP: 650.0000 mg | Freq: Four times a day (QID) | RECTAL | Status: DC | PRN

## 2022-10-30 MED ORDER — ONDANSETRON HCL 4 MG/2ML IJ SOLN: 4.0000 mg | Freq: Four times a day (QID) | INTRAMUSCULAR | Status: DC | PRN

## 2022-10-30 MED ORDER — TAMSULOSIN HCL 0.4 MG PO CAPS
0.4000 mg | ORAL_CAPSULE | Freq: Every day | ORAL | Status: DC
  Administered 2022-10-31 – 2022-11-01 (×2): 0.4 mg via ORAL
  Filled 2022-10-30 (×2): qty 1

## 2022-10-30 MED ORDER — SENNOSIDES-DOCUSATE SODIUM 8.6-50 MG PO TABS: 1.0000 | ORAL_TABLET | Freq: Every evening | ORAL | Status: DC | PRN

## 2022-10-30 MED ORDER — MEMANTINE HCL 10 MG PO TABS
10.0000 mg | ORAL_TABLET | Freq: Two times a day (BID) | ORAL | Status: DC
  Administered 2022-10-30 – 2022-11-01 (×4): 10 mg via ORAL
  Filled 2022-10-30 (×5): qty 1

## 2022-10-30 MED ORDER — INSULIN ASPART 100 UNIT/ML IJ SOLN: 0.0000 [IU] | Freq: Every day | INTRAMUSCULAR | Status: DC

## 2022-10-30 MED ORDER — ATORVASTATIN CALCIUM 40 MG PO TABS
40.0000 mg | ORAL_TABLET | Freq: Every day | ORAL | Status: DC
  Administered 2022-10-31 – 2022-11-01 (×2): 40 mg via ORAL
  Filled 2022-10-30 (×2): qty 1

## 2022-10-30 MED ORDER — SODIUM CHLORIDE 0.9 % IV BOLUS
1000.0000 mL | Freq: Once | INTRAVENOUS | Status: AC
  Administered 2022-10-30: 1000 mL via INTRAVENOUS

## 2022-10-30 MED ORDER — ASPIRIN 81 MG PO CHEW
324.0000 mg | CHEWABLE_TABLET | Freq: Once | ORAL | Status: AC
  Administered 2022-10-30: 324 mg via ORAL
  Filled 2022-10-30: qty 4

## 2022-10-30 MED ORDER — ACETAMINOPHEN 325 MG PO TABS
650.0000 mg | ORAL_TABLET | Freq: Once | ORAL | Status: AC
  Administered 2022-10-30: 650 mg via ORAL
  Filled 2022-10-30: qty 2

## 2022-10-30 MED ORDER — CLOPIDOGREL BISULFATE 75 MG PO TABS
75.0000 mg | ORAL_TABLET | Freq: Every day | ORAL | Status: DC
  Administered 2022-10-31 – 2022-11-01 (×2): 75 mg via ORAL
  Filled 2022-10-30 (×2): qty 1

## 2022-10-30 MED ORDER — PANTOPRAZOLE SODIUM 40 MG PO TBEC
40.0000 mg | DELAYED_RELEASE_TABLET | Freq: Every day | ORAL | Status: DC
  Administered 2022-10-31 – 2022-11-01 (×2): 40 mg via ORAL
  Filled 2022-10-30 (×2): qty 1

## 2022-10-30 MED ORDER — ONDANSETRON HCL 4 MG PO TABS: 4.0000 mg | ORAL_TABLET | Freq: Four times a day (QID) | ORAL | Status: DC | PRN

## 2022-10-30 MED ORDER — DONEPEZIL HCL 10 MG PO TABS
10.0000 mg | ORAL_TABLET | Freq: Every day | ORAL | Status: DC
  Administered 2022-10-30 – 2022-10-31 (×2): 10 mg via ORAL
  Filled 2022-10-30 (×2): qty 1

## 2022-10-30 MED ORDER — IOHEXOL 350 MG/ML SOLN
60.0000 mL | Freq: Once | INTRAVENOUS | Status: AC | PRN
  Administered 2022-10-30: 60 mL via INTRAVENOUS

## 2022-10-30 MED ORDER — INSULIN ASPART 100 UNIT/ML IJ SOLN
0.0000 [IU] | Freq: Three times a day (TID) | INTRAMUSCULAR | Status: DC
  Administered 2022-10-31: 2 [IU] via SUBCUTANEOUS
  Administered 2022-10-31: 3 [IU] via SUBCUTANEOUS
  Administered 2022-10-31 – 2022-11-01 (×2): 2 [IU] via SUBCUTANEOUS
  Administered 2022-11-01: 3 [IU] via SUBCUTANEOUS

## 2022-10-30 MED ORDER — INSULIN GLARGINE-YFGN 100 UNIT/ML ~~LOC~~ SOLN
18.0000 [IU] | Freq: Every day | SUBCUTANEOUS | Status: DC
  Administered 2022-10-31 – 2022-11-01 (×2): 18 [IU] via SUBCUTANEOUS
  Filled 2022-10-30 (×2): qty 0.18

## 2022-10-30 MED ORDER — SODIUM CHLORIDE 0.9 % IV SOLN: INTRAVENOUS | Status: DC

## 2022-10-30 MED ORDER — HEPARIN SODIUM (PORCINE) 5000 UNIT/ML IJ SOLN
5000.0000 [IU] | Freq: Three times a day (TID) | INTRAMUSCULAR | Status: DC
  Administered 2022-10-30 – 2022-11-01 (×5): 5000 [IU] via SUBCUTANEOUS
  Filled 2022-10-30 (×5): qty 1

## 2022-10-30 MED ORDER — SACCHAROMYCES BOULARDII 250 MG PO CAPS
250.0000 mg | ORAL_CAPSULE | Freq: Two times a day (BID) | ORAL | Status: DC
  Administered 2022-10-30 – 2022-11-01 (×4): 250 mg via ORAL
  Filled 2022-10-30 (×4): qty 1

## 2022-10-30 NOTE — ED Notes (Signed)
With help from the NT, the pt stood at the bedside to use the urinal. The pt then complained of some nausea once he was back in bed. Sent secure chat to MD to request nausea medication.

## 2022-10-30 NOTE — ED Notes (Signed)
ED TO INPATIENT HANDOFF REPORT  ED Nurse Name and Phone #: Hurshel Keys [phone]  S Name/Age/Gender [name] 82 y.o. male Room/Bed: 029C/029C  Code Status   Code Status: Prior  Home/SNF/Other Home Patient oriented to: self and place and time, doesn't remember situation Is this baseline? Yes   Triage Complete: Triage complete  Chief Complaint resp distress - now breathing  Triage Note Pt arrives via EMS from private residence. EMS states pt found by wife, unresponsive- wife initated CPR. Fire arrived to residence, pt had a pulse, but was not breathing independently. Fire bagged pt, EMS states pt then sating 85% on RA  breathing independently. Pt alert to self. Pt with hx dementia, respirations even and non-labored.  Pupils pinpoint per EMS, administered 0.5 mg Narcan without significant change in presentation. Pt 94% on 3L [place].  EMS states pt combative upon arrival.  Last normal at 2pm.    Allergies No Known Allergies  Level of Care/Admitting Diagnosis ED Disposition     None       B Medical/Surgery History Past Medical History:  Diagnosis Date   Anemia    Arthritis    osteoarthritis knees, elbow right   Carotid artery disease (HCC)    status post right carotid artery stenting 05/24/13   Complication of anesthesia    sensitive to anesthesia ,longer to awaken-"doesn't require much"   Complication of anesthesia    "can't sleep after anesthesia wears off and i become very anxious"    Diabetes mellitus, type 2 (HCC)    GERD (gastroesophageal reflux disease)    occ   History of bronchitis    History of pneumonia    History of tobacco abuse    Hyperlipidemia    Hypertension    Memory difficulty 01/16/2020   Nocturia    PAD (peripheral artery disease) (HCC) 06/22/2007   Bilateral iliac artery PTA and stenting with iCAST. Last lower extremity arterial Dopplers 02/25/2011 right ABI 1.0 left ABI 1.1.  Bilateral carotid artery stenosis. Last carotid Dopplers  07/18/2011: Max ICA stenosis 50-69% bilaterally   UTI (lower urinary tract infection)    Wears glasses    Past Surgical History:  Procedure Laterality Date   APPENDECTOMY     ruptured as child taken out as adult   Bowl surgery  1989   to free stricture-part of colon removed from appendix issue   CARDIAC CATHETERIZATION     aortogram iliac artery stent   CAROTID ENDARTERECTOMY     CAROTID STENT Right 05/24/2013   CAROTID STENT INSERTION Right 05/24/2013   Procedure: CAROTID STENT INSERTION;  Surgeon: Nada Libman, MD;  Location: [facility] CATH LAB;  Service: Cardiovascular;  Laterality: Right;   COLONOSCOPY     ENDARTERECTOMY Right 09/02/2012   Procedure: ENDARTERECTOMY CAROTID with Resection of Common Carotid Artery;  Surgeon: Nada Libman, MD;  Location: [facility] OR;  Service: Vascular;  Laterality: Right;   ENDARTERECTOMY FEMORAL Right 08/30/2015   Procedure: RIGHT FEMORAL ENDARTERECTOMY;  Surgeon: Nada Libman, MD;  Location: [facility] OR;  Service: Vascular;  Laterality: Right;   LEG ANGIOGRAPHY  07/23/2015   1. abdominal aortogram, bilateral iliac angiogram, bifemoral runoff   LEG ANGIOGRAPHY  07/23/2015   2. PTA and covered stent right common iliac artery   NASAL POLYP SURGERY     PARTIAL KNEE ARTHROPLASTY Left 01/08/2015   Procedure: LEFT  UNI KNEE ARTHOLPLASTY MEDIALLY;  Surgeon: Durene Romans, MD;  Location: [facility] ORS;  Service: Orthopedics;  Laterality: Left;  PATCH ANGIOPLASTY Right 09/02/2012   Procedure: PATCH ANGIOPLASTY using Vascu-Guard Vascular Patch;  Surgeon: Nada Libman, MD;  Location: [facility] OR;  Service: Vascular;  Laterality: Right;leg   PATCH ANGIOPLASTY Right 08/30/2015   Procedure: PATCH ANGIOPLASTY;  Surgeon: Nada Libman, MD;  Location: [facility] OR;  Service: Vascular;  Laterality: Right;   PERIPHERAL VASCULAR CATHETERIZATION N/A 07/23/2015   Procedure: Lower Extremity Angiography;  Surgeon: Runell Gess, MD;  Location: [facility] INVASIVE CV LAB;  Service: Cardiovascular;  Laterality:  N/A;   PERIPHERAL VASCULAR CATHETERIZATION Right 07/23/2015   Procedure: Peripheral Vascular Intervention;  Surgeon: Runell Gess, MD;  Location: [facility] INVASIVE CV LAB;  Service: Cardiovascular;  Laterality: Right;  Rt Common Iliac   TONSILLECTOMY     WRIST SURGERY Right    pinning     A IV Location/Drains/Wounds Patient Lines/Drains/Airways Status     Active Line/Drains/Airways     Name Placement date Placement time Site Days   Peripheral IV 10/30/22 20 G 1" Distal;Posterior;Right Forearm 10/30/22  1611  Forearm  less than 1            Intake/Output Last 24 hours  Intake/Output Summary (Last 24 hours) at 10/30/2022 2056 Last data filed at 10/30/2022 1532 Gross per 24 hour  Intake 0 ml  Output --  Net 0 ml    Labs/Imaging Results for orders placed or performed during the hospital encounter of 10/30/22 (from the past 48 hour(s))  Comprehensive metabolic panel     Status: Abnormal   Collection Time: 10/30/22  3:38 PM  Result Value Ref Range   Sodium 139 135 - 145 mmol/L   Potassium 3.7 3.5 - 5.1 mmol/L   Chloride 103 98 - 111 mmol/L   CO2 21 (L) 22 - 32 mmol/L   Glucose, Bld 221 (H) 70 - 99 mg/dL    Comment: Glucose reference range applies only to samples taken after fasting for at least 8 hours.   BUN 20 8 - 23 mg/dL   Creatinine, Ser 8.11 (H) 0.61 - 1.24 mg/dL   Calcium 8.7 (L) 8.9 - 10.3 mg/dL   Total Protein 6.5 6.5 - 8.1 g/dL   Albumin 3.3 (L) 3.5 - 5.0 g/dL   AST 24 15 - 41 U/L   ALT 20 0 - 44 U/L   Alkaline Phosphatase 54 38 - 126 U/L   Total Bilirubin 0.3 0.3 - 1.2 mg/dL   GFR, Estimated 40 (L) >60 mL/min    Comment: (NOTE) Calculated using the CKD-EPI Creatinine Equation (2021)    Anion gap 15 5 - 15    Comment: Performed at [facility], [address], [place], [place] [address]  Troponin I (High Sensitivity)     Status: None   Collection Time: 10/30/22  3:38 PM  Result Value Ref Range   Troponin I (High Sensitivity) 17 <18 ng/L    Comment:  (NOTE) Elevated high sensitivity troponin I (hsTnI) values and significant  changes across serial measurements may suggest ACS but many other  chronic and acute conditions are known to elevate hsTnI results.  Refer to the "Links" section for chest pain algorithms and additional  guidance. Performed at [facility], [address] [place], [place] [address]   CBC with Differential     Status: Abnormal   Collection Time: 10/30/22  3:38 PM  Result Value Ref Range   WBC 7.9 4.0 - 10.5 K/uL   RBC 3.54 (L) 4.22 - 5.81 MIL/uL   Hemoglobin  10.9 (L) 13.0 - 17.0 g/dL   HCT 16.1 (L) 09.6 - 04.5 %   MCV 96.6 80.0 - 100.0 fL   MCH 30.8 26.0 - 34.0 pg   MCHC 31.9 30.0 - 36.0 g/dL   RDW 40.9 81.1 - 91.4 %   Platelets 175 150 - 400 K/uL   nRBC 0.0 0.0 - 0.2 %   Neutrophils Relative % 70 %   Neutro Abs 5.6 1.7 - 7.7 K/uL   Lymphocytes Relative 18 %   Lymphs Abs 1.4 0.7 - 4.0 K/uL   Monocytes Relative 7 %   Monocytes Absolute 0.6 0.1 - 1.0 K/uL   Eosinophils Relative 3 %   Eosinophils Absolute 0.2 0.0 - 0.5 K/uL   Basophils Relative 0 %   Basophils Absolute 0.0 0.0 - 0.1 K/uL   Immature Granulocytes 2 %   Abs Immature Granulocytes 0.17 (H) 0.00 - 0.07 K/uL    Comment: Performed at [facility], [address], [place], [place] [address]  D-dimer, quantitative     Status: Abnormal   Collection Time: 10/30/22  3:38 PM  Result Value Ref Range   D-Dimer, Quant 5.18 (H) 0.00 - 0.50 ug/mL-FEU    Comment: (NOTE) At the manufacturer cut-off value of 0.5 g/mL FEU, this assay has a negative predictive value of 95-100%.This assay is intended for use in conjunction with a clinical pretest probability (PTP) assessment model to exclude pulmonary embolism (PE) and deep venous thrombosis (DVT) in outpatients suspected of PE or DVT. Results should be correlated with clinical presentation. Performed at [facility], [address], [place], [place] [address]   I-Stat CG4 Lactic Acid      Status: Abnormal   Collection Time: 10/30/22  3:56 PM  Result Value Ref Range   Lactic Acid, Venous 6.3 (HH) 0.5 - 1.9 mmol/L   Comment NOTIFIED PHYSICIAN   Urinalysis, Routine w reflex microscopic -Urine, Clean Catch     Status: None   Collection Time: 10/30/22  6:08 PM  Result Value Ref Range   Color, Urine YELLOW YELLOW   APPearance CLEAR CLEAR   Specific Gravity, Urine 1.026 1.005 - 1.030   pH 5.0 5.0 - 8.0   Glucose, UA NEGATIVE NEGATIVE mg/dL   Hgb urine dipstick NEGATIVE NEGATIVE   Bilirubin Urine NEGATIVE NEGATIVE   Ketones, ur NEGATIVE NEGATIVE mg/dL   Protein, ur NEGATIVE NEGATIVE mg/dL   Nitrite NEGATIVE NEGATIVE   Leukocytes,Ua NEGATIVE NEGATIVE    Comment: Performed at [facility], [address] [place], [place] [address]  Troponin I (High Sensitivity)     Status: Abnormal   Collection Time: 10/30/22  6:22 PM  Result Value Ref Range   Troponin I (High Sensitivity) 28 (H) <18 ng/L    Comment: (NOTE) Elevated high sensitivity troponin I (hsTnI) values and significant  changes across serial measurements may suggest ACS but many other  chronic and acute conditions are known to elevate hsTnI results.  Refer to the "Links" section for chest pain algorithms and additional  guidance. Performed at [facility], [address], [place], [place] [address]   I-Stat CG4 Lactic Acid     Status: Abnormal   Collection Time: 10/30/22  6:41 PM  Result Value Ref Range   Lactic Acid, Venous 4.6 (HH) 0.5 - 1.9 mmol/L   Comment NOTIFIED PHYSICIAN   POC CBG, ED     Status: Abnormal   Collection Time: 10/30/22  7:58 PM  Result Value Ref  Range   Glucose-Capillary 264 (H) 70 - 99 mg/dL    Comment: Glucose reference range applies only to samples taken after fasting for at least 8 hours.  Troponin I (High Sensitivity)     Status: Abnormal   Collection Time: 10/30/22  8:03 PM  Result Value Ref Range   Troponin I (High Sensitivity) 32 (H) <18 ng/L    Comment:  (NOTE) Elevated high sensitivity troponin I (hsTnI) values and significant  changes across serial measurements may suggest ACS but many other  chronic and acute conditions are known to elevate hsTnI results.  Refer to the "Links" section for chest pain algorithms and additional  guidance. Performed at [facility], [address] [place], [place] [address]    CT Angio Chest PE W and/or Wo Contrast  Result Date: 10/30/2022 CLINICAL DATA:  Pulmonary embolus suspected with low to intermediate probability. Positive D-dimer. Hypoxia. EXAM: CT ANGIOGRAPHY CHEST WITH CONTRAST TECHNIQUE: Multidetector CT imaging of the chest was performed using the standard protocol during bolus administration of intravenous contrast. Multiplanar CT image reconstructions and MIPs were obtained to evaluate the vascular anatomy. RADIATION DOSE REDUCTION: This exam was performed according to the departmental dose-optimization program which includes automated exposure control, adjustment of the mA and/or kV according to patient size and/or use of iterative reconstruction technique. CONTRAST:  60mL OMNIPAQUE IOHEXOL 350 MG/ML SOLN COMPARISON:  Chest radiograph 10/30/2022 FINDINGS: Cardiovascular: Good opacification of the central and segmental pulmonary arteries with mild motion artifact no focal filling defects. No evidence of significant pulmonary embolus. Dilated central pulmonary arteries may indicate pulmonary arterial hypertension in the appropriate clinical setting. Normal heart size. No pericardial effusions. Calcification of the aorta, coronary arteries, and mitral valve annulus. Mediastinum/Nodes: Thyroid gland is unremarkable. Esophagus is decompressed. No significant lymphadenopathy. Lungs/Pleura: Motion artifact limits examination. There is atelectasis in the lung bases. No pleural effusions or pneumothorax. Upper Abdomen: No acute abnormalities. Prominent aortic calcification with calcific stenosis of the aorta  at the level of the renal arteries representing a proximally 80% diameter reduction. Musculoskeletal: Degenerative changes in the spine. Old rib fractures. No acute bony abnormalities. Review of the MIP images confirms the above findings. IMPRESSION: 1. No evidence of significant pulmonary embolus. 2. Atelectasis in the lung bases. 3. Dilated main pulmonary artery may indicate pulmonary arterial hypertension. 4. Extensive aortic atherosclerosis with evidence of 80% calcific stenosis of the upper abdominal aorta. Electronically Signed   By: Burman Nieves M.D.   On: 10/30/2022 19:10   CT Head Wo Contrast  Result Date: 10/30/2022 CLINICAL DATA:  Found unresponsive EXAM: CT HEAD WITHOUT CONTRAST CT CERVICAL SPINE WITHOUT CONTRAST TECHNIQUE: Multidetector CT imaging of the head and cervical spine was performed following the standard protocol without intravenous contrast. Multiplanar CT image reconstructions of the cervical spine were also generated. RADIATION DOSE REDUCTION: This exam was performed according to the departmental dose-optimization program which includes automated exposure control, adjustment of the mA and/or kV according to patient size and/or use of iterative reconstruction technique. COMPARISON:  None are available. FINDINGS: CT HEAD FINDINGS Brain: Motion obscures fine detail. No intracranial hemorrhage, mass effect, or evidence of acute infarct. No hydrocephalus. No extra-axial fluid collection. Generalized cerebral atrophy and chronic small vessel ischemic disease. Vascular: No hyperdense vessel. Intracranial arterial calcification. Skull: No fracture or focal lesion. Sinuses/Orbits: Mild mucosal thickening in the paranasal sinuses. Other: Impacted left maxillary lateral incisor with periapical lucency. CT CERVICAL SPINE FINDINGS Alignment: No evidence of traumatic malalignment. Skull base and vertebrae: No  acute fracture. No primary bone lesion or focal pathologic process. Soft tissues and  spinal canal: No prevertebral fluid or swelling. No visible canal hematoma. Disc levels: Multilevel age-related degenerative spondylosis. No severe spinal canal narrowing. Upper chest: See separate report. Other: Carotid calcification. IMPRESSION: 1. No acute intracranial abnormality. Generalized atrophy and small vessel white matter disease. 2. No acute fracture in the cervical spine. Multilevel degenerative spondylosis. Electronically Signed   By: Minerva Fester M.D.   On: 10/30/2022 18:49   CT Cervical Spine Wo Contrast  Result Date: 10/30/2022 CLINICAL DATA:  Found unresponsive EXAM: CT HEAD WITHOUT CONTRAST CT CERVICAL SPINE WITHOUT CONTRAST TECHNIQUE: Multidetector CT imaging of the head and cervical spine was performed following the standard protocol without intravenous contrast. Multiplanar CT image reconstructions of the cervical spine were also generated. RADIATION DOSE REDUCTION: This exam was performed according to the departmental dose-optimization program which includes automated exposure control, adjustment of the mA and/or kV according to patient size and/or use of iterative reconstruction technique. COMPARISON:  None are available. FINDINGS: CT HEAD FINDINGS Brain: Motion obscures fine detail. No intracranial hemorrhage, mass effect, or evidence of acute infarct. No hydrocephalus. No extra-axial fluid collection. Generalized cerebral atrophy and chronic small vessel ischemic disease. Vascular: No hyperdense vessel. Intracranial arterial calcification. Skull: No fracture or focal lesion. Sinuses/Orbits: Mild mucosal thickening in the paranasal sinuses. Other: Impacted left maxillary lateral incisor with periapical lucency. CT CERVICAL SPINE FINDINGS Alignment: No evidence of traumatic malalignment. Skull base and vertebrae: No acute fracture. No primary bone lesion or focal pathologic process. Soft tissues and spinal canal: No prevertebral fluid or swelling. No visible canal hematoma. Disc  levels: Multilevel age-related degenerative spondylosis. No severe spinal canal narrowing. Upper chest: See separate report. Other: Carotid calcification. IMPRESSION: 1. No acute intracranial abnormality. Generalized atrophy and small vessel white matter disease. 2. No acute fracture in the cervical spine. Multilevel degenerative spondylosis. Electronically Signed   By: Minerva Fester M.D.   On: 10/30/2022 18:49   DG Chest Portable 1 View  Result Date: 10/30/2022 CLINICAL DATA:  Pneumonia and shortness of breath EXAM: PORTABLE CHEST 1 VIEW COMPARISON:  Radiographs 08/17/2015 FINDINGS: Low lung volumes accentuate cardiomediastinal silhouette and pulmonary vascularity. Fat pad at the cardiac apex. Left-greater-than-right basilar atelectasis/scarring. No definite pleural effusion. No pneumothorax. No displaced rib fractures. IMPRESSION: Low lung volumes with left-greater-than-right basilar atelectasis/scarring. Electronically Signed   By: Minerva Fester M.D.   On: 10/30/2022 17:46    Pending Labs Unresulted Labs (From admission, onward)     Start     Ordered   10/30/22 1541  Culture, blood (routine x 2)  BLOOD CULTURE X 2,   R (with STAT occurrences)      10/30/22 1541            Vitals/Pain Today's Vitals   10/30/22 1800 10/30/22 2007 10/30/22 2008 10/30/22 2053  BP: (!) 148/72  (!) 188/78   Pulse: 80 92 93   Resp: 20 18    Temp:   97.8 F (36.6 C)   TempSrc:   Oral   SpO2: 99% 98% 98%   Weight:      Height:      PainSc:    5     Isolation Precautions No active isolations  Medications Medications  iohexol (OMNIPAQUE) 350 MG/ML injection 60 mL (60 mLs Intravenous Contrast Given 10/30/22 1724)  aspirin chewable tablet 324 mg (324 mg Oral Given 10/30/22 1805)  acetaminophen (TYLENOL) tablet 650 mg (650 mg Oral Given 10/30/22  1806)  ondansetron [facility]) injection 4 mg (4 mg Intravenous Given 10/30/22 2039)    Mobility walks with device     Focused Assessments Cardiac  Assessment Handoff:  Cardiac Rhythm: Normal sinus rhythm No results found for: "CKTOTAL", "CKMB", "CKMBINDEX", "TROPONINI" Lab Results  Component Value Date   DDIMER 5.18 (H) 10/30/2022   Does the Patient currently have chest pain? No   , Neuro Assessment Handoff:  Swallow screen pass? Yes  Cardiac Rhythm: Normal sinus rhythm       Neuro Assessment:   Neuro Checks:      Has TPA been given? No If patient is a Neuro Trauma and patient is going to OR before floor call report to 4N Charge nurse: [phone] or [phone]  , Pulmonary Assessment Handoff:  Lung sounds: Bilateral Breath Sounds: Diminished O2 Device: Nasal Cannula O2 Flow Rate (L/min): 2 L/min    R Recommendations: See Admitting Provider Note  Report given to:   Additional Notes:

## 2022-10-30 NOTE — ED Notes (Signed)
ED TO INPATIENT HANDOFF REPORT  ED Nurse Name and Phone #: Amil Amen [phone]  S Name/Age/Gender William Burke 82 y.o. male Room/Bed: 003C/003C  Code Status   Code Status: Full Code  Home/SNF/Other Home Patient oriented to: self Is this baseline? Yes   Triage Complete: Triage complete  Chief Complaint Acute respiratory failure with hypoxia (HCC) [J96.01]  Triage Note Pt arrives via EMS from private residence. EMS states pt found by wife, unresponsive- wife initated CPR. Fire arrived to residence, pt had a pulse, but was not breathing independently. Fire bagged pt, EMS states pt then sating 85% on RA  breathing independently. Pt alert to self. Pt with hx dementia, respirations even and non-labored.  Pupils pinpoint per EMS, administered 0.5 mg Narcan without significant change in presentation. Pt 94% on 3L [place].  EMS states pt combative upon arrival.  Last normal at 2pm.    Allergies Allergies  Allergen Reactions   Norco [Hydrocodone-Acetaminophen] Other (See Comments)    Pt wife states intolerance to stronger norco meds    Level of Care/Admitting Diagnosis ED Disposition     ED Disposition  Admit   Condition  --   Comment  Hospital Area: [facility] [[id number]]  Level of Care: Telemetry Medical [104]  May place patient in observation at [facility] or [facility] if equivalent level of care is available:: No  Covid Evaluation: Asymptomatic - no recent exposure (last 10 days) testing not required  Diagnosis: Acute respiratory failure with hypoxia [facility]) [[id number]]  Admitting Physician: Gery Pray [[id number]]  Attending Physician: Alvester Chou          B Medical/Surgery History Past Medical History:  Diagnosis Date   Anemia    Arthritis    osteoarthritis knees, elbow right   Carotid artery disease (HCC)    status post right carotid artery stenting 05/24/13   Complication of anesthesia    sensitive to anesthesia ,longer to awaken-"doesn't  require much"   Complication of anesthesia    "can't sleep after anesthesia wears off and i become very anxious"    Diabetes mellitus, type 2 (HCC)    GERD (gastroesophageal reflux disease)    occ   History of bronchitis    History of pneumonia    History of tobacco abuse    Hyperlipidemia    Hypertension    Memory difficulty 01/16/2020   Nocturia    PAD (peripheral artery disease) (HCC) 06/22/2007   Bilateral iliac artery PTA and stenting with iCAST. Last lower extremity arterial Dopplers 02/25/2011 right ABI 1.0 left ABI 1.1.  Bilateral carotid artery stenosis. Last carotid Dopplers 07/18/2011: Max ICA stenosis 50-69% bilaterally   UTI (lower urinary tract infection)    Wears glasses    Past Surgical History:  Procedure Laterality Date   APPENDECTOMY     ruptured as child taken out as adult   Bowl surgery  1989   to free stricture-part of colon removed from appendix issue   CARDIAC CATHETERIZATION     aortogram iliac artery stent   CAROTID ENDARTERECTOMY     CAROTID STENT Right 05/24/2013   CAROTID STENT INSERTION Right 05/24/2013   Procedure: CAROTID STENT INSERTION;  Surgeon: Nada Libman, MD;  Location: [facility] CATH LAB;  Service: Cardiovascular;  Laterality: Right;   COLONOSCOPY     ENDARTERECTOMY Right 09/02/2012   Procedure: ENDARTERECTOMY CAROTID with Resection of Common Carotid Artery;  Surgeon: Nada Libman, MD;  Location: [facility] OR;  Service: Vascular;  Laterality: Right;  ENDARTERECTOMY FEMORAL Right 08/30/2015   Procedure: RIGHT FEMORAL ENDARTERECTOMY;  Surgeon: Nada Libman, MD;  Location: [facility] OR;  Service: Vascular;  Laterality: Right;   LEG ANGIOGRAPHY  07/23/2015   1. abdominal aortogram, bilateral iliac angiogram, bifemoral runoff   LEG ANGIOGRAPHY  07/23/2015   2. PTA and covered stent right common iliac artery   NASAL POLYP SURGERY     PARTIAL KNEE ARTHROPLASTY Left 01/08/2015   Procedure: LEFT  UNI KNEE ARTHOLPLASTY MEDIALLY;  Surgeon: Durene Romans, MD;   Location: [facility] ORS;  Service: Orthopedics;  Laterality: Left;   PATCH ANGIOPLASTY Right 09/02/2012   Procedure: PATCH ANGIOPLASTY using Vascu-Guard Vascular Patch;  Surgeon: Nada Libman, MD;  Location: [facility] OR;  Service: Vascular;  Laterality: Right;leg   PATCH ANGIOPLASTY Right 08/30/2015   Procedure: PATCH ANGIOPLASTY;  Surgeon: Nada Libman, MD;  Location: [facility] OR;  Service: Vascular;  Laterality: Right;   PERIPHERAL VASCULAR CATHETERIZATION N/A 07/23/2015   Procedure: Lower Extremity Angiography;  Surgeon: Runell Gess, MD;  Location: [facility] INVASIVE CV LAB;  Service: Cardiovascular;  Laterality: N/A;   PERIPHERAL VASCULAR CATHETERIZATION Right 07/23/2015   Procedure: Peripheral Vascular Intervention;  Surgeon: Runell Gess, MD;  Location: [facility] INVASIVE CV LAB;  Service: Cardiovascular;  Laterality: Right;  Rt Common Iliac   TONSILLECTOMY     WRIST SURGERY Right    pinning     A IV Location/Drains/Wounds Patient Lines/Drains/Airways Status     Active Line/Drains/Airways     Name Placement date Placement time Site Days   Peripheral IV 10/30/22 20 G 1" Distal;Posterior;Right Forearm 10/30/22  1611  Forearm  less than 1            Intake/Output Last 24 hours  Intake/Output Summary (Last 24 hours) at 10/30/2022 2351 Last data filed at 10/30/2022 2330 Gross per 24 hour  Intake 1000.21 ml  Output --  Net 1000.21 ml    Labs/Imaging Results for orders placed or performed during the hospital encounter of 10/30/22 (from the past 48 hour(s))  Comprehensive metabolic panel     Status: Abnormal   Collection Time: 10/30/22  3:38 PM  Result Value Ref Range   Sodium 139 135 - 145 mmol/L   Potassium 3.7 3.5 - 5.1 mmol/L   Chloride 103 98 - 111 mmol/L   CO2 21 (L) 22 - 32 mmol/L   Glucose, Bld 221 (H) 70 - 99 mg/dL    Comment: Glucose reference range applies only to samples taken after fasting for at least 8 hours.   BUN 20 8 - 23 mg/dL   Creatinine, Ser 1.61 (H) 0.61 - 1.24 mg/dL    Calcium 8.7 (L) 8.9 - 10.3 mg/dL   Total Protein 6.5 6.5 - 8.1 g/dL   Albumin 3.3 (L) 3.5 - 5.0 g/dL   AST 24 15 - 41 U/L   ALT 20 0 - 44 U/L   Alkaline Phosphatase 54 38 - 126 U/L   Total Bilirubin 0.3 0.3 - 1.2 mg/dL   GFR, Estimated 40 (L) >60 mL/min    Comment: (NOTE) Calculated using the CKD-EPI Creatinine Equation (2021)    Anion gap 15 5 - 15    Comment: Performed at [facility], [address] [place], [place] [address]  Troponin I (High Sensitivity)     Status: None   Collection Time: 10/30/22  3:38 PM  Result Value Ref Range   Troponin I (High Sensitivity) 17 <18 ng/L    Comment: (NOTE) Elevated high sensitivity  troponin I (hsTnI) values and significant  changes across serial measurements may suggest ACS but many other  chronic and acute conditions are known to elevate hsTnI results.  Refer to the "Links" section for chest pain algorithms and additional  guidance. Performed at [facility], [address] [place], [place] [address]   CBC with Differential     Status: Abnormal   Collection Time: 10/30/22  3:38 PM  Result Value Ref Range   WBC 7.9 4.0 - 10.5 K/uL   RBC 3.54 (L) 4.22 - 5.81 MIL/uL   Hemoglobin 10.9 (L) 13.0 - 17.0 g/dL   HCT 91.4 (L) 78.2 - 95.6 %   MCV 96.6 80.0 - 100.0 fL   MCH 30.8 26.0 - 34.0 pg   MCHC 31.9 30.0 - 36.0 g/dL   RDW 21.3 08.6 - 57.8 %   Platelets 175 150 - 400 K/uL   nRBC 0.0 0.0 - 0.2 %   Neutrophils Relative % 70 %   Neutro Abs 5.6 1.7 - 7.7 K/uL   Lymphocytes Relative 18 %   Lymphs Abs 1.4 0.7 - 4.0 K/uL   Monocytes Relative 7 %   Monocytes Absolute 0.6 0.1 - 1.0 K/uL   Eosinophils Relative 3 %   Eosinophils Absolute 0.2 0.0 - 0.5 K/uL   Basophils Relative 0 %   Basophils Absolute 0.0 0.0 - 0.1 K/uL   Immature Granulocytes 2 %   Abs Immature Granulocytes 0.17 (H) 0.00 - 0.07 K/uL    Comment: Performed at [facility], [address], [place], [place] [address]  D-dimer, quantitative     Status:  Abnormal   Collection Time: 10/30/22  3:38 PM  Result Value Ref Range   D-Dimer, Quant 5.18 (H) 0.00 - 0.50 ug/mL-FEU    Comment: (NOTE) At the manufacturer cut-off value of 0.5 g/mL FEU, this assay has a negative predictive value of 95-100%.This assay is intended for use in conjunction with a clinical pretest probability (PTP) assessment model to exclude pulmonary embolism (PE) and deep venous thrombosis (DVT) in outpatients suspected of PE or DVT. Results should be correlated with clinical presentation. Performed at [facility], [address], [place], [place] [address]   I-Stat CG4 Lactic Acid     Status: Abnormal   Collection Time: 10/30/22  3:56 PM  Result Value Ref Range   Lactic Acid, Venous 6.3 (HH) 0.5 - 1.9 mmol/L   Comment NOTIFIED PHYSICIAN   Urinalysis, Routine w reflex microscopic -Urine, Clean Catch     Status: None   Collection Time: 10/30/22  6:08 PM  Result Value Ref Range   Color, Urine YELLOW YELLOW   APPearance CLEAR CLEAR   Specific Gravity, Urine 1.026 1.005 - 1.030   pH 5.0 5.0 - 8.0   Glucose, UA NEGATIVE NEGATIVE mg/dL   Hgb urine dipstick NEGATIVE NEGATIVE   Bilirubin Urine NEGATIVE NEGATIVE   Ketones, ur NEGATIVE NEGATIVE mg/dL   Protein, ur NEGATIVE NEGATIVE mg/dL   Nitrite NEGATIVE NEGATIVE   Leukocytes,Ua NEGATIVE NEGATIVE    Comment: Performed at [facility], [address], [place], [place] [address]  Troponin I (High Sensitivity)     Status: Abnormal   Collection Time: 10/30/22  6:22 PM  Result Value Ref Range   Troponin I (High Sensitivity) 28 (H) <18 ng/L    Comment: (NOTE) Elevated high sensitivity troponin I (hsTnI) values and significant  changes across serial measurements may suggest ACS but many other  chronic and  acute conditions are known to elevate hsTnI results.  Refer to the "Links" section for chest pain algorithms and additional  guidance. Performed at [facility], [address], [place],  [place] [address]   I-Stat CG4 Lactic Acid     Status: Abnormal   Collection Time: 10/30/22  6:41 PM  Result Value Ref Range   Lactic Acid, Venous 4.6 (HH) 0.5 - 1.9 mmol/L   Comment NOTIFIED PHYSICIAN   POC CBG, ED     Status: Abnormal   Collection Time: 10/30/22  7:58 PM  Result Value Ref Range   Glucose-Capillary 264 (H) 70 - 99 mg/dL    Comment: Glucose reference range applies only to samples taken after fasting for at least 8 hours.  Troponin I (High Sensitivity)     Status: Abnormal   Collection Time: 10/30/22  8:03 PM  Result Value Ref Range   Troponin I (High Sensitivity) 32 (H) <18 ng/L    Comment: (NOTE) Elevated high sensitivity troponin I (hsTnI) values and significant  changes across serial measurements may suggest ACS but many other  chronic and acute conditions are known to elevate hsTnI results.  Refer to the "Links" section for chest pain algorithms and additional  guidance. Performed at [facility], [address] [place], [place] [address]   Resp panel by RT-PCR (RSV, Flu A&B, Covid) Anterior Nasal Swab     Status: None   Collection Time: 10/30/22  9:27 PM   Specimen: Anterior Nasal Swab  Result Value Ref Range   SARS Coronavirus 2 by RT PCR NEGATIVE NEGATIVE   Influenza A by PCR NEGATIVE NEGATIVE   Influenza B by PCR NEGATIVE NEGATIVE    Comment: (NOTE) The Xpert Xpress SARS-CoV-2/FLU/RSV plus assay is intended as an aid in the diagnosis of influenza from Nasopharyngeal swab specimens and should not be used as a sole basis for treatment. Nasal washings and aspirates are unacceptable for Xpert Xpress SARS-CoV-2/FLU/RSV testing.  Fact Sheet for Patients: BloggerCourse.com  Fact Sheet for Healthcare Providers: SeriousBroker.it  This test is not yet approved or cleared by the [place] FDA and has been authorized for detection and/or diagnosis of SARS-CoV-2 by FDA under an Emergency Use Authorization  (EUA). This EUA will remain in effect (meaning this test can be used) for the duration of the COVID-19 declaration under Section 564(b)(1) of the Act, 21 U.S.C. section 360bbb-3(b)(1), unless the authorization is terminated or revoked.     Resp Syncytial Virus by PCR NEGATIVE NEGATIVE    Comment: (NOTE) Fact Sheet for Patients: BloggerCourse.com  Fact Sheet for Healthcare Providers: SeriousBroker.it  This test is not yet approved or cleared by the [place] FDA and has been authorized for detection and/or diagnosis of SARS-CoV-2 by FDA under an Emergency Use Authorization (EUA). This EUA will remain in effect (meaning this test can be used) for the duration of the COVID-19 declaration under Section 564(b)(1) of the Act, 21 U.S.C. section 360bbb-3(b)(1), unless the authorization is terminated or revoked.  Performed at [facility], [address] [place], [place] [address]   CBG monitoring, ED     Status: Abnormal   Collection Time: 10/30/22 11:04 PM  Result Value Ref Range   Glucose-Capillary 183 (H) 70 - 99 mg/dL    Comment: Glucose reference range applies only to samples taken after fasting for at least 8 hours.   Comment 1 Notify RN    Comment 2 Document in Chart    CT Angio Chest  PE W and/or Wo Contrast  Result Date: 10/30/2022 CLINICAL DATA:  Pulmonary embolus suspected with low to intermediate probability. Positive D-dimer. Hypoxia. EXAM: CT ANGIOGRAPHY CHEST WITH CONTRAST TECHNIQUE: Multidetector CT imaging of the chest was performed using the standard protocol during bolus administration of intravenous contrast. Multiplanar CT image reconstructions and MIPs were obtained to evaluate the vascular anatomy. RADIATION DOSE REDUCTION: This exam was performed according to the departmental dose-optimization program which includes automated exposure control, adjustment of the mA and/or kV according to patient size and/or  use of iterative reconstruction technique. CONTRAST:  60mL OMNIPAQUE IOHEXOL 350 MG/ML SOLN COMPARISON:  Chest radiograph 10/30/2022 FINDINGS: Cardiovascular: Good opacification of the central and segmental pulmonary arteries with mild motion artifact no focal filling defects. No evidence of significant pulmonary embolus. Dilated central pulmonary arteries may indicate pulmonary arterial hypertension in the appropriate clinical setting. Normal heart size. No pericardial effusions. Calcification of the aorta, coronary arteries, and mitral valve annulus. Mediastinum/Nodes: Thyroid gland is unremarkable. Esophagus is decompressed. No significant lymphadenopathy. Lungs/Pleura: Motion artifact limits examination. There is atelectasis in the lung bases. No pleural effusions or pneumothorax. Upper Abdomen: No acute abnormalities. Prominent aortic calcification with calcific stenosis of the aorta at the level of the renal arteries representing a proximally 80% diameter reduction. Musculoskeletal: Degenerative changes in the spine. Old rib fractures. No acute bony abnormalities. Review of the MIP images confirms the above findings. IMPRESSION: 1. No evidence of significant pulmonary embolus. 2. Atelectasis in the lung bases. 3. Dilated main pulmonary artery may indicate pulmonary arterial hypertension. 4. Extensive aortic atherosclerosis with evidence of 80% calcific stenosis of the upper abdominal aorta. Electronically Signed   By: Burman Nieves M.D.   On: 10/30/2022 19:10   CT Head Wo Contrast  Result Date: 10/30/2022 CLINICAL DATA:  Found unresponsive EXAM: CT HEAD WITHOUT CONTRAST CT CERVICAL SPINE WITHOUT CONTRAST TECHNIQUE: Multidetector CT imaging of the head and cervical spine was performed following the standard protocol without intravenous contrast. Multiplanar CT image reconstructions of the cervical spine were also generated. RADIATION DOSE REDUCTION: This exam was performed according to the departmental  dose-optimization program which includes automated exposure control, adjustment of the mA and/or kV according to patient size and/or use of iterative reconstruction technique. COMPARISON:  None are available. FINDINGS: CT HEAD FINDINGS Brain: Motion obscures fine detail. No intracranial hemorrhage, mass effect, or evidence of acute infarct. No hydrocephalus. No extra-axial fluid collection. Generalized cerebral atrophy and chronic small vessel ischemic disease. Vascular: No hyperdense vessel. Intracranial arterial calcification. Skull: No fracture or focal lesion. Sinuses/Orbits: Mild mucosal thickening in the paranasal sinuses. Other: Impacted left maxillary lateral incisor with periapical lucency. CT CERVICAL SPINE FINDINGS Alignment: No evidence of traumatic malalignment. Skull base and vertebrae: No acute fracture. No primary bone lesion or focal pathologic process. Soft tissues and spinal canal: No prevertebral fluid or swelling. No visible canal hematoma. Disc levels: Multilevel age-related degenerative spondylosis. No severe spinal canal narrowing. Upper chest: See separate report. Other: Carotid calcification. IMPRESSION: 1. No acute intracranial abnormality. Generalized atrophy and small vessel white matter disease. 2. No acute fracture in the cervical spine. Multilevel degenerative spondylosis. Electronically Signed   By: Minerva Fester M.D.   On: 10/30/2022 18:49   CT Cervical Spine Wo Contrast  Result Date: 10/30/2022 CLINICAL DATA:  Found unresponsive EXAM: CT HEAD WITHOUT CONTRAST CT CERVICAL SPINE WITHOUT CONTRAST TECHNIQUE: Multidetector CT imaging of the head and cervical spine was performed following the standard protocol without intravenous contrast. Multiplanar CT image reconstructions of  the cervical spine were also generated. RADIATION DOSE REDUCTION: This exam was performed according to the departmental dose-optimization program which includes automated exposure control, adjustment of the  mA and/or kV according to patient size and/or use of iterative reconstruction technique. COMPARISON:  None are available. FINDINGS: CT HEAD FINDINGS Brain: Motion obscures fine detail. No intracranial hemorrhage, mass effect, or evidence of acute infarct. No hydrocephalus. No extra-axial fluid collection. Generalized cerebral atrophy and chronic small vessel ischemic disease. Vascular: No hyperdense vessel. Intracranial arterial calcification. Skull: No fracture or focal lesion. Sinuses/Orbits: Mild mucosal thickening in the paranasal sinuses. Other: Impacted left maxillary lateral incisor with periapical lucency. CT CERVICAL SPINE FINDINGS Alignment: No evidence of traumatic malalignment. Skull base and vertebrae: No acute fracture. No primary bone lesion or focal pathologic process. Soft tissues and spinal canal: No prevertebral fluid or swelling. No visible canal hematoma. Disc levels: Multilevel age-related degenerative spondylosis. No severe spinal canal narrowing. Upper chest: See separate report. Other: Carotid calcification. IMPRESSION: 1. No acute intracranial abnormality. Generalized atrophy and small vessel white matter disease. 2. No acute fracture in the cervical spine. Multilevel degenerative spondylosis. Electronically Signed   By: Minerva Fester M.D.   On: 10/30/2022 18:49   DG Chest Portable 1 View  Result Date: 10/30/2022 CLINICAL DATA:  Pneumonia and shortness of breath EXAM: PORTABLE CHEST 1 VIEW COMPARISON:  Radiographs 08/17/2015 FINDINGS: Low lung volumes accentuate cardiomediastinal silhouette and pulmonary vascularity. Fat pad at the cardiac apex. Left-greater-than-right basilar atelectasis/scarring. No definite pleural effusion. No pneumothorax. No displaced rib fractures. IMPRESSION: Low lung volumes with left-greater-than-right basilar atelectasis/scarring. Electronically Signed   By: Minerva Fester M.D.   On: 10/30/2022 17:46    Pending Labs Unresulted Labs (From admission,  onward)     Start     Ordered   10/31/22 0500  Comprehensive metabolic panel  Tomorrow morning,   R        10/30/22 2154   10/31/22 0500  CBC with Differential/Platelet  Tomorrow morning,   R        10/30/22 2154   10/30/22 2154  Magnesium  Add-on,   AD        10/30/22 2154   10/30/22 2145  Hemoglobin A1c  Once,   R       Comments: To assess prior glycemic control    10/30/22 2144   10/30/22 1541  Culture, blood (routine x 2)  BLOOD CULTURE X 2,   R      10/30/22 1541            Vitals/Pain Today's Vitals   10/30/22 2008 10/30/22 2053 10/30/22 2100 10/30/22 2318  BP: (!) 188/78  134/60 (!) 167/73  Pulse: 93  78 77  Resp:   (!) 21 16  Temp: 97.8 F (36.6 C)   98.2 F (36.8 C)  TempSrc: Oral   Oral  SpO2: 98%  98% 99%  Weight:      Height:      PainSc:  5   0-No pain    Isolation Precautions No active isolations  Medications Medications  insulin aspart (novoLOG) injection 0-15 Units (has no administration in time range)  insulin aspart (novoLOG) injection 0-5 Units ( Subcutaneous Not Given 10/30/22 2307)  heparin injection 5,000 Units (5,000 Units Subcutaneous Given 10/30/22 2308)  acetaminophen (TYLENOL) tablet 650 mg (has no administration in time range)    Or  acetaminophen (TYLENOL) suppository 650 mg (has no administration in time range)  senna-docusate (Senokot-S) tablet 1 tablet (has no  administration in time range)  ondansetron (ZOFRAN) tablet 4 mg (has no administration in time range)    Or  ondansetron (ZOFRAN) injection 4 mg (has no administration in time range)  0.9 %  sodium chloride infusion ( Intravenous New Bag/Given 10/30/22 2339)  atorvastatin (LIPITOR) tablet 40 mg (has no administration in time range)  clopidogrel (PLAVIX) tablet 75 mg (has no administration in time range)  diltiazem (CARDIZEM CD) 24 hr capsule 240 mg (has no administration in time range)  donepezil (ARICEPT) tablet 10 mg (10 mg Oral Given 10/30/22 2306)  insulin glargine-yfgn  (SEMGLEE) injection 18 Units (has no administration in time range)  memantine (NAMENDA) tablet 10 mg (10 mg Oral Given 10/30/22 2306)  pantoprazole (PROTONIX) EC tablet 40 mg (has no administration in time range)  tamsulosin (FLOMAX) capsule 0.4 mg (has no administration in time range)  saccharomyces boulardii (FLORASTOR) capsule 250 mg (250 mg Oral Given 10/30/22 2306)  iohexol (OMNIPAQUE) 350 MG/ML injection 60 mL (60 mLs Intravenous Contrast Given 10/30/22 1724)  aspirin chewable tablet 324 mg (324 mg Oral Given 10/30/22 1805)  acetaminophen (TYLENOL) tablet 650 mg (650 mg Oral Given 10/30/22 1806)  ondansetron (ZOFRAN) injection 4 mg (4 mg Intravenous Given 10/30/22 2039)  sodium chloride 0.9 % bolus 1,000 mL (0 mLs Intravenous Stopped 10/30/22 2330)    Mobility walks with person assist     Focused Assessments Pulmonary Assessment Handoff:  Lung sounds: Bilateral Breath Sounds: Diminished O2 Device: Nasal Cannula O2 Flow Rate (L/min): 2 L/min    R Recommendations: See Admitting Provider Note  Report given to:   Additional Notes: 3L [place], No O2 at home. Pt has dementia, very forgetful.

## 2022-10-30 NOTE — ED Provider Notes (Signed)
[place] EMERGENCY DEPARTMENT AT [facility] Provider Note   CSN: [id number] Arrival date & time: 10/30/22  1530     History  Chief Complaint  Patient presents with   Shortness of Breath    William Burke is a 82 y.o. male.  With past medical history of dementia, hypertension, peripheral artery disease, type 2 diabetes who presents to the ED after an episode of unresponsiveness.  Patient was sitting outside with his wife earlier this afternoon and went back into his home.  Wife found him unresponsive on the bedroom floor and initiated chest compressions.  Upon EMS arrival patient did have a pulse but seem to be in respiratory distress which improved after placing him on a nonrebreather 15 L.  I did note the patient to be confused.  Narcan was also administered given concern for pinpoint pupils but this did not have a significant change in his mental status.  Last known well time was 1400.  In the ED patient does not recall the events that led up to today's episode.  He reports feeling well prior to today.  He does note chest discomfort stating "it feels like someone stepped on my chest"   Shortness of Breath      Home Medications Prior to Admission medications   Medication Sig Start Date End Date Taking? Authorizing Provider  busPIRone (BUSPAR) 10 MG tablet Take 10 mg by mouth 3 (three) times daily.   Yes [provider]  cetirizine ([employer] ALLERGY RELIEF,CETIRIZINE,) 10 MG tablet Take 10 mg by mouth daily.   Yes [provider]  clopidogrel (PLAVIX) 75 MG tablet Take 1 tablet (75 mg total) by mouth daily. 05/25/13  Yes Clinton Gallant M, PA-C  diltiazem (CARDIZEM CD) 240 MG 24 hr capsule Take 240 mg by mouth daily. 10/27/22  Yes [provider]  donepezil (ARICEPT) 10 MG tablet TAKE 1 TABLET BY MOUTH EVERYDAY AT BEDTIME 10/27/22  Yes Glean Salvo, NP  ferrous sulfate 325 (65 FE) MG tablet Take 325 mg by mouth daily. 07/17/21  Yes [provider]  furosemide (LASIX) 20 MG tablet Take 10 mg by mouth daily. 05/09/21  Yes [provider]  glipiZIDE (GLUCOTROL) 10 MG tablet Take 2 tablets by mouth in the morning and at bedtime. 06/22/12  Yes [provider]  LANTUS SOLOSTAR 100 UNIT/ML Solostar Pen Inject 18 Units into the skin in the morning. 09/02/22  Yes [provider]  lisinopril-hydrochlorothiazide (PRINZIDE,ZESTORETIC) 20-12.5 MG tablet Take 1 tablet by mouth daily. 12/12/14  Yes [provider]  memantine (NAMENDA) 10 MG tablet TAKE 1 TABLET BY MOUTH TWICE A DAY 08/04/22  Yes Glean Salvo, NP  metFORMIN (GLUCOPHAGE) 1000 MG tablet Take 1,000 mg by mouth 2 (two) times daily with a meal.   Yes [provider]  omeprazole (PRILOSEC) 20 MG capsule Take 20 mg by mouth daily. 10/02/14  Yes [provider]  Saw Palmetto 450-15 MG CAPS Take 1 capsule by mouth daily. 02/03/18  Yes [provider]  UNABLE TO FIND Med Name: calcium, magnesium, saw palmetto   Yes [provider]  atorvastatin (LIPITOR) 40 MG tablet Take 40 mg by mouth daily.    [provider]  KLOR-CON M10 10 MEQ tablet Take 10 mEq by mouth daily. 04/09/21   [provider]  Liraglutide (VICTOZA Aucilla) Inject into the skin.    [provider]  niacin 500 MG tablet Take by mouth.    [provider]  pioglitazone (ACTOS) 30 MG tablet Take 30 mg by mouth daily.  03/30/17   [provider]  tamsulosin (FLOMAX) 0.4 MG CAPS capsule Take 1 tablet by mouth daily. 11/15/19   [provider]      Allergies    Norco [hydrocodone-acetaminophen]    Review of Systems   Review of Systems  Respiratory:  Positive for shortness of breath.     Physical Exam Updated Vital Signs BP 134/60   Pulse 78   Temp 97.8 F (36.6 C) (Oral)   Resp (!) 21   Ht 5\' 2"  (1.575 m)   Wt 83.9 kg   SpO2 98%   BMI 33.83 kg/m  Physical Exam Vitals and nursing note reviewed.  HENT:      Head: Normocephalic and atraumatic.  Eyes:     Pupils: Pupils are equal, round, and reactive to light.  Cardiovascular:     Rate and Rhythm: Normal rate and regular rhythm.  Pulmonary:     Effort: Pulmonary effort is normal.     Breath sounds: Normal breath sounds.     Comments: Anterior chest wall tenderness without crepitus or deformity Chest:     Chest wall: No deformity or crepitus.  Abdominal:     Palpations: Abdomen is soft.     Tenderness: There is no abdominal tenderness.  Skin:    General: Skin is warm and dry.  Neurological:     General: No focal deficit present.     Mental Status: He is alert.     Cranial Nerves: No cranial nerve deficit.     Motor: No weakness.     Comments: Oriented to self only  Psychiatric:        Mood and Affect: Mood normal.     ED Results / Procedures / Treatments   Labs (all labs ordered are listed, but only abnormal results are displayed) Labs Reviewed  COMPREHENSIVE METABOLIC PANEL - Abnormal; Notable for the following components:      Result Value   CO2 21 (*)    Glucose, Bld 221 (*)    Creatinine, Ser 1.70 (*)    Calcium 8.7 (*)    Albumin 3.3 (*)    GFR, Estimated 40 (*)    All other components within normal limits  CBC WITH DIFFERENTIAL/PLATELET - Abnormal; Notable for the following components:   RBC 3.54 (*)    Hemoglobin 10.9 (*)    HCT 34.2 (*)    Abs Immature Granulocytes 0.17 (*)    All other components within normal limits  D-DIMER, QUANTITATIVE - Abnormal; Notable for the following components:   D-Dimer, Quant 5.18 (*)    All other components within normal limits  CBG MONITORING, ED - Abnormal; Notable for the following components:   Glucose-Capillary 264 (*)    All other components within normal limits  I-STAT CG4 LACTIC ACID, ED - Abnormal; Notable for the following components:   Lactic Acid, Venous 6.3 (*)    All other components within normal limits  I-STAT CG4 LACTIC ACID, ED - Abnormal; Notable for the  following components:   Lactic Acid, Venous 4.6 (*)    All other components within normal limits  TROPONIN I (HIGH SENSITIVITY) - Abnormal; Notable for the following components:   Troponin I (High Sensitivity) 28 (*)    All other components within normal limits  TROPONIN I (HIGH SENSITIVITY) - Abnormal; Notable for the following components:   Troponin I (High Sensitivity) 32 (*)    All other  components within normal limits  CULTURE, BLOOD (ROUTINE X 2)  CULTURE, BLOOD (ROUTINE X 2)  RESP PANEL BY RT-PCR (RSV, FLU A&B, COVID)  RVPGX2  URINALYSIS, ROUTINE W REFLEX MICROSCOPIC  TROPONIN I (HIGH SENSITIVITY)    EKG EKG Interpretation Date/Time:  Thursday October 30 2022 19:59:44 EDT Ventricular Rate:  85 PR Interval:  150 QRS Duration:  148 QT Interval:  392 QTC Calculation: 467 R Axis:   -72  Text Interpretation: Sinus rhythm Right bundle branch block Inferior infarct, old Lateral leads are also involved Confirmed by Estelle June [phone]) on 10/30/2022 9:28:30 PM  Radiology CT Angio Chest PE W and/or Wo Contrast  Result Date: 10/30/2022 CLINICAL DATA:  Pulmonary embolus suspected with low to intermediate probability. Positive D-dimer. Hypoxia. EXAM: CT ANGIOGRAPHY CHEST WITH CONTRAST TECHNIQUE: Multidetector CT imaging of the chest was performed using the standard protocol during bolus administration of intravenous contrast. Multiplanar CT image reconstructions and MIPs were obtained to evaluate the vascular anatomy. RADIATION DOSE REDUCTION: This exam was performed according to the departmental dose-optimization program which includes automated exposure control, adjustment of the mA and/or kV according to patient size and/or use of iterative reconstruction technique. CONTRAST:  60mL OMNIPAQUE IOHEXOL 350 MG/ML SOLN COMPARISON:  Chest radiograph 10/30/2022 FINDINGS: Cardiovascular: Good opacification of the central and segmental pulmonary arteries with mild motion artifact no focal  filling defects. No evidence of significant pulmonary embolus. Dilated central pulmonary arteries may indicate pulmonary arterial hypertension in the appropriate clinical setting. Normal heart size. No pericardial effusions. Calcification of the aorta, coronary arteries, and mitral valve annulus. Mediastinum/Nodes: Thyroid gland is unremarkable. Esophagus is decompressed. No significant lymphadenopathy. Lungs/Pleura: Motion artifact limits examination. There is atelectasis in the lung bases. No pleural effusions or pneumothorax. Upper Abdomen: No acute abnormalities. Prominent aortic calcification with calcific stenosis of the aorta at the level of the renal arteries representing a proximally 80% diameter reduction. Musculoskeletal: Degenerative changes in the spine. Old rib fractures. No acute bony abnormalities. Review of the MIP images confirms the above findings. IMPRESSION: 1. No evidence of significant pulmonary embolus. 2. Atelectasis in the lung bases. 3. Dilated main pulmonary artery may indicate pulmonary arterial hypertension. 4. Extensive aortic atherosclerosis with evidence of 80% calcific stenosis of the upper abdominal aorta. Electronically Signed   By: Burman Nieves M.D.   On: 10/30/2022 19:10   CT Head Wo Contrast  Result Date: 10/30/2022 CLINICAL DATA:  Found unresponsive EXAM: CT HEAD WITHOUT CONTRAST CT CERVICAL SPINE WITHOUT CONTRAST TECHNIQUE: Multidetector CT imaging of the head and cervical spine was performed following the standard protocol without intravenous contrast. Multiplanar CT image reconstructions of the cervical spine were also generated. RADIATION DOSE REDUCTION: This exam was performed according to the departmental dose-optimization program which includes automated exposure control, adjustment of the mA and/or kV according to patient size and/or use of iterative reconstruction technique. COMPARISON:  None are available. FINDINGS: CT HEAD FINDINGS Brain: Motion obscures  fine detail. No intracranial hemorrhage, mass effect, or evidence of acute infarct. No hydrocephalus. No extra-axial fluid collection. Generalized cerebral atrophy and chronic small vessel ischemic disease. Vascular: No hyperdense vessel. Intracranial arterial calcification. Skull: No fracture or focal lesion. Sinuses/Orbits: Mild mucosal thickening in the paranasal sinuses. Other: Impacted left maxillary lateral incisor with periapical lucency. CT CERVICAL SPINE FINDINGS Alignment: No evidence of traumatic malalignment. Skull base and vertebrae: No acute fracture. No primary bone lesion or focal pathologic process. Soft tissues and spinal canal: No prevertebral fluid or swelling. No visible canal hematoma. Disc  levels: Multilevel age-related degenerative spondylosis. No severe spinal canal narrowing. Upper chest: See separate report. Other: Carotid calcification. IMPRESSION: 1. No acute intracranial abnormality. Generalized atrophy and small vessel white matter disease. 2. No acute fracture in the cervical spine. Multilevel degenerative spondylosis. Electronically Signed   By: Minerva Fester M.D.   On: 10/30/2022 18:49   CT Cervical Spine Wo Contrast  Result Date: 10/30/2022 CLINICAL DATA:  Found unresponsive EXAM: CT HEAD WITHOUT CONTRAST CT CERVICAL SPINE WITHOUT CONTRAST TECHNIQUE: Multidetector CT imaging of the head and cervical spine was performed following the standard protocol without intravenous contrast. Multiplanar CT image reconstructions of the cervical spine were also generated. RADIATION DOSE REDUCTION: This exam was performed according to the departmental dose-optimization program which includes automated exposure control, adjustment of the mA and/or kV according to patient size and/or use of iterative reconstruction technique. COMPARISON:  None are available. FINDINGS: CT HEAD FINDINGS Brain: Motion obscures fine detail. No intracranial hemorrhage, mass effect, or evidence of acute infarct. No  hydrocephalus. No extra-axial fluid collection. Generalized cerebral atrophy and chronic small vessel ischemic disease. Vascular: No hyperdense vessel. Intracranial arterial calcification. Skull: No fracture or focal lesion. Sinuses/Orbits: Mild mucosal thickening in the paranasal sinuses. Other: Impacted left maxillary lateral incisor with periapical lucency. CT CERVICAL SPINE FINDINGS Alignment: No evidence of traumatic malalignment. Skull base and vertebrae: No acute fracture. No primary bone lesion or focal pathologic process. Soft tissues and spinal canal: No prevertebral fluid or swelling. No visible canal hematoma. Disc levels: Multilevel age-related degenerative spondylosis. No severe spinal canal narrowing. Upper chest: See separate report. Other: Carotid calcification. IMPRESSION: 1. No acute intracranial abnormality. Generalized atrophy and small vessel white matter disease. 2. No acute fracture in the cervical spine. Multilevel degenerative spondylosis. Electronically Signed   By: Minerva Fester M.D.   On: 10/30/2022 18:49   DG Chest Portable 1 View  Result Date: 10/30/2022 CLINICAL DATA:  Pneumonia and shortness of breath EXAM: PORTABLE CHEST 1 VIEW COMPARISON:  Radiographs 08/17/2015 FINDINGS: Low lung volumes accentuate cardiomediastinal silhouette and pulmonary vascularity. Fat pad at the cardiac apex. Left-greater-than-right basilar atelectasis/scarring. No definite pleural effusion. No pneumothorax. No displaced rib fractures. IMPRESSION: Low lung volumes with left-greater-than-right basilar atelectasis/scarring. Electronically Signed   By: Minerva Fester M.D.   On: 10/30/2022 17:46    Procedures Procedures    Medications Ordered in ED Medications  sodium chloride 0.9 % bolus 1,000 mL (has no administration in time range)  iohexol (OMNIPAQUE) 350 MG/ML injection 60 mL (60 mLs Intravenous Contrast Given 10/30/22 1724)  aspirin chewable tablet 324 mg (324 mg Oral Given 10/30/22 1805)   acetaminophen (TYLENOL) tablet 650 mg (650 mg Oral Given 10/30/22 1806)  ondansetron (ZOFRAN) injection 4 mg (4 mg Intravenous Given 10/30/22 2039)    ED Course/ Medical Decision Making/ A&P Clinical Course as of 10/30/22 2134  Thu Oct 30, 2022  1935 Troponin I (High Sensitivity)(!): 28 [MP]  2129 Laboratory workup notable for elevated D-dimer of 5.18.  CTA of the chest shows no evidence of pulmonary embolism or acute rib fractures.  No significant leukocytosis or focal consolidation on chest x-ray.  Second troponin was slightly uptrending but third troponin is flat.  Low suspicion for ACS.  No evidence of UTI.  Elevation in creatinine concerning for AKI.  Will provide 1 L IV fluids for hydration.  Awaiting COVID/RSV/influenza swab.  CT head and CT C-spine showed no evidence of acute traumatic findings.  Unclear etiology of the patient's unresponsive episode at home tonight.  He remained stable on O2 nasal cannula.  Discussed with admitting hospitalist accepts patient for admission [MP]    Clinical Course User Index [MP] Royanne Foots, DO                                 Medical Decision Making 82 year old male with history as above brought in by EMS after an episode of unresponsiveness.  Was found down by his wife this afternoon.  Last known well time at 1400.  Wife administered chest compressions prior to EMS arrival.  EMS noted the patient to be in respiratory stress with a pulse.  Started him on nonrebreather with good improvement in his mental status although he still appears confused.  He is afebrile and normotensive here.  No focal neurologic deficits on exam but he is only oriented to self and has no recollection of today's events.  Broad differential diagnosis considering unclear etiology of collapse ACS.  Will obtain high sensitive troponin and EKG Pulmonary embolism.  Will obtain D-dimer and CT of the chest if elevated Infectious cause with most likely respiratory source such as  pneumonia.  Will obtain chest x-ray and CBC Head trauma.  Will obtain CT head and neck No neurologic deficits on exam.  Less likely CVA/TIA  Amount and/or Complexity of Data Reviewed Labs: ordered. Decision-making details documented in ED Course. Radiology: ordered.  Risk OTC drugs. Prescription drug management. Decision regarding hospitalization.           Final Clinical Impression(s) / ED Diagnoses Final diagnoses:  Hypoxia  Unresponsive episode  Acute kidney injury [facility])    Rx / DC Orders ED Discharge Orders     None         Royanne Foots, DO 10/30/22 2134

## 2022-10-30 NOTE — ED Notes (Signed)
Pt to CT

## 2022-10-30 NOTE — ED Triage Notes (Addendum)
Pt arrives via EMS from private residence. EMS states pt found by wife, unresponsive- wife initated CPR. Fire arrived to residence, pt had a pulse, but was not breathing independently. Fire bagged pt, EMS states pt then sating 85% on RA  breathing independently. Pt alert to self. Pt with hx dementia, respirations even and non-labored.  Pupils pinpoint per EMS, administered 0.5 mg Narcan without significant change in presentation. Pt 94% on 3L [place].  EMS states pt combative upon arrival.  Last normal at 2pm.

## 2022-10-30 NOTE — H&P (Addendum)
PCP:   William Marlin, DO   Chief Complaint:  Decreased level of consciousness  HPI: This is a 82 year old male with past medical history of HLD, Alzheimer's dementia, HTN, PAD, T2DM, GERD, BPH.  Patient lives at home with his wife.  Today he was in the room getting dressed.  His wife heard him making grunting noise.  She went in he was up against the bed, sliding over.  Per wife she grabbed him and kept calling him.  He was unable to response and slid to the floor.  911 was called, as she was unsure if he was breathing, she was directed to CPR until ambulance arrived.  When EMS arrived, patient had a pulse but appeared to be in respiratory distress.  He was placed on the 15 L nonrebreather.  Narcan given with no improvement in mental status.  He was sent to the ER.  The patient had COVID and flu vaccines on Tuesday.  He had RSV vaccine done a few weeks back.  There is no reports of illnesses prior to episode.,  No shortness of breath, chest pain, fevers or chills.  In the ER patient placed on 2 L oxygen, satting over 99%.  Patient's initial lactic acid 6.3, decreased to 4.6 after 2 L IV fluid bolus.  Troponin 17 => 28 => 32.  Creatinine 1.7, baseline creatinine normal.  Glucose 221.  Respiratory panel negative, UA, CT head, CTA chest unrevealing without acute findings.  CT C-spine normal. Currently normal patient currently awake and interactive.  EKG NSR with RBBB  Wife and daughter at bedside  Review of Systems:  Per HPI  Past Medical History: Past Medical History:  Diagnosis Date   Anemia    Arthritis    osteoarthritis knees, elbow right   Carotid artery disease (HCC)    status post right carotid artery stenting 05/24/13   Complication of anesthesia    sensitive to anesthesia ,longer to awaken-"doesn't require much"   Complication of anesthesia    "can't sleep after anesthesia wears off and i become very anxious"    Diabetes mellitus, type 2 (HCC)    GERD (gastroesophageal reflux  disease)    occ   History of bronchitis    History of pneumonia    History of tobacco abuse    Hyperlipidemia    Hypertension    Memory difficulty 01/16/2020   Nocturia    PAD (peripheral artery disease) (HCC) 06/22/2007   Bilateral iliac artery PTA and stenting with iCAST. Last lower extremity arterial Dopplers 02/25/2011 right ABI 1.0 left ABI 1.1.  Bilateral carotid artery stenosis. Last carotid Dopplers 07/18/2011: Max ICA stenosis 50-69% bilaterally   UTI (lower urinary tract infection)    Wears glasses    Past Surgical History:  Procedure Laterality Date   APPENDECTOMY     ruptured as child taken out as adult   Bowl surgery  1989   to free stricture-part of colon removed from appendix issue   CARDIAC CATHETERIZATION     aortogram iliac artery stent   CAROTID ENDARTERECTOMY     CAROTID STENT Right 05/24/2013   CAROTID STENT INSERTION Right 05/24/2013   Procedure: CAROTID STENT INSERTION;  Surgeon: Nada Libman, MD;  Location: [facility] CATH LAB;  Service: Cardiovascular;  Laterality: Right;   COLONOSCOPY     ENDARTERECTOMY Right 09/02/2012   Procedure: ENDARTERECTOMY CAROTID with Resection of Common Carotid Artery;  Surgeon: Nada Libman, MD;  Location: [facility] OR;  Service: Vascular;  Laterality: Right;  ENDARTERECTOMY FEMORAL Right 08/30/2015   Procedure: RIGHT FEMORAL ENDARTERECTOMY;  Surgeon: Nada Libman, MD;  Location: [facility] OR;  Service: Vascular;  Laterality: Right;   LEG ANGIOGRAPHY  07/23/2015   1. abdominal aortogram, bilateral iliac angiogram, bifemoral runoff   LEG ANGIOGRAPHY  07/23/2015   2. PTA and covered stent right common iliac artery   NASAL POLYP SURGERY     PARTIAL KNEE ARTHROPLASTY Left 01/08/2015   Procedure: LEFT  UNI KNEE ARTHOLPLASTY MEDIALLY;  Surgeon: Durene Romans, MD;  Location: [facility] ORS;  Service: Orthopedics;  Laterality: Left;   PATCH ANGIOPLASTY Right 09/02/2012   Procedure: PATCH ANGIOPLASTY using Vascu-Guard Vascular Patch;  Surgeon: Nada Libman,  MD;  Location: [facility] OR;  Service: Vascular;  Laterality: Right;leg   PATCH ANGIOPLASTY Right 08/30/2015   Procedure: PATCH ANGIOPLASTY;  Surgeon: Nada Libman, MD;  Location: [facility] OR;  Service: Vascular;  Laterality: Right;   PERIPHERAL VASCULAR CATHETERIZATION N/A 07/23/2015   Procedure: Lower Extremity Angiography;  Surgeon: Runell Gess, MD;  Location: [facility] INVASIVE CV LAB;  Service: Cardiovascular;  Laterality: N/A;   PERIPHERAL VASCULAR CATHETERIZATION Right 07/23/2015   Procedure: Peripheral Vascular Intervention;  Surgeon: Runell Gess, MD;  Location: [facility] INVASIVE CV LAB;  Service: Cardiovascular;  Laterality: Right;  Rt Common Iliac   TONSILLECTOMY     WRIST SURGERY Right    pinning    Medications: Prior to Admission medications   Medication Sig Start Date End Date Taking? Authorizing Provider  busPIRone (BUSPAR) 10 MG tablet Take 10 mg by mouth 2 (two) times daily.   Yes [provider]  clopidogrel (PLAVIX) 75 MG tablet Take 1 tablet (75 mg total) by mouth daily. 05/25/13  Yes Clinton Gallant M, PA-C  diltiazem (CARDIZEM CD) 240 MG 24 hr capsule Take 240 mg by mouth daily. 10/27/22  Yes [provider]  glipiZIDE (GLUCOTROL) 10 MG tablet Take 2 tablets by mouth in the morning and at bedtime. 06/22/12  Yes [provider]  memantine (NAMENDA) 10 MG tablet TAKE 1 TABLET BY MOUTH TWICE A DAY 08/04/22  Yes Glean Salvo, NP  metFORMIN (GLUCOPHAGE) 1000 MG tablet Take 1,000 mg by mouth 2 (two) times daily with a meal.   Yes [provider]  atorvastatin (LIPITOR) 40 MG tablet Take 40 mg by mouth daily.    [provider]  diltiazem (TIAZAC) 240 MG 24 hr capsule Take 240 mg by mouth daily.     [provider]  donepezil (ARICEPT) 10 MG tablet TAKE 1 TABLET BY MOUTH EVERYDAY AT BEDTIME 10/27/22   Glean Salvo, NP  furosemide (LASIX) 20 MG tablet Take 10 mg by mouth daily. 05/09/21   [provider]  KLOR-CON M10 10 MEQ tablet Take  10 mEq by mouth daily. 04/09/21   [provider]  Liraglutide (VICTOZA [place]) Inject into the skin.    [provider]  lisinopril-hydrochlorothiazide (PRINZIDE,ZESTORETIC) 20-12.5 MG tablet Take 1 tablet by mouth daily. 12/12/14   [provider]  niacin 500 MG tablet Take by mouth.    [provider]  omeprazole (PRILOSEC) 20 MG capsule Take 20 mg by mouth daily. 10/02/14   [provider]  pioglitazone (ACTOS) 30 MG tablet Take 30 mg by mouth daily.  03/30/17   [provider]  tamsulosin (FLOMAX) 0.4 MG CAPS capsule Take 1 tablet by mouth daily. 11/15/19   [provider]  UNABLE TO FIND Med Name: calcium, magnesium, saw palmetto    [provider]    Allergies:   Allergies  Allergen Reactions   Norco [Hydrocodone-Acetaminophen] Other (See Comments)    Pt wife states intolerance to stronger norco meds    Social History:  reports that he quit smoking about 35 years ago. His smoking use included cigarettes. He started smoking about 60 years ago. He has a 75 pack-year smoking history. He has never used smokeless tobacco. He reports that he does not drink alcohol and does not use drugs.  Family History: Family History  Problem Relation Age of Onset   Diabetes Mother    Hypertension Mother    Cerebral aneurysm Mother    Multiple sclerosis Father    Kidney disease Father     Physical Exam: Vitals:   10/30/22 1945 10/30/22 2007 10/30/22 2008 10/30/22 2100  BP:   (!) 188/78 134/60  Pulse: 83 92 93 78  Resp: 16 18  (!) 21  Temp:   97.8 F (36.6 C)   TempSrc:   Oral   SpO2: 99% 98% 98% 98%  Weight:      Height:        General: A&O x 3, well developed and nourished, no acute distress Eyes: Pnk conjunctiva, no scleral icterus ENT: Moist oral mucosa, neck supple, no thyromegaly Lungs: CTA B/L, no wheeze, no crackles, no use of accessory muscles Cardiovascular: RRR, no regurgitation, no gallops, no murmurs. No  JVD Abdomen: soft, positive BS, non-tender, non-distended, not an acute abdomen GU: not examined Neuro: CN II - XII grossly intact, sensation intact Musculoskeletal: strength 5/5 all extremities, no clubbing, cyanosis or edema Skin: no rash, no subcutaneous crepitation, no decubitus Psych: appropriate patient   Labs on Admission:  Recent Labs    10/30/22 1538  NA 139  K 3.7  CL 103  CO2 21*  GLUCOSE 221*  BUN 20  CREATININE 1.70*  CALCIUM 8.7*   Recent Labs    10/30/22 1538  AST 24  ALT 20  ALKPHOS 54  BILITOT 0.3  PROT 6.5  ALBUMIN 3.3*    Recent Labs    10/30/22 1538  WBC 7.9  NEUTROABS 5.6  HGB 10.9*  HCT 34.2*  MCV 96.6  PLT 175    Recent Labs    10/30/22 1538  DDIMER 5.18*    Radiological Exams on Admission: CT Angio Chest PE W and/or Wo Contrast  Result Date: 10/30/2022 CLINICAL DATA:  Pulmonary embolus suspected with low to intermediate probability. Positive D-dimer. Hypoxia. EXAM: CT ANGIOGRAPHY CHEST WITH CONTRAST TECHNIQUE: Multidetector CT imaging of the chest was performed using the standard protocol during bolus administration of intravenous contrast. Multiplanar CT image reconstructions and MIPs were obtained to evaluate the vascular anatomy. RADIATION DOSE REDUCTION: This exam was performed according to the departmental dose-optimization program which includes automated exposure control, adjustment of the mA and/or kV according to patient size and/or use of iterative reconstruction technique. CONTRAST:  60mL OMNIPAQUE IOHEXOL 350 MG/ML SOLN COMPARISON:  Chest radiograph 10/30/2022 FINDINGS: Cardiovascular: Good opacification of the central and segmental pulmonary arteries with mild motion artifact no focal filling defects. No evidence of significant pulmonary embolus. Dilated central pulmonary arteries may indicate pulmonary arterial hypertension in the appropriate clinical setting. Normal heart size. No pericardial effusions. Calcification of the  aorta, coronary arteries, and mitral valve annulus. Mediastinum/Nodes: Thyroid gland is unremarkable. Esophagus is decompressed. No significant lymphadenopathy. Lungs/Pleura: Motion artifact limits examination. There is atelectasis in the lung bases. No pleural effusions or pneumothorax. Upper Abdomen: No acute abnormalities. Prominent aortic calcification  with calcific stenosis of the aorta at the level of the renal arteries representing a proximally 80% diameter reduction. Musculoskeletal: Degenerative changes in the spine. Old rib fractures. No acute bony abnormalities. Review of the MIP images confirms the above findings. IMPRESSION: 1. No evidence of significant pulmonary embolus. 2. Atelectasis in the lung bases. 3. Dilated main pulmonary artery may indicate pulmonary arterial hypertension. 4. Extensive aortic atherosclerosis with evidence of 80% calcific stenosis of the upper abdominal aorta. Electronically Signed   By: Burman Nieves M.D.   On: 10/30/2022 19:10   CT Head Wo Contrast  Result Date: 10/30/2022 CLINICAL DATA:  Found unresponsive EXAM: CT HEAD WITHOUT CONTRAST CT CERVICAL SPINE WITHOUT CONTRAST TECHNIQUE: Multidetector CT imaging of the head and cervical spine was performed following the standard protocol without intravenous contrast. Multiplanar CT image reconstructions of the cervical spine were also generated. RADIATION DOSE REDUCTION: This exam was performed according to the departmental dose-optimization program which includes automated exposure control, adjustment of the mA and/or kV according to patient size and/or use of iterative reconstruction technique. COMPARISON:  None are available. FINDINGS: CT HEAD FINDINGS Brain: Motion obscures fine detail. No intracranial hemorrhage, mass effect, or evidence of acute infarct. No hydrocephalus. No extra-axial fluid collection. Generalized cerebral atrophy and chronic small vessel ischemic disease. Vascular: No hyperdense vessel.  Intracranial arterial calcification. Skull: No fracture or focal lesion. Sinuses/Orbits: Mild mucosal thickening in the paranasal sinuses. Other: Impacted left maxillary lateral incisor with periapical lucency. CT CERVICAL SPINE FINDINGS Alignment: No evidence of traumatic malalignment. Skull base and vertebrae: No acute fracture. No primary bone lesion or focal pathologic process. Soft tissues and spinal canal: No prevertebral fluid or swelling. No visible canal hematoma. Disc levels: Multilevel age-related degenerative spondylosis. No severe spinal canal narrowing. Upper chest: See separate report. Other: Carotid calcification. IMPRESSION: 1. No acute intracranial abnormality. Generalized atrophy and small vessel white matter disease. 2. No acute fracture in the cervical spine. Multilevel degenerative spondylosis. Electronically Signed   By: Minerva Fester M.D.   On: 10/30/2022 18:49   CT Cervical Spine Wo Contrast  Result Date: 10/30/2022 CLINICAL DATA:  Found unresponsive EXAM: CT HEAD WITHOUT CONTRAST CT CERVICAL SPINE WITHOUT CONTRAST TECHNIQUE: Multidetector CT imaging of the head and cervical spine was performed following the standard protocol without intravenous contrast. Multiplanar CT image reconstructions of the cervical spine were also generated. RADIATION DOSE REDUCTION: This exam was performed according to the departmental dose-optimization program which includes automated exposure control, adjustment of the mA and/or kV according to patient size and/or use of iterative reconstruction technique. COMPARISON:  None are available. FINDINGS: CT HEAD FINDINGS Brain: Motion obscures fine detail. No intracranial hemorrhage, mass effect, or evidence of acute infarct. No hydrocephalus. No extra-axial fluid collection. Generalized cerebral atrophy and chronic small vessel ischemic disease. Vascular: No hyperdense vessel. Intracranial arterial calcification. Skull: No fracture or focal lesion.  Sinuses/Orbits: Mild mucosal thickening in the paranasal sinuses. Other: Impacted left maxillary lateral incisor with periapical lucency. CT CERVICAL SPINE FINDINGS Alignment: No evidence of traumatic malalignment. Skull base and vertebrae: No acute fracture. No primary bone lesion or focal pathologic process. Soft tissues and spinal canal: No prevertebral fluid or swelling. No visible canal hematoma. Disc levels: Multilevel age-related degenerative spondylosis. No severe spinal canal narrowing. Upper chest: See separate report. Other: Carotid calcification. IMPRESSION: 1. No acute intracranial abnormality. Generalized atrophy and small vessel white matter disease. 2. No acute fracture in the cervical spine. Multilevel degenerative spondylosis. Electronically Signed   By: Joselyn Glassman  Stutzman M.D.   On: 10/30/2022 18:49   DG Chest Portable 1 View  Result Date: 10/30/2022 CLINICAL DATA:  Pneumonia and shortness of breath EXAM: PORTABLE CHEST 1 VIEW COMPARISON:  Radiographs 08/17/2015 FINDINGS: Low lung volumes accentuate cardiomediastinal silhouette and pulmonary vascularity. Fat pad at the cardiac apex. Left-greater-than-right basilar atelectasis/scarring. No definite pleural effusion. No pneumothorax. No displaced rib fractures. IMPRESSION: Low lung volumes with left-greater-than-right basilar atelectasis/scarring. Electronically Signed   By: Minerva Fester M.D.   On: 10/30/2022 17:46    Assessment/Plan Present on Admission:  Acute resp failure w/ hypoxia   Transient decreased level of consciousness, possible syncope -unclear etio. W/u so far unrevealing -Respiratory panel negative.  Imaging without acute pathology accounting for her symptoms. -Cardiology consult for [place] -Carotid US, Echo ordered -Monitor on tele -? Vaccine reaction  Acute kidney injury -IVF NS saline ordered -Follow lactic acid curve until normalized -Avoid nephrotoxic med. Lasix, ACEI, hydrochlorothiazide, potassium tablet on  hold -I/O's -Suspect dehydration but BUN is normal   Marked Lactic acidosis -Cultures collected. IVF hydration for now -No antibiotics initiated as no sign of infection   Elevated troponin -Likely secondary to CPR -Troponin are mildly elevation but flat   Dementia (HCC) -Patient's Aricept and Namenda resumed   Dyslipidemia -Atorvastatin resumed   Essential hypertension -Diltiazem resumed.  HCTZ, Lasix and lisinopril discontinued   PAD (peripheral artery disease) (HCC)//  -80% calcific stenosis noted in abdominal aorta -Patient maintained on statin -Outpatient follow-up   T2DM -sliding scale insulin initiated   BPH -Flomax resumed  William Burke 10/30/2022, 9:27 PM

## 2022-10-31 ENCOUNTER — Observation Stay (HOSPITAL_BASED_OUTPATIENT_CLINIC_OR_DEPARTMENT_OTHER): Payer: Medicare HMO

## 2022-10-31 ENCOUNTER — Observation Stay (HOSPITAL_COMMUNITY): Payer: Medicare HMO

## 2022-10-31 DIAGNOSIS — F0280 Dementia in other diseases classified elsewhere without behavioral disturbance: Secondary | ICD-10-CM | POA: Diagnosis present

## 2022-10-31 DIAGNOSIS — I2510 Atherosclerotic heart disease of native coronary artery without angina pectoris: Secondary | ICD-10-CM | POA: Diagnosis present

## 2022-10-31 DIAGNOSIS — R4182 Altered mental status, unspecified: Secondary | ICD-10-CM | POA: Diagnosis present

## 2022-10-31 DIAGNOSIS — Z794 Long term (current) use of insulin: Secondary | ICD-10-CM | POA: Diagnosis not present

## 2022-10-31 DIAGNOSIS — K219 Gastro-esophageal reflux disease without esophagitis: Secondary | ICD-10-CM | POA: Diagnosis present

## 2022-10-31 DIAGNOSIS — R55 Syncope and collapse: Secondary | ICD-10-CM | POA: Diagnosis not present

## 2022-10-31 DIAGNOSIS — R404 Transient alteration of awareness: Secondary | ICD-10-CM | POA: Diagnosis not present

## 2022-10-31 DIAGNOSIS — R7989 Other specified abnormal findings of blood chemistry: Secondary | ICD-10-CM | POA: Diagnosis present

## 2022-10-31 DIAGNOSIS — I700 Atherosclerosis of aorta: Secondary | ICD-10-CM | POA: Diagnosis present

## 2022-10-31 DIAGNOSIS — I6523 Occlusion and stenosis of bilateral carotid arteries: Secondary | ICD-10-CM | POA: Diagnosis present

## 2022-10-31 DIAGNOSIS — N179 Acute kidney failure, unspecified: Secondary | ICD-10-CM | POA: Diagnosis present

## 2022-10-31 DIAGNOSIS — R569 Unspecified convulsions: Secondary | ICD-10-CM | POA: Diagnosis present

## 2022-10-31 DIAGNOSIS — Z95820 Peripheral vascular angioplasty status with implants and grafts: Secondary | ICD-10-CM | POA: Diagnosis not present

## 2022-10-31 DIAGNOSIS — J9601 Acute respiratory failure with hypoxia: Secondary | ICD-10-CM | POA: Diagnosis present

## 2022-10-31 DIAGNOSIS — Z96652 Presence of left artificial knee joint: Secondary | ICD-10-CM | POA: Diagnosis present

## 2022-10-31 DIAGNOSIS — I4510 Unspecified right bundle-branch block: Secondary | ICD-10-CM | POA: Diagnosis present

## 2022-10-31 DIAGNOSIS — G309 Alzheimer's disease, unspecified: Secondary | ICD-10-CM | POA: Diagnosis present

## 2022-10-31 DIAGNOSIS — E1151 Type 2 diabetes mellitus with diabetic peripheral angiopathy without gangrene: Secondary | ICD-10-CM | POA: Diagnosis present

## 2022-10-31 DIAGNOSIS — E785 Hyperlipidemia, unspecified: Secondary | ICD-10-CM | POA: Diagnosis present

## 2022-10-31 DIAGNOSIS — Z8616 Personal history of COVID-19: Secondary | ICD-10-CM | POA: Diagnosis not present

## 2022-10-31 DIAGNOSIS — J9602 Acute respiratory failure with hypercapnia: Secondary | ICD-10-CM | POA: Diagnosis present

## 2022-10-31 DIAGNOSIS — E1122 Type 2 diabetes mellitus with diabetic chronic kidney disease: Secondary | ICD-10-CM | POA: Diagnosis present

## 2022-10-31 DIAGNOSIS — N1832 Chronic kidney disease, stage 3b: Secondary | ICD-10-CM | POA: Diagnosis present

## 2022-10-31 DIAGNOSIS — N400 Enlarged prostate without lower urinary tract symptoms: Secondary | ICD-10-CM | POA: Diagnosis present

## 2022-10-31 DIAGNOSIS — E872 Acidosis, unspecified: Secondary | ICD-10-CM | POA: Diagnosis present

## 2022-10-31 DIAGNOSIS — I708 Atherosclerosis of other arteries: Secondary | ICD-10-CM | POA: Diagnosis present

## 2022-10-31 DIAGNOSIS — I129 Hypertensive chronic kidney disease with stage 1 through stage 4 chronic kidney disease, or unspecified chronic kidney disease: Secondary | ICD-10-CM | POA: Diagnosis present

## 2022-10-31 LAB — COMPREHENSIVE METABOLIC PANEL
ALT: 19 U/L (ref 0–44)
AST: 20 U/L (ref 15–41)
Albumin: 3.2 g/dL — ABNORMAL LOW (ref 3.5–5.0)
Alkaline Phosphatase: 48 U/L (ref 38–126)
Anion gap: 10 (ref 5–15)
BUN: 19 mg/dL (ref 8–23)
CO2: 24 mmol/L (ref 22–32)
Calcium: 8.4 mg/dL — ABNORMAL LOW (ref 8.9–10.3)
Chloride: 104 mmol/L (ref 98–111)
Creatinine, Ser: 1.43 mg/dL — ABNORMAL HIGH (ref 0.61–1.24)
GFR, Estimated: 49 mL/min — ABNORMAL LOW (ref >60)
Glucose, Bld: 143 mg/dL — ABNORMAL HIGH (ref 70–99)
Potassium: 4 mmol/L (ref 3.5–5.1)
Sodium: 138 mmol/L (ref 135–145)
Total Bilirubin: 0.5 mg/dL (ref 0.3–1.2)
Total Protein: 6.2 g/dL — ABNORMAL LOW (ref 6.5–8.1)

## 2022-10-31 LAB — BLOOD CULTURE ID PANEL (REFLEXED) - BCID2

## 2022-10-31 LAB — CBC WITH DIFFERENTIAL/PLATELET
Abs Immature Granulocytes: 0.03 10*3/uL (ref 0.00–0.07)
Basophils Absolute: 0 10*3/uL (ref 0.0–0.1)
Basophils Relative: 0 %
Eosinophils Absolute: 0 10*3/uL (ref 0.0–0.5)
Eosinophils Relative: 0 %
HCT: 31.2 % — ABNORMAL LOW (ref 39.0–52.0)
Hemoglobin: 10.2 g/dL — ABNORMAL LOW (ref 13.0–17.0)
Immature Granulocytes: 0 %
Lymphocytes Relative: 10 %
Lymphs Abs: 0.9 10*3/uL (ref 0.7–4.0)
MCH: 31 pg (ref 26.0–34.0)
MCHC: 32.7 g/dL (ref 30.0–36.0)
MCV: 94.8 fL (ref 80.0–100.0)
Monocytes Absolute: 0.8 10*3/uL (ref 0.1–1.0)
Monocytes Relative: 8 %
Neutro Abs: 7.2 10*3/uL (ref 1.7–7.7)
Neutrophils Relative %: 82 %
Platelets: 158 10*3/uL (ref 150–400)
RBC: 3.29 MIL/uL — ABNORMAL LOW (ref 4.22–5.81)
RDW: 13.2 % (ref 11.5–15.5)
WBC: 9 10*3/uL (ref 4.0–10.5)
nRBC: 0 % (ref 0.0–0.2)

## 2022-10-31 LAB — CBC
HCT: 31.6 % — ABNORMAL LOW (ref 39.0–52.0)
Hemoglobin: 10.1 g/dL — ABNORMAL LOW (ref 13.0–17.0)
MCH: 29.9 pg (ref 26.0–34.0)
MCHC: 32 g/dL (ref 30.0–36.0)
MCV: 93.5 fL (ref 80.0–100.0)
Platelets: 165 10*3/uL (ref 150–400)
RBC: 3.38 MIL/uL — ABNORMAL LOW (ref 4.22–5.81)
RDW: 13.6 % (ref 11.5–15.5)
WBC: 7.4 10*3/uL (ref 4.0–10.5)
nRBC: 0 % (ref 0.0–0.2)

## 2022-10-31 LAB — GLUCOSE, CAPILLARY
Glucose-Capillary: 147 mg/dL — ABNORMAL HIGH (ref 70–99)
Glucose-Capillary: 140 mg/dL — ABNORMAL HIGH (ref 70–99)
Glucose-Capillary: 187 mg/dL — ABNORMAL HIGH (ref 70–99)
Glucose-Capillary: 156 mg/dL — ABNORMAL HIGH (ref 70–99)

## 2022-10-31 LAB — HEMOGLOBIN A1C
Hgb A1c MFr Bld: 7.7 % — ABNORMAL HIGH (ref 4.8–5.6)
Mean Plasma Glucose: 174.29 mg/dL

## 2022-10-31 LAB — VITAMIN B12: Vitamin B-12: 429 pg/mL (ref 180–914)

## 2022-10-31 LAB — BLOOD GAS, VENOUS
Acid-Base Excess: 1.3 mmol/L (ref 0.0–2.0)
Bicarbonate: 26.6 mmol/L (ref 20.0–28.0)
Drawn by: 59174
O2 Saturation: 72.1 %
Patient temperature: 36.9
pCO2, Ven: 44 mm[Hg] (ref 44–60)
pH, Ven: 7.39 (ref 7.25–7.43)
pO2, Ven: 41 mm[Hg] (ref 32–45)

## 2022-10-31 LAB — LACTIC ACID, PLASMA
Lactic Acid, Venous: 1.8 mmol/L (ref 0.5–1.9)
Lactic Acid, Venous: 1.7 mmol/L (ref 0.5–1.9)

## 2022-10-31 LAB — FOLATE: Folate: 29.7 ng/mL (ref >5.9)

## 2022-10-31 LAB — MRSA NEXT GEN BY PCR, NASAL: MRSA by PCR Next Gen: NOT DETECTED

## 2022-10-31 LAB — SEDIMENTATION RATE: Sed Rate: 27 mm/h — ABNORMAL HIGH (ref 0–16)

## 2022-10-31 LAB — CK: Total CK: 222 U/L (ref 49–397)

## 2022-10-31 LAB — MAGNESIUM: Magnesium: 1.9 mg/dL (ref 1.7–2.4)

## 2022-10-31 LAB — HIV ANTIBODY (ROUTINE TESTING W REFLEX): HIV Screen 4th Generation wRfx: NONREACTIVE

## 2022-10-31 LAB — TSH: TSH: 1.158 u[IU]/mL (ref 0.350–4.500)

## 2022-10-31 MED ORDER — TRAZODONE HCL 50 MG PO TABS: 50.0000 mg | ORAL_TABLET | Freq: Every day | ORAL | Status: DC

## 2022-10-31 MED ORDER — HALOPERIDOL LACTATE 5 MG/ML IJ SOLN: 1.0000 mg | Freq: Four times a day (QID) | INTRAMUSCULAR | Status: DC | PRN

## 2022-10-31 MED ORDER — TRAZODONE HCL 50 MG PO TABS
25.0000 mg | ORAL_TABLET | Freq: Every day | ORAL | Status: DC
  Administered 2022-10-31: 25 mg via ORAL
  Filled 2022-10-31: qty 1

## 2022-10-31 MED ORDER — LORAZEPAM 2 MG/ML IJ SOLN
0.2500 mg | Freq: Once | INTRAMUSCULAR | Status: DC | PRN
  Administered 2022-10-31: 0.25 mg via INTRAVENOUS
  Filled 2022-10-31: qty 1

## 2022-10-31 MED ORDER — METOPROLOL TARTRATE 5 MG/5ML IV SOLN: 5.0000 mg | INTRAVENOUS | Status: DC | PRN

## 2022-10-31 MED ORDER — HYDRALAZINE HCL 20 MG/ML IJ SOLN
10.0000 mg | INTRAMUSCULAR | Status: DC | PRN
  Administered 2022-10-31: 10 mg via INTRAVENOUS
  Filled 2022-10-31 (×2): qty 1

## 2022-10-31 MED ORDER — DILTIAZEM HCL ER COATED BEADS 120 MG PO CP24
240.0000 mg | ORAL_CAPSULE | Freq: Every day | ORAL | Status: DC
  Administered 2022-10-31 – 2022-11-01 (×2): 240 mg via ORAL
  Filled 2022-10-31 (×2): qty 2

## 2022-10-31 NOTE — Progress Notes (Signed)
PROGRESS NOTE    William Burke  GEX:[id number] DOB: 05/30/1940 DOA: 10/30/2022 PCP: [name], DO  Chief Complaint  Patient presents with   Shortness of Breath    Brief Narrative:    82 year old male with past medical history of HLD, Alzheimer's dementia, HTN, PAD, T2DM, GERD, BPH.  Patient lives at home with his wife.  Today he was in the room getting dressed.  His wife heard him making grunting noise.  She went in he was up against the bed, sliding over.  Per wife she grabbed him and kept calling him.  He was unable to response and slid to the floor.  911 was called, as she was unsure if he was breathing, she was directed to CPR until ambulance arrived.  When EMS arrived, patient had Astor Gentle pulse but appeared to be in respiratory distress.  He was placed on the 15 L nonrebreather.  Narcan given with no improvement in mental status.  He was sent to the ER.  The patient had COVID and flu vaccines on Tuesday.  He had RSV vaccine done Tyyonna Soucy few weeks back.  There is no reports of illnesses prior to episode.,  No shortness of breath, chest pain, fevers or chills.   In the ER patient placed on 2 L oxygen, satting over 99%.  Patient's initial lactic acid 6.3, decreased to 4.6 after 2 L IV fluid bolus.  Troponin 17 => 28 => 32.  Creatinine 1.7, baseline creatinine normal.  Glucose 221.  Respiratory panel negative, UA, CT head, CTA chest unrevealing without acute findings.  CT C-spine normal. Currently normal patient currently awake and interactive.  EKG NSR with RBBB  Assessment & Plan:   Principal Problem:   Acute respiratory failure with hypoxia (HCC) Active Problems:   History of tobacco abuse   PAD (peripheral artery disease) (HCC)   Non-insulin treated type 2 diabetes mellitus (HCC)   Essential hypertension   Dyslipidemia   Dementia (HCC)  Acute Metabolic Encephalopathy Unclear event -> wife describes shaking and what could be considered post ictal period (elevated lactic acid also would  fit within this) -> seizure on ddx.  Syncope, arrhythmia also on differential.  Echo pending CT PE protocol without PE, pulmonary arterial hypertension Head CT without acute abnormality Echo, carotid US pending EEG, MRI  orthostatics Continue tele monitoring, plan for ziopatch outpatient  PT, OT  AKI on CKD IIIb Creatinine baseline around 1.28 1.7 on presentation, improving with IVF Holding lisinopril/hydrochlorothiazide and lasix  Lactic Acidosis Resolved  Elevated Troponin  CAD  PAD  S/p Carotid Stent  S/p Right Common Iliac Artery Stent Troponin is relatively low and flat Follow echo Statin, plavix   T2DM Glipizide is on hold, actos on hold Continue basal and SSI   HTN Diltiazem Holding hydrochlorothiazide, lasix, and lisinopril   BPH Flomax  GERD PPI  Dementia  Namenda, aricept   Recent COVID and flu vaccination    DVT prophylaxis: heparin Code Status: full Family Communication: wife, daughter over phone Disposition:   Status is: Observation The patient remains OBS appropriate and will d/c before 2 midnights.   Consultants:  none  Procedures:  none  Antimicrobials:  Anti-infectives (From admission, onward)    None       Subjective: No new complaints - he's not able to tell me what happened His wife is -> while he was getting dressed, she heard grunting -> when she saw him he was shaking, she helped to the ground - he continued  to shake - no incontinence or tongue biting - shaking lasted Grey Schlauch few minutes - she started CPR per 911 - EMS came, he was awake, but remained confused  Objective: Vitals:   10/31/22 0304 10/31/22 0609 10/31/22 0650 10/31/22 0725  BP:  131/63  (!) 150/69  Pulse:  78  75  Resp:  17  17  Temp:  98 F (36.7 C)  98.5 F (36.9 C)  TempSrc:  Oral  Oral  SpO2: 92% 90% 93% 93%  Weight:      Height:        Intake/Output Summary (Last 24 hours) at 10/31/2022 1027 Last data filed at 10/31/2022 0400 Gross per 24 hour   Intake 1404.93 ml  Output --  Net 1404.93 ml   Filed Weights   10/30/22 1537 10/31/22 0300  Weight: 83.9 kg 91.3 kg    Examination:  General exam: Appears calm and comfortable  Respiratory system: Clear to auscultation. Respiratory effort normal. Cardiovascular system: RRR Gastrointestinal system: Abdomen is nondistended, soft and nontender Central nervous system: Alert and oriented. CN 2-12 intact.  Symmetric Strength throughout.  Extremities: no LEE   Data Reviewed: I have personally reviewed following labs and imaging studies  CBC: Recent Labs  Lab 10/30/22 1538 10/31/22 0242  WBC 7.9 9.0  NEUTROABS 5.6 7.2  HGB 10.9* 10.2*  HCT 34.2* 31.2*  MCV 96.6 94.8  PLT 175 158    Basic Metabolic Panel: Recent Labs  Lab 10/30/22 1538 10/31/22 0242  NA 139 138  K 3.7 4.0  CL 103 104  CO2 21* 24  GLUCOSE 221* 143*  BUN 20 19  CREATININE 1.70* 1.43*  CALCIUM 8.7* 8.4*  MG  --  1.9    GFR: Estimated Creatinine Clearance: 39.8 mL/min (Darran Gabay) (by C-G formula based on SCr of 1.43 mg/dL (H)).  Liver Function Tests: Recent Labs  Lab 10/30/22 1538 10/31/22 0242  AST 24 20  ALT 20 19  ALKPHOS 54 48  BILITOT 0.3 0.5  PROT 6.5 6.2*  ALBUMIN 3.3* 3.2*    CBG: Recent Labs  Lab 10/30/22 1958 10/30/22 2304 10/31/22 0608  GLUCAP 264* 183* 147*     Recent Results (from the past 240 hour(s))  Culture, blood (routine x 2)     Status: None (Preliminary result)   Collection Time: 10/30/22  3:41 PM   Specimen: BLOOD  Result Value Ref Range Status   Specimen Description BLOOD LEFT ANTECUBITAL  Final   Special Requests   Final    BOTTLES DRAWN AEROBIC AND ANAEROBIC Blood Culture adequate volume   Culture  Setup Time   Final    GRAM POSITIVE COCCI IN CLUSTERS ANAEROBIC BOTTLE ONLY Organism ID to follow Performed at [facility], [address] [place], [place] [address]    Culture GRAM POSITIVE COCCI  Final   Report Status PENDING  Incomplete  Culture,  blood (routine x 2)     Status: None (Preliminary result)   Collection Time: 10/30/22  3:46 PM   Specimen: BLOOD RIGHT FOREARM  Result Value Ref Range Status   Specimen Description BLOOD RIGHT FOREARM  Final   Special Requests   Final    BOTTLES DRAWN AEROBIC AND ANAEROBIC Blood Culture adequate volume   Culture   Final    NO GROWTH < 24 HOURS Performed at [facility], [address] [place], [place] [address]    Report Status PENDING  Incomplete  Resp panel by RT-PCR (RSV, Flu Tristin Gladman&B, Covid) Anterior  Nasal Swab     Status: None   Collection Time: 10/30/22  9:27 PM   Specimen: Anterior Nasal Swab  Result Value Ref Range Status   SARS Coronavirus 2 by RT PCR NEGATIVE NEGATIVE Final   Influenza Adrianne Shackleton by PCR NEGATIVE NEGATIVE Final   Influenza B by PCR NEGATIVE NEGATIVE Final    Comment: (NOTE) The Xpert Xpress SARS-CoV-2/FLU/RSV plus assay is intended as an aid in the diagnosis of influenza from Nasopharyngeal swab specimens and should not be used as Sharif Rendell sole basis for treatment. Nasal washings and aspirates are unacceptable for Xpert Xpress SARS-CoV-2/FLU/RSV testing.  Fact Sheet for Patients: BloggerCourse.com  Fact Sheet for Healthcare Providers: SeriousBroker.it  This test is not yet approved or cleared by the [place] FDA and has been authorized for detection and/or diagnosis of SARS-CoV-2 by FDA under an Emergency Use Authorization (EUA). This EUA will remain in effect (meaning this test can be used) for the duration of the COVID-19 declaration under Section 564(b)(1) of the Act, 21 U.S.C. section 360bbb-3(b)(1), unless the authorization is terminated or revoked.     Resp Syncytial Virus by PCR NEGATIVE NEGATIVE Final    Comment: (NOTE) Fact Sheet for Patients: BloggerCourse.com  Fact Sheet for Healthcare Providers: SeriousBroker.it  This test is not yet approved  or cleared by the [place] FDA and has been authorized for detection and/or diagnosis of SARS-CoV-2 by FDA under an Emergency Use Authorization (EUA). This EUA will remain in effect (meaning this test can be used) for the duration of the COVID-19 declaration under Section 564(b)(1) of the Act, 21 U.S.C. section 360bbb-3(b)(1), unless the authorization is terminated or revoked.  Performed at [facility], [address] [place], [place] [address]   MRSA Next Gen by PCR, Nasal     Status: None   Collection Time: 10/31/22  1:34 AM   Specimen: Nasal Mucosa; Nasal Swab  Result Value Ref Range Status   MRSA by PCR Next Gen NOT DETECTED NOT DETECTED Final    Comment: (NOTE) The GeneXpert MRSA Assay (FDA approved for NASAL specimens only), is one component of Anushri Casalino comprehensive MRSA colonization surveillance program. It is not intended to diagnose MRSA infection nor to guide or monitor treatment for MRSA infections. Test performance is not FDA approved in patients less than 42 years old. Performed at [facility], [address], [place], [place] [address]          Radiology Studies: CT Angio Chest PE W and/or Wo Contrast  Result Date: 10/30/2022 CLINICAL DATA:  Pulmonary embolus suspected with low to intermediate probability. Positive D-dimer. Hypoxia. EXAM: CT ANGIOGRAPHY CHEST WITH CONTRAST TECHNIQUE: Multidetector CT imaging of the chest was performed using the standard protocol during bolus administration of intravenous contrast. Multiplanar CT image reconstructions and MIPs were obtained to evaluate the vascular anatomy. RADIATION DOSE REDUCTION: This exam was performed according to the departmental dose-optimization program which includes automated exposure control, adjustment of the mA and/or kV according to patient size and/or use of iterative reconstruction technique. CONTRAST:  60mL OMNIPAQUE IOHEXOL 350 MG/ML SOLN COMPARISON:  Chest radiograph 10/30/2022 FINDINGS:  Cardiovascular: Good opacification of the central and segmental pulmonary arteries with mild motion artifact no focal filling defects. No evidence of significant pulmonary embolus. Dilated central pulmonary arteries may indicate pulmonary arterial hypertension in the appropriate clinical setting. Normal heart size. No pericardial effusions. Calcification of the aorta, coronary arteries, and mitral valve annulus. Mediastinum/Nodes: Thyroid gland is unremarkable. Esophagus is decompressed. No significant  lymphadenopathy. Lungs/Pleura: Motion artifact limits examination. There is atelectasis in the lung bases. No pleural effusions or pneumothorax. Upper Abdomen: No acute abnormalities. Prominent aortic calcification with calcific stenosis of the aorta at the level of the renal arteries representing Ronica Vivian proximally 80% diameter reduction. Musculoskeletal: Degenerative changes in the spine. Old rib fractures. No acute bony abnormalities. Review of the MIP images confirms the above findings. IMPRESSION: 1. No evidence of significant pulmonary embolus. 2. Atelectasis in the lung bases. 3. Dilated main pulmonary artery may indicate pulmonary arterial hypertension. 4. Extensive aortic atherosclerosis with evidence of 80% calcific stenosis of the upper abdominal aorta. Electronically Signed   By: Burman Nieves M.D.   On: 10/30/2022 19:10   CT Head Wo Contrast  Result Date: 10/30/2022 CLINICAL DATA:  Found unresponsive EXAM: CT HEAD WITHOUT CONTRAST CT CERVICAL SPINE WITHOUT CONTRAST TECHNIQUE: Multidetector CT imaging of the head and cervical spine was performed following the standard protocol without intravenous contrast. Multiplanar CT image reconstructions of the cervical spine were also generated. RADIATION DOSE REDUCTION: This exam was performed according to the departmental dose-optimization program which includes automated exposure control, adjustment of the mA and/or kV according to patient size and/or use of  iterative reconstruction technique. COMPARISON:  None are available. FINDINGS: CT HEAD FINDINGS Brain: Motion obscures fine detail. No intracranial hemorrhage, mass effect, or evidence of acute infarct. No hydrocephalus. No extra-axial fluid collection. Generalized cerebral atrophy and chronic small vessel ischemic disease. Vascular: No hyperdense vessel. Intracranial arterial calcification. Skull: No fracture or focal lesion. Sinuses/Orbits: Mild mucosal thickening in the paranasal sinuses. Other: Impacted left maxillary lateral incisor with periapical lucency. CT CERVICAL SPINE FINDINGS Alignment: No evidence of traumatic malalignment. Skull base and vertebrae: No acute fracture. No primary bone lesion or focal pathologic process. Soft tissues and spinal canal: No prevertebral fluid or swelling. No visible canal hematoma. Disc levels: Multilevel age-related degenerative spondylosis. No severe spinal canal narrowing. Upper chest: See separate report. Other: Carotid calcification. IMPRESSION: 1. No acute intracranial abnormality. Generalized atrophy and small vessel white matter disease. 2. No acute fracture in the cervical spine. Multilevel degenerative spondylosis. Electronically Signed   By: Minerva Fester M.D.   On: 10/30/2022 18:49   CT Cervical Spine Wo Contrast  Result Date: 10/30/2022 CLINICAL DATA:  Found unresponsive EXAM: CT HEAD WITHOUT CONTRAST CT CERVICAL SPINE WITHOUT CONTRAST TECHNIQUE: Multidetector CT imaging of the head and cervical spine was performed following the standard protocol without intravenous contrast. Multiplanar CT image reconstructions of the cervical spine were also generated. RADIATION DOSE REDUCTION: This exam was performed according to the departmental dose-optimization program which includes automated exposure control, adjustment of the mA and/or kV according to patient size and/or use of iterative reconstruction technique. COMPARISON:  None are available. FINDINGS: CT HEAD  FINDINGS Brain: Motion obscures fine detail. No intracranial hemorrhage, mass effect, or evidence of acute infarct. No hydrocephalus. No extra-axial fluid collection. Generalized cerebral atrophy and chronic small vessel ischemic disease. Vascular: No hyperdense vessel. Intracranial arterial calcification. Skull: No fracture or focal lesion. Sinuses/Orbits: Mild mucosal thickening in the paranasal sinuses. Other: Impacted left maxillary lateral incisor with periapical lucency. CT CERVICAL SPINE FINDINGS Alignment: No evidence of traumatic malalignment. Skull base and vertebrae: No acute fracture. No primary bone lesion or focal pathologic process. Soft tissues and spinal canal: No prevertebral fluid or swelling. No visible canal hematoma. Disc levels: Multilevel age-related degenerative spondylosis. No severe spinal canal narrowing. Upper chest: See separate report. Other: Carotid calcification. IMPRESSION: 1. No acute intracranial abnormality.  Generalized atrophy and small vessel white matter disease. 2. No acute fracture in the cervical spine. Multilevel degenerative spondylosis. Electronically Signed   By: Minerva Fester M.D.   On: 10/30/2022 18:49   DG Chest Portable 1 View  Result Date: 10/30/2022 CLINICAL DATA:  Pneumonia and shortness of breath EXAM: PORTABLE CHEST 1 VIEW COMPARISON:  Radiographs 08/17/2015 FINDINGS: Low lung volumes accentuate cardiomediastinal silhouette and pulmonary vascularity. Fat pad at the cardiac apex. Left-greater-than-right basilar atelectasis/scarring. No definite pleural effusion. No pneumothorax. No displaced rib fractures. IMPRESSION: Low lung volumes with left-greater-than-right basilar atelectasis/scarring. Electronically Signed   By: Minerva Fester M.D.   On: 10/30/2022 17:46        Scheduled Meds:  atorvastatin  40 mg Oral Daily   clopidogrel  75 mg Oral Daily   diltiazem  240 mg Oral Daily   donepezil  10 mg Oral QHS   heparin  5,000 Units Subcutaneous Q8H    insulin aspart  0-15 Units Subcutaneous TID WC   insulin aspart  0-5 Units Subcutaneous QHS   insulin glargine-yfgn  18 Units Subcutaneous Daily   memantine  10 mg Oral BID   pantoprazole  40 mg Oral Daily   saccharomyces boulardii  250 mg Oral BID   tamsulosin  0.4 mg Oral Daily   Continuous Infusions:  sodium chloride Stopped (10/31/22 0615)     LOS: 0 days    Time spent: over 30 min    Lacretia Nicks, MD [facility]   To contact the attending provider between 7A-7P or the covering provider during after hours 7P-7A, please log into the web site [email or web address] and access using universal  password for that web site. If you do not have the password, please call the hospital operator.  10/31/2022, 10:27 AM

## 2022-10-31 NOTE — Plan of Care (Signed)
  Problem: Nutritional: Goal: Maintenance of adequate nutrition will improve Outcome: Progressing Goal: Progress toward achieving an optimal weight will improve Outcome: Progressing   Problem: Skin Integrity: Goal: Risk for impaired skin integrity will decrease Outcome: Progressing   Problem: Tissue Perfusion: Goal: Adequacy of tissue perfusion will improve Outcome: Progressing   Problem: Nutrition: Goal: Adequate nutrition will be maintained Outcome: Progressing   Problem: Elimination: Goal: Will not experience complications related to bowel motility Outcome: Progressing Goal: Will not experience complications related to urinary retention Outcome: Progressing

## 2022-10-31 NOTE — Progress Notes (Signed)
Mobility Specialist Progress Note:   10/31/22 1442  Mobility  Activity Ambulated with assistance to bathroom  Level of Assistance Minimal assist, patient does 75% or more  Assistive Device Front wheel walker  Distance Ambulated (ft) 60 ft  Range of Motion/Exercises Active;All extremities  Activity Response Tolerated well  Mobility Referral Yes  $Mobility charge 1 Mobility  Mobility Specialist Start Time (ACUTE ONLY) 1415  Mobility Specialist Stop Time (ACUTE ONLY) 1427  Mobility Specialist Time Calculation (min) (ACUTE ONLY) 12 min    Pt received in bed, agreeable to mobility. Required minA to stand and for turning w/ RW. Asymptomatic w/ no complaints throughout. Distance limited d/t pt needing to go to BR. Pt left in BR w/ instructions to call for assistance when finished. RN aware.  D'Vante Earlene Plater Mobility Specialist Please contact via Special educational needs teacher or Rehab office at [phone]

## 2022-10-31 NOTE — Plan of Care (Signed)

## 2022-10-31 NOTE — Progress Notes (Signed)
Unable to complete orthostatic VS mobility working with patient.

## 2022-10-31 NOTE — Procedures (Signed)
Patient Name: William Burke  MRN: [id number]  Epilepsy Attending: Charlsie Quest  Referring Physician/Provider: Zigmund Daniel., MD  Date: 10/31/2022 Duration: 22.39 mins  Patient history: 82yo M with ams getting eeg to evaluate for seizure  Level of alertness: Awake  AEDs during EEG study: None  Technical aspects: This EEG study was done with scalp electrodes positioned according to the 10-20 International system of electrode placement. Electrical activity was reviewed with band pass filter of 1-70Hz , sensitivity of 7 uV/mm, display speed of 78mm/sec with a 60Hz  notched filter applied as appropriate. EEG data were recorded continuously and digitally stored.  Video monitoring was available and reviewed as appropriate.  Description: EEG showed continuous generalized 3 to 6 Hz theta-delta slowing. Hyperventilation and photic stimulation were not performed.     ABNORMALITY - Continuous slow, generalized  IMPRESSION: This study is suggestive of moderate diffuse encephalopathy. No seizures or epileptiform discharges were seen throughout the recording.  Genell Thede Annabelle Harman

## 2022-10-31 NOTE — Progress Notes (Signed)
Carotid duplex has been completed.  Exam was somewhat limited due to patient condition.   Results can be found under chart review under CV PROC. 10/31/2022 4:02 PM Buryl Bamber RVT, RDMS

## 2022-10-31 NOTE — Progress Notes (Signed)
Mobility Specialist Progress Note:   10/31/22 1449  Mobility  Activity Ambulated with assistance in hallway  Level of Assistance Contact guard assist, steadying assist  Assistive Device Front wheel walker  Distance Ambulated (ft) 200 ft  Activity Response Tolerated well  Mobility Referral Yes  $Mobility charge 1 Mobility  Mobility Specialist Start Time (ACUTE ONLY) 1430  Mobility Specialist Stop Time (ACUTE ONLY) 1438  Mobility Specialist Time Calculation (min) (ACUTE ONLY) 8 min    Received pt in BR having no complaints and agreeable to further mobility. Pt was asymptomatic throughout ambulation and returned to room w/o fault. Left on EOB w/ call bell in reach and all needs met. Family present and bed alarm on. RN aware.   D'Vante Earlene Plater Mobility Specialist Please contact via Special educational needs teacher or Rehab office at [phone]

## 2022-10-31 NOTE — Progress Notes (Signed)
EEG complete - results pending 

## 2022-10-31 NOTE — Progress Notes (Signed)
Unable to obtain orthostatic VS at this time. Patient agitated. Will attempt again at later time

## 2022-10-31 NOTE — Consult Note (Addendum)
NEUROLOGY CONSULTATION  Reason for Consult: LOC with shaking concerning for seizure-like activity  CC: Altered mental status after LOC with seizure-like activity  HPI: History is obtained from:chart, patient and wife  William Burke is an 82 y.o. male with a past medical history significant for Alzheimer's disease, hyperlipidemia, hypertension, type 2 diabetes, PAD, carotid artery disease s/p stenting on the right, GERD, and BPH who presented to the ED via EMS for episode of syncope followed by shaking of upper and lower extremities. Patient's wife states that he was getting dressed yesterday, when she heard grunting noises and witnessed the patient sliding off the bed. Patient's wife states that she noticed arms and head shaking which lasted minutes, even after he was on the ground). He was also noted to be foaming at the mouth. EMS arrived and patient had a pulse but appeared to be in respiratory distress. He was put on a 15 L nonrebreather mask and given Narcan with no change in mental status.   Upon arrival to ED, patient was placed on 2L O2 and started on IV fluids. Patient's labs included: elevated lactic acid 6.3, Cr elevated at 1.7, Calcium 8.4. Serial troponins slightly elevated at 28 -> 32. Respiratory panel negative, UA, CT head, CTA, CT C-spine and chest unremarkable.  Routine EEG revealed generalized encephalopthy with no electrographic seizures seen.      Denies any new symptoms of facial/extremity paraesthesia, numbness, tingling, weakness, dysarthria, or headaches  ROS: Pertinent items noted in HPI and remainder of comprehensive ROS otherwise negative.   Past Medical History:  Past Medical History:  Diagnosis Date   Anemia    Arthritis    osteoarthritis knees, elbow right   Carotid artery disease (HCC)    status post right carotid artery stenting 05/24/13   Complication of anesthesia    sensitive to anesthesia ,longer to awaken-"doesn't require much"   Complication of  anesthesia    "can't sleep after anesthesia wears off and i become very anxious"    Diabetes mellitus, type 2 (HCC)    GERD (gastroesophageal reflux disease)    occ   History of bronchitis    History of pneumonia    History of tobacco abuse    Hyperlipidemia    Hypertension    Memory difficulty 01/16/2020   Nocturia    PAD (peripheral artery disease) (HCC) 06/22/2007   Bilateral iliac artery PTA and stenting with iCAST. Last lower extremity arterial Dopplers 02/25/2011 right ABI 1.0 left ABI 1.1.  Bilateral carotid artery stenosis. Last carotid Dopplers 07/18/2011: Max ICA stenosis 50-69% bilaterally   UTI (lower urinary tract infection)    Wears glasses     Family History Family History  Problem Relation Age of Onset   Diabetes Mother    Hypertension Mother    Cerebral aneurysm Mother    Multiple sclerosis Father    Kidney disease Father     Allergies:  Allergies  Allergen Reactions   Norco [Hydrocodone-Acetaminophen] Other (See Comments)    Pt wife states intolerance to stronger norco meds    Social History:   reports that he quit smoking about 35 years ago. His smoking use included cigarettes. He started smoking about 60 years ago. He has a 75 pack-year smoking history. He has never used smokeless tobacco. He reports that he does not drink alcohol and does not use drugs.    Medications Medications Prior to Admission  Medication Sig Dispense Refill   atorvastatin (LIPITOR) 40 MG tablet Take 40 mg by mouth  daily.     busPIRone (BUSPAR) 10 MG tablet Take 10 mg by mouth 3 (three) times daily.     cetirizine ([employer] ALLERGY RELIEF,CETIRIZINE,) 10 MG tablet Take 10 mg by mouth daily.     clopidogrel (PLAVIX) 75 MG tablet Take 1 tablet (75 mg total) by mouth daily. 30 tablet 3   diltiazem (CARDIZEM CD) 240 MG 24 hr capsule Take 240 mg by mouth daily.     donepezil (ARICEPT) 10 MG tablet TAKE 1 TABLET BY MOUTH EVERYDAY AT BEDTIME 90 tablet 0   ferrous sulfate 325 (65 FE) MG  tablet Take 325 mg by mouth daily.     furosemide (LASIX) 20 MG tablet Take 10 mg by mouth daily.     glipiZIDE (GLUCOTROL) 10 MG tablet Take 2 tablets by mouth in the morning and at bedtime.     LANTUS SOLOSTAR 100 UNIT/ML Solostar Pen Inject 18 Units into the skin in the morning.     lisinopril-hydrochlorothiazide (PRINZIDE,ZESTORETIC) 20-12.5 MG tablet Take 1 tablet by mouth daily.  1   MAGNESIUM PO Take 1 tablet by mouth daily.     memantine (NAMENDA) 10 MG tablet TAKE 1 TABLET BY MOUTH TWICE A DAY 180 tablet 3   metFORMIN (GLUCOPHAGE) 1000 MG tablet Take 1,000 mg by mouth 2 (two) times daily with a meal.     Omega-3 Fatty Acids (FISH OIL PO) Take 300 mg by mouth daily.     omeprazole (PRILOSEC) 20 MG capsule Take 20 mg by mouth daily.  1   pioglitazone (ACTOS) 30 MG tablet Take 30 mg by mouth daily.      saccharomyces boulardii (FLORASTOR) 250 MG capsule Take 250 mg by mouth 2 (two) times daily.     Saw Palmetto 450-15 MG CAPS Take 1 capsule by mouth daily.     tamsulosin (FLOMAX) 0.4 MG CAPS capsule Take 1 tablet by mouth daily.     UNABLE TO FIND Med Name: calcium, magnesium, saw palmetto     Liraglutide (VICTOZA ) Inject into the skin. (Patient not taking: Reported on 10/30/2022)      EXAMINATION  Current vital signs:    10/31/2022   12:08 PM 10/31/2022    7:25 AM 10/31/2022    6:09 AM  Vitals with BMI  Systolic 154 150 161  Diastolic 56 69 63  Pulse 77 75 78    Examination:  GENERAL: Awake, alert in NAD HEENT: - Normocephalic and atraumatic, dry mm, no lymphadenopathy, no Thyromegally LUNGS - Regular, unlabored respirations CV - S1S2 RRR, equal pulses bilaterally. ABDOMEN - Soft, nontender, nondistended with normoactive BS Ext: warm, well perfused, intact peripheral pulses, no pedal edema Integumentary:  Skin intact on clothed exam  NEURO:  Mental Status: Alert to self, states it is August 2021 and that he is on the side of a mountain. Patient able to follow simple  commands but unable to follow complex commands such as touch thumb to ear. Language: speech is clear. Patient able to repeat "today is a sunny day."  Cranial Nerves:  II: PERRL. Visual fields full to confrontation.  III, IV, VI: EOM intact. Eyelids elevate symmetrically. Blinks to threat.  V: Sensation intact V1-3 symmetrically  VII: no facial asymmetry   VIII: hearing intact to voice IX, X: Palate elevates symmetrically. Phonation is normal.  WR:[id number] shrug 5/5 and symmetrical  XII: tongue is midline without fasciculations. Motor:  RUE:5/5      LUE:5/5 RLE: 5/5        LLE: 5/5  Tone: is normal and bulk is normal DTRs: 2+ and symmetrical throughout   Sensation- Intact to light touch Coordination: FTN intact bilaterally, no ataxia in BLE., no abnormal movements  Gait- deferred   LABS I have reviewed labs in epic and the results pertinent to this consultation are:   Lab Results  Component Value Date   LDLCALC 63 08/01/2020   Lab Results  Component Value Date   ALT 19 10/31/2022   AST 20 10/31/2022   ALKPHOS 48 10/31/2022   BILITOT 0.5 10/31/2022   Lab Results  Component Value Date   HGBA1C 7.7 (H) 10/31/2022   Lab Results  Component Value Date   WBC 9.0 10/31/2022   HGB 10.2 (L) 10/31/2022   HCT 31.2 (L) 10/31/2022   MCV 94.8 10/31/2022   PLT 158 10/31/2022   Lab Results  Component Value Date   VITAMINB12 667 01/16/2020   No results found for: "FOLATE" Lab Results  Component Value Date   NA 138 10/31/2022   K 4.0 10/31/2022   CL 104 10/31/2022   CO2 24 10/31/2022    DIAGNOSTIC IMAGING/PROCEDURES  I have reviewed the images obtained:, as below    CT-head No acute intracranial abnormality. Generalized atrophy and small vessel white matter disease.  MRI brain - awaiting, unable to complete today d/t patient moving  EEG- 10/31/2022 ABNORMALITY - Continuous slow, generalized  IMPRESSION: This study is suggestive of moderate diffuse  encephalopathy. No seizures or epileptiform discharges were seen throughout the recording.    Assessment: 82 y.o. male with a pertinent medical history of Alzheimer's, hyperlipidemia, hypertension, and type 2 diabetes, PAD and carotid artery disease s/p stenting on the right who presents after an episode of unresponsiveness.  - Exam reveals a mildly encephalopathic patient with no focal motor weakness or seizure like activity.  - CT head reveals generalized atrophy and small vessel white matter disease but no evidence of any acute abnormality.  - Patient's lactic acid elevated at arrival of 6.3 down to 1.7 on subsequent blood draw, suggestive of possible preceding seizure.  - Normal WBC and no fevers, therefore less likely due to infectious cause.  - DDx: Likelihood that patient experienced seizure is favored over other infectious or metabolic causes. No opiates on home medications list and no response to Narcan with EMS to suggest opiate overdose. Glucose 221 on arrival, therefore acute hypoglycemic episode is felt to be unlikely. Acute hypoxic/hypercarbic respiratory failure is possible given that he was not breathing independently on arrival of first responders, requiring him to be bagged initially, but this could also have been due to a seizure. Difficult to determine if patient's altered mental status is due to post-ictal state or acute delirium in the setting of Alzheimer's disease.     Impression: Acute cognitive fluctuation in the setting of dementia, vs seizure   Recommendations: - MRI Brain - Routine EEG - Will order Dementia panel - Follow up with [facility] for continued dementia care. Currently established- last seen 04/2021 by Margie Ege NP. -Delirium precautions   I have seen and examined the patient. I have formulated the assessment and recommendations. 83 y.o. male with a pertinent medical history of Alzheimer's, hyperlipidemia, hypertension, and type 2 diabetes, PAD and carotid  artery disease s/p stenting on the right who presents after an episode of unresponsiveness. Lactate elevated at 6.3 on arrival with subsequent improvement to 1.7. Exam reveals a mildly encephalopathic patient with no focal motor weakness or seizure like activity. CT head reveals generalized atrophy and  small vessel white matter disease but no evidence of any acute abnormality. Primary components of DDx are acute cognitive fluctuation in the setting of dementia, versus seizure. Recommendations as above.   Electronically signed: Dr. Caryl Pina

## 2022-10-31 NOTE — Progress Notes (Signed)
TR[facility] night cross cover note:   I was notified by RN of the patient's elevated blood pressures, with corresponding heart rates in the 70s to 80s.  His home lisinopril is being held in the setting of acute kidney injury.  I subsequently placed order for as needed IV hydralazine for systolic blood pressure greater than 180 or diastolic blood pressure greater than 110 mmHg.     Newton Pigg, DO Hospitalist

## 2022-10-31 NOTE — Progress Notes (Signed)
PHARMACY - PHYSICIAN COMMUNICATION CRITICAL VALUE ALERT - BLOOD CULTURE IDENTIFICATION (BCID)  William Burke is an 82 y.o. male who presented to [facility] on 10/30/2022 with a chief complaint of acute encephalopathy  Assessment:  1/4 bottles with Staphylococcus species, likely representative of contaminant at this time.  Name of physician (or Provider) Contacted: Dr. Skeet Simmer  Current antibiotics: None  Changes to prescribed antibiotics recommended:  Patient is on recommended antibiotics - No changes needed  Results for orders placed or performed during the hospital encounter of 10/30/22  Blood Culture ID Panel (Reflexed) (Collected: 10/30/2022  3:41 PM)  Result Value Ref Range   Enterococcus faecalis NOT DETECTED NOT DETECTED   Enterococcus Faecium NOT DETECTED NOT DETECTED   Listeria monocytogenes NOT DETECTED NOT DETECTED   Staphylococcus species DETECTED (A) NOT DETECTED   Staphylococcus aureus (BCID) NOT DETECTED NOT DETECTED   Staphylococcus epidermidis NOT DETECTED NOT DETECTED   Staphylococcus lugdunensis NOT DETECTED NOT DETECTED   Streptococcus species NOT DETECTED NOT DETECTED   Streptococcus agalactiae NOT DETECTED NOT DETECTED   Streptococcus pneumoniae NOT DETECTED NOT DETECTED   Streptococcus pyogenes NOT DETECTED NOT DETECTED   A.calcoaceticus-baumannii NOT DETECTED NOT DETECTED   Bacteroides fragilis NOT DETECTED NOT DETECTED   Enterobacterales NOT DETECTED NOT DETECTED   Enterobacter cloacae complex NOT DETECTED NOT DETECTED   Escherichia coli NOT DETECTED NOT DETECTED   Klebsiella aerogenes NOT DETECTED NOT DETECTED   Klebsiella oxytoca NOT DETECTED NOT DETECTED   Klebsiella pneumoniae NOT DETECTED NOT DETECTED   Proteus species NOT DETECTED NOT DETECTED   Salmonella species NOT DETECTED NOT DETECTED   Serratia marcescens NOT DETECTED NOT DETECTED   Haemophilus influenzae NOT DETECTED NOT DETECTED   Neisseria meningitidis NOT DETECTED NOT DETECTED    Pseudomonas aeruginosa NOT DETECTED NOT DETECTED   Stenotrophomonas maltophilia NOT DETECTED NOT DETECTED   Candida albicans NOT DETECTED NOT DETECTED   Candida auris NOT DETECTED NOT DETECTED   Candida glabrata NOT DETECTED NOT DETECTED   Candida krusei NOT DETECTED NOT DETECTED   Candida parapsilosis NOT DETECTED NOT DETECTED   Candida tropicalis NOT DETECTED NOT DETECTED   Cryptococcus neoformans/gattii NOT DETECTED NOT DETECTED    [name] 10/31/2022  12:17 PM

## 2022-11-01 DIAGNOSIS — R569 Unspecified convulsions: Secondary | ICD-10-CM | POA: Diagnosis not present

## 2022-11-01 DIAGNOSIS — R404 Transient alteration of awareness: Secondary | ICD-10-CM | POA: Diagnosis not present

## 2022-11-01 DIAGNOSIS — J9601 Acute respiratory failure with hypoxia: Secondary | ICD-10-CM | POA: Diagnosis not present

## 2022-11-01 LAB — COMPREHENSIVE METABOLIC PANEL
ALT: 20 U/L (ref 0–44)
AST: 23 U/L (ref 15–41)
Albumin: 3.3 g/dL — ABNORMAL LOW (ref 3.5–5.0)
Alkaline Phosphatase: 57 U/L (ref 38–126)
Anion gap: 9 (ref 5–15)
BUN: 16 mg/dL (ref 8–23)
CO2: 27 mmol/L (ref 22–32)
Calcium: 8.7 mg/dL — ABNORMAL LOW (ref 8.9–10.3)
Chloride: 102 mmol/L (ref 98–111)
Creatinine, Ser: 1.31 mg/dL — ABNORMAL HIGH (ref 0.61–1.24)
GFR, Estimated: 54 mL/min — ABNORMAL LOW (ref >60)
Glucose, Bld: 157 mg/dL — ABNORMAL HIGH (ref 70–99)
Potassium: 3.7 mmol/L (ref 3.5–5.1)
Sodium: 138 mmol/L (ref 135–145)
Total Bilirubin: 0.8 mg/dL (ref 0.3–1.2)
Total Protein: 6.3 g/dL — ABNORMAL LOW (ref 6.5–8.1)

## 2022-11-01 LAB — CBC WITH DIFFERENTIAL/PLATELET
Abs Immature Granulocytes: 0.04 10*3/uL (ref 0.00–0.07)
Basophils Absolute: 0 10*3/uL (ref 0.0–0.1)
Basophils Relative: 0 %
Eosinophils Absolute: 0.1 10*3/uL (ref 0.0–0.5)
Eosinophils Relative: 1 %
HCT: 32 % — ABNORMAL LOW (ref 39.0–52.0)
Hemoglobin: 10.3 g/dL — ABNORMAL LOW (ref 13.0–17.0)
Immature Granulocytes: 1 %
Lymphocytes Relative: 13 %
Lymphs Abs: 0.9 10*3/uL (ref 0.7–4.0)
MCH: 30.7 pg (ref 26.0–34.0)
MCHC: 32.2 g/dL (ref 30.0–36.0)
MCV: 95.2 fL (ref 80.0–100.0)
Monocytes Absolute: 0.6 10*3/uL (ref 0.1–1.0)
Monocytes Relative: 9 %
Neutro Abs: 5.5 10*3/uL (ref 1.7–7.7)
Neutrophils Relative %: 76 %
Platelets: 162 10*3/uL (ref 150–400)
RBC: 3.36 MIL/uL — ABNORMAL LOW (ref 4.22–5.81)
RDW: 13.5 % (ref 11.5–15.5)
WBC: 7.2 10*3/uL (ref 4.0–10.5)
nRBC: 0 % (ref 0.0–0.2)

## 2022-11-01 LAB — PHOSPHORUS: Phosphorus: 3.9 mg/dL (ref 2.5–4.6)

## 2022-11-01 LAB — GLUCOSE, CAPILLARY
Glucose-Capillary: 155 mg/dL — ABNORMAL HIGH (ref 70–99)
Glucose-Capillary: 133 mg/dL — ABNORMAL HIGH (ref 70–99)

## 2022-11-01 LAB — RPR: RPR Ser Ql: NONREACTIVE

## 2022-11-01 LAB — MAGNESIUM: Magnesium: 2 mg/dL (ref 1.7–2.4)

## 2022-11-01 NOTE — Evaluation (Signed)
Physical Therapy Evaluation Patient Details Name: William Burke MRN: [id number] DOB: 1940-06-17 Today's Date: 11/01/2022  History of Present Illness  Pt is an 82 y/o M presenting to ED on 10/3 with SOB, unresponsive episode and was given CPR by spouse, admitted for acute respiratory failuire with hypoxia. PMH includes HLD, alzheimer's, HTN, PAD, DM2, BPH   Clinical Impression  Pt admitted with above. Pt alert but sleepy with difficulty staying away during session. Pt with unsteady gait pattern and DOE with mobility with increased falls risk. At this time pt requires RW and assist for safe mobility from both a cognitive and functional stand points. Suspect once mentation and lethargy clears up pt with mobilize with less needed assist and be safe to d/c home with spouse and [facility] services. Acute PT to cont to follow.  SATURATION QUALIFICATIONS: (This note is used to comply with regulatory documentation for home oxygen)  Patient Saturations on Room Air at Rest = 87%  Patient Saturations on Room Air while Ambulating = 78%  Patient Saturations on 3 Liters of oxygen while Ambulating = 90%  Please briefly explain why patient needs home oxygen:  unable to keep SpO2 >90% on RA      If plan is discharge home, recommend the following: A little help with walking and/or transfers;A little help with bathing/dressing/bathroom;Supervision due to cognitive status;Help with stairs or ramp for entrance   Can travel by private vehicle        Equipment Recommendations None recommended by PT  Recommendations for Other Services       Functional Status Assessment Patient has had a recent decline in their functional status and demonstrates the ability to make significant improvements in function in a reasonable and predictable amount of time.     Precautions / Restrictions Precautions Precautions: Fall Restrictions Weight Bearing Restrictions: No      Mobility  Bed Mobility Overal bed mobility:  Needs Assistance Bed Mobility: Supine to Sit     Supine to sit: Max assist, HOB elevated     General bed mobility comments: pt slid down in bed, pt able to bring LEs off EOB, attempted to roll to the L however pt unable to do to onset of R shld and chest pain (wife suspects from CPR). Pt ultimately required maxA for trunk elevation with HOB elevated to achieve EOB    Transfers Overall transfer level: Needs assistance Equipment used: Rolling walker (2 wheels) Transfers: Sit to/from Stand Sit to Stand: Min assist           General transfer comment: verbal cues for hand placement, initial posterior bias    Ambulation/Gait Ambulation/Gait assistance: Min assist Gait Distance (Feet): 15 Feet (x2 (to from bathroom)) Assistive device: Rolling walker (2 wheels) Gait Pattern/deviations: Step-to pattern, Decreased stride length, Shuffle Gait velocity: dec Gait velocity interpretation: <1.31 ft/sec, indicative of household ambulator   General Gait Details: pt with noted DOE, SPO2 88-91% on 3LO2 via [place]. short shuffled steps, minA for walker management  Stairs            Wheelchair Mobility     Tilt Bed    Modified Rankin (Stroke Patients Only)       Balance Overall balance assessment: Needs assistance Sitting-balance support: Feet supported Sitting balance-Leahy Scale: Fair Sitting balance - Comments: pt with posterior bias needing to scoot all the way to the edge of bed to balance self with feet on the floor and anterior weight shift   Standing balance support: Reliant on assistive  device for balance, During functional activity Standing balance-Leahy Scale: Poor Standing balance comment: reliant on external support                             Pertinent Vitals/Pain Pain Assessment Pain Assessment: Faces Faces Pain Scale: Hurts little more Pain Location: back/R shoulder Pain Descriptors / Indicators: Discomfort Pain Intervention(s): Monitored during  session    Home Living Family/patient expects to be discharged to:: Private residence Living Arrangements: Spouse/significant other Available Help at Discharge: Family;Available 24 hours/day Type of Home: House Home Access: Stairs to enter Entrance Stairs-Rails: Can reach both Entrance Stairs-Number of Steps: 2   Home Layout: One level Home Equipment: Pharmacist, hospital (2 wheels)      Prior Function Prior Level of Function : Needs assist             Mobility Comments: didn't use AD, drove ADLs Comments: assist for LB ADLs, supervision for all other ADLs     Extremity/Trunk Assessment   Upper Extremity Assessment Upper Extremity Assessment: Defer to OT evaluation    Lower Extremity Assessment Lower Extremity Assessment: Generalized weakness    Cervical / Trunk Assessment Cervical / Trunk Assessment: Normal  Communication   Communication Communication: Hearing impairment  Cognition Arousal: Alert Behavior During Therapy: Flat affect Overall Cognitive Status: History of cognitive impairments - at baseline                                 General Comments: hx of dementia, pt alert however sleepy with difficulty keeping eyes open, falling asleep during session freq        General Comments General comments (skin integrity, edema, etc.): SpO2 at 87% on RA while sitting EOB. Pt 94% on 3LO2 via [place]. SpO2 88-91% on 3LO2 via [place] during ambulation    Exercises     Assessment/Plan    PT Assessment Patient needs continued PT services  PT Problem List Decreased strength;Decreased range of motion;Decreased activity tolerance;Decreased balance;Decreased mobility;Decreased coordination;Decreased cognition;Decreased safety awareness       PT Treatment Interventions DME instruction;Gait training;Functional mobility training;Therapeutic activities;Therapeutic exercise;Balance training;Stair training    PT Goals (Current goals can be found in the Care  Plan section)  Acute Rehab PT Goals Patient Stated Goal: didn't state PT Goal Formulation: With patient/family Time For Goal Achievement: 11/15/22 Potential to Achieve Goals: Good    Frequency Min 1X/week     Co-evaluation PT/OT/SLP Co-Evaluation/Treatment: Yes Reason for Co-Treatment: Necessary to address cognition/behavior during functional activity PT goals addressed during session: Mobility/safety with mobility OT goals addressed during session: ADL's and self-care       AM-PAC PT "6 Clicks" Mobility  Outcome Measure Help needed turning from your back to your side while in a flat bed without using bedrails?: A Lot Help needed moving from lying on your back to sitting on the side of a flat bed without using bedrails?: A Lot Help needed moving to and from a bed to a chair (including a wheelchair)?: A Little Help needed standing up from a chair using your arms (e.g., wheelchair or bedside chair)?: A Little Help needed to walk in hospital room?: A Little Help needed climbing 3-5 steps with a railing? : A Lot 6 Click Score: 15    End of Session Equipment Utilized During Treatment: Gait belt;Oxygen Activity Tolerance: Patient tolerated treatment well Patient left: in chair;with chair alarm set;with  family/visitor present;with call bell/phone within reach Nurse Communication: Mobility status (O2 saturation) PT Visit Diagnosis: Unsteadiness on feet (R26.81);Other abnormalities of gait and mobility (R26.89);Muscle weakness (generalized) (M62.81)    Time: 6213-0865 PT Time Calculation (min) (ACUTE ONLY): 33 min   Charges:   PT Evaluation $PT Eval Moderate Complexity: 1 Mod   PT General Charges $$ ACUTE PT VISIT: 1 Visit         Lewis Shock, PT, DPT Acute Rehabilitation Services Secure chat preferred Office #: [phone]   Iona Hansen 11/01/2022, 2:20 PM

## 2022-11-01 NOTE — Progress Notes (Signed)
OT Cancellation Note  Patient Details Name: [name] MRN: [id number] DOB: Sep 08, 1940   Cancelled Treatment:    Reason Eval/Treat Not Completed: Patient declined, no reason specified (pt denies OOB mobility at this time, will follow up later today as schedule permits)  Dalphine Handing 11/01/2022, 10:13 AM

## 2022-11-01 NOTE — Progress Notes (Addendum)
Neurology Progress Note  Brief HPI: 82 y.o. male with a past medical history significant for Alzheimer's disease, hyperlipidemia, hypertension, type 2 diabetes, PAD, carotid artery disease s/p stenting on the right, GERD, and BPH who presented to the ED via EMS for episode of syncope followed by shaking of upper and lower extremities. Patient's wife states that he was getting dressed yesterday, when she heard grunting noises and witnessed the patient sliding off the bed. Patient's wife states that she noticed arms and head shaking which lasted minutes, even after he was on the ground. He was also noted to be foaming at the mouth. EMS arrived and patient had a pulse but appeared to be in respiratory distress. He was put on a 15 L nonrebreather mask and given Narcan with no change in mental status. Upon arrival to ED, patient was placed on 2L O2 and started on IV fluids. Patient's labs included: elevated lactic acid 6.3, Cr elevated at 1.7, Calcium 8.4. Serial troponins slightly elevated at 28 -> 32. Respiratory panel negative, UA, CT head, CTA, CT C-spine and chest unremarkable.  Subjective: Patient seen in room. Still confused, but pleasant. Ready to eat breakfast  Exam: Vitals:   11/01/22 0345 11/01/22 0759  BP: (!) 177/80 (!) 179/81  Pulse:  90  Resp: (!) 21   Temp: 98.9 F (37.2 C) 97.6 F (36.4 C)  SpO2: (!) 87% 91%   Gen: In bed, NAD Resp: non-labored breathing, no acute distress Abd: soft, nt   NEURO:  Mental Status: Alert to self, states "I'm eating strawberries" when asked where he is. Appeared surprised when he is reoriented to the hospital, does not remember why he is here. Patient able to follow simple commands but unable to follow complex commands such as touch thumb to ear. Language: speech is clear. Patient able to repeat "today is a sunny day" and "no ifs ands or buts" Cranial Nerves:  II: PERRL. Visual fields full to confrontation.  III, IV, VI: EOM intact. Eyelids elevate  symmetrically. Blinks to threat.  V: Sensation intact V1-3 symmetrically  VII: no facial asymmetry   VIII: hearing intact to voice IX, X: Palate elevates symmetrically. Phonation is normal.  ZO:[id number] shrug 5/5 and symmetrical  XII: tongue is midline without fasciculations. Motor:  RUE:5/5      LUE:5/5 RLE: 5/5        LLE: 5/5  Tone: is normal and bulk is normal DTRs: 2+ and symmetrical throughout   Sensation- Intact to light touch Coordination: FTN intact bilaterally, no ataxia in BLE., no abnormal movements  Gait- Has some difficulty rising from a chair and transferring to bed, requiring some assistance.      Pertinent Labs: Lab Results  Component Value Date   TSH 1.158 10/31/2022   Lab Results  Component Value Date   ESRSEDRATE 27 (H) 10/31/2022   Lab Results  Component Value Date   VITAMINB12 429 10/31/2022   CBC    Component Value Date/Time   WBC 7.2 11/01/2022 0229   RBC 3.36 (L) 11/01/2022 0229   HGB 10.3 (L) 11/01/2022 0229   HCT 32.0 (L) 11/01/2022 0229   PLT 162 11/01/2022 0229   MCV 95.2 11/01/2022 0229   MCH 30.7 11/01/2022 0229   MCHC 32.2 11/01/2022 0229   RDW 13.5 11/01/2022 0229   LYMPHSABS 0.9 11/01/2022 0229   MONOABS 0.6 11/01/2022 0229   EOSABS 0.1 11/01/2022 0229   BASOSABS 0.0 11/01/2022 0229   Lab Results  Component Value Date   NA  138 11/01/2022   K 3.7 11/01/2022   CL 102 11/01/2022   CO2 27 11/01/2022   GLUCOSE 157 (H) 11/01/2022   BUN 16 11/01/2022   CREATININE 1.31 (H) 11/01/2022   GFRNONAA 54 (L) 11/01/2022     Imaging Reviewed: CT Head/CT C-spine No acute intracranial abnormality. Generalized atrophy and small vessel white matter disease. No acute fracture in the cervical spine. Multilevel degenerative spondylosis.  EEG  ABNORMALITY - Continuous slow, generalized IMPRESSION: This study is suggestive of moderate diffuse encephalopathy. No seizures or epileptiform discharges were seen throughout the  recording  Chest X-ray Low lung volumes with left-greater-than-right basilar atelectasis/scarring.  Assessment: 82 y.o. male with a pertinent medical history of Alzheimer's, hyperlipidemia, hypertension, type 2 diabetes, PAD and carotid artery disease s/p stenting on the right who presents after an episode of unresponsiveness with foaming at the mouth and collapse onto the side of his bed while getting dressed. Patient's wife states that she noticed arms and head shaking which lasted minutes, even after he was on the ground.  - Exam reveals a mildly encephalopathic patient with no focal motor weakness or seizure like activity.  - CT head reveals generalized atrophy and small vessel white matter disease but no evidence of any acute abnormality.  - Patient's lactic acid elevated at arrival of 6.3 down to 1.7 on subsequent blood draw, suggestive of possible preceding seizure. ESR is elevated at 27 - Labs: - Normal WBC and no fevers, therefore encephalopathy is less likely due to infectious cause, more likely to be due to postictal state.  - Vitamin B12 normal.   - ESR elevated at 27 - TSH normal. - RPR nonreactive.  - No electrolyte abnormalities on initial labs to suggest a provoked seizure.  - EEG: Continuous generalized slowing. No seizures or epileptiform discharges seen.  - DDx: Likelihood that patient experienced seizure is favored over other infectious or metabolic causes. No opiates on home medications list and no response to Narcan with EMS to suggest opiate overdose. Glucose 221 on arrival, therefore acute hypoglycemic episode is felt to be unlikely. Acute hypoxic/hypercarbic respiratory failure is possible given that he was not breathing independently on arrival of first responders, requiring him to be bagged initially, but this could also have been due to a seizure. Difficult to determine if patient's altered mental status is due to post-ictal state or acute delirium in the setting of  Alzheimer's disease.   - Semiology described by wife is most consistent with a seizure. No prior history of seizures. EEG does not reveal an epileptic focus. No strong indication for starting an anticonvulsant at this time as he has no prior history of seizures, but will need MRI outpatient to assess for possible epileptogenic lesion.   Impression:  Acute cognitive fluctuation in the setting of dementia, vs seizure. First time seizure is felt to be significantly more likely.   Recommendations: - MRI Brain with and without contrast to assess for possible epileptogenic lesion. This can be obtained outpatient.   - Follow up with [facility] for continued dementia care. Currently established- last seen 04/2021 by Margie Ege NP. - Delirium precautions - Inpatient and outpatient seizure precautions   Patient seen and examined by NP/APP with MD.  Elmer Picker, DNP, FNP-BC [facility] Pager: [phone]  Electronically signed: Dr. Caryl Pina

## 2022-11-01 NOTE — TOC Transition Note (Signed)
Transition of Care [facility]) - CM/SW Discharge Note   Patient Details  Name: William Burke MRN: [id number] Date of Birth: Aug 07, 1940  [facility] CM/SW Contact:  Ronny Bacon, RN Phone Number: 11/01/2022, 1:32 PM   Clinical Narrative: Patient is being discharged today. Home oxygen arranged through [facility]. HH PT/OT/RN/Aide arranged through [facility].      Final next level of care: Home w Home Health Services Barriers to Discharge: No Barriers Identified   Patient Goals and CMS Choice      Discharge Placement                         Discharge Plan and Services Additional resources added to the After Visit Summary for                  DME Arranged: Oxygen DME Agency: [employer] Date DME Agency Contacted: 11/01/22 Time DME Agency Contacted: 1232 Representative spoke with at DME Agency: Vaughan Basta HH Arranged: PT, OT, RN, Nurse's Aide HH Agency: [facility] Date [facility] Agency Contacted: 11/01/22 Time HH Agency Contacted: [phone] Representative spoke with at [facility] Agency: [facility]  Social Determinants of Health (SDOH) Interventions SDOH Screenings   Food Insecurity: No Food Insecurity (10/31/2022)  Housing: Low Risk  (10/31/2022)  Transportation Needs: No Transportation Needs (10/31/2022)  Utilities: Not At Risk (10/31/2022)  Tobacco Use: Medium Risk (10/30/2022)     Readmission Risk Interventions     No data to display

## 2022-11-01 NOTE — Evaluation (Signed)
Occupational Therapy Evaluation Patient Details Name: William Burke MRN: [id number] DOB: [birth year] Today's Date: 11/01/2022   History of Present Illness Pt is an 82 y/o M presenting to ED on 10/3 with SOB, unresponsive episode and was given CPR by spouse, admitted for acute respiratory failuire with hypoxia. PMH includes HLD, alzheimer's, HTN, PAD, DM2, BPH   Clinical Impression   PTA, pt having assist/supervision for ADLs, and min A for transfers with RW. SpO2 down to mid 80's on 3L with poor wave pleth. Pt able to ambulate to bathroom, needs cues for being close to commode as pt partially urinating on floor. Pt presenting with impairments listed below, will follow acutely. Recommend HHOT at d/c.       If plan is discharge home, recommend the following: A little help with walking and/or transfers;A lot of help with bathing/dressing/bathroom;Assistance with cooking/housework;Direct supervision/assist for medications management;Direct supervision/assist for financial management;Assist for transportation;Help with stairs or ramp for entrance;Supervision due to cognitive status    Functional Status Assessment  Patient has had a recent decline in their functional status and demonstrates the ability to make significant improvements in function in a reasonable and predictable amount of time.  Equipment Recommendations  None recommended by OT (pt has all needed DME)    Recommendations for Other Services PT consult     Precautions / Restrictions Precautions Precautions: Fall Restrictions Weight Bearing Restrictions: No      Mobility Bed Mobility               General bed mobility comments: seated EOB with PT upon arrival    Transfers Overall transfer level: Needs assistance Equipment used: Rolling walker (2 wheels) Transfers: Sit to/from Stand Sit to Stand: Min assist                  Balance Overall balance assessment: Needs assistance Sitting-balance support:  Feet supported Sitting balance-Leahy Scale: Good     Standing balance support: Reliant on assistive device for balance, During functional activity Standing balance-Leahy Scale: Poor Standing balance comment: reliant on external support                           ADL either performed or assessed with clinical judgement   ADL Overall ADL's : Needs assistance/impaired Eating/Feeding: Set up   Grooming: Set up   Upper Body Bathing: Minimal assistance   Lower Body Bathing: Maximal assistance   Upper Body Dressing : Minimal assistance   Lower Body Dressing: Maximal assistance   Toilet Transfer: Minimal assistance;Ambulation;Regular Toilet   Toileting- Clothing Manipulation and Hygiene: Minimal assistance       Functional mobility during ADLs: Minimal assistance;Rolling walker (2 wheels)       Vision Baseline Vision/History: 1 Wears glasses       Perception Perception: Not tested       Praxis Praxis: Not tested       Pertinent Vitals/Pain Pain Assessment Pain Assessment: Faces Pain Score: 3  Faces Pain Scale: Hurts little more Pain Location: back/R shoulder Pain Descriptors / Indicators: Discomfort Pain Intervention(s): Limited activity within patient's tolerance, Monitored during session, Repositioned     Extremity/Trunk Assessment Upper Extremity Assessment Upper Extremity Assessment: Generalized weakness   Lower Extremity Assessment Lower Extremity Assessment: Defer to PT evaluation   Cervical / Trunk Assessment Cervical / Trunk Assessment: Normal   Communication Communication Communication: No apparent difficulties   Cognition Arousal: Alert Behavior During Therapy: WFL for tasks assessed/performed Overall Cognitive Status:  History of cognitive impairments - at baseline                                 General Comments: hx of dementia     General Comments  SpO2 mid 80s on 3L O2    Exercises     Shoulder Instructions       Home Living Family/patient expects to be discharged to:: Private residence Living Arrangements: Spouse/significant other Available Help at Discharge: Family;Available 24 hours/day Type of Home: House Home Access: Stairs to enter     Home Layout: One level     Bathroom Shower/Tub: Chief Strategy Officer: Standard     Home Equipment: Pharmacist, hospital (2 wheels)          Prior Functioning/Environment Prior Level of Function : Needs assist               ADLs Comments: assist for LB ADLs, supervision for all other ADLs        OT Problem List: Decreased strength;Decreased range of motion;Decreased activity tolerance;Impaired balance (sitting and/or standing);Decreased cognition;Decreased safety awareness      OT Treatment/Interventions: Self-care/ADL training;Therapeutic exercise;Energy conservation;DME and/or AE instruction;Therapeutic activities;Patient/family education;Balance training;Cognitive remediation/compensation    OT Goals(Current goals can be found in the care plan section) Acute Rehab OT Goals Patient Stated Goal: none stated OT Goal Formulation: With patient Time For Goal Achievement: 11/15/22 Potential to Achieve Goals: Good ADL Goals Pt Will Perform Upper Body Dressing: with supervision;sitting Pt Will Perform Lower Body Dressing: with supervision;sit to/from stand;sitting/lateral leans Pt Will Transfer to Toilet: with supervision;ambulating;regular height toilet Pt Will Perform Tub/Shower Transfer: Tub transfer;Shower transfer;with supervision;ambulating  OT Frequency: Min 1X/week    Co-evaluation PT/OT/SLP Co-Evaluation/Treatment: Yes Reason for Co-Treatment: Necessary to address cognition/behavior during functional activity   OT goals addressed during session: ADL's and self-care      AM-PAC OT "6 Clicks" Daily Activity     Outcome Measure Help from another person eating meals?: A Little Help from another person  taking care of personal grooming?: A Little Help from another person toileting, which includes using toliet, bedpan, or urinal?: A Lot Help from another person bathing (including washing, rinsing, drying)?: A Lot Help from another person to put on and taking off regular upper body clothing?: A Little Help from another person to put on and taking off regular lower body clothing?: A Lot 6 Click Score: 15   End of Session Equipment Utilized During Treatment: Gait belt;Rolling walker (2 wheels);Oxygen Nurse Communication: Mobility status  Activity Tolerance: Patient tolerated treatment well Patient left: in chair;with call bell/phone within reach;with chair alarm set;with family/visitor present;with nursing/sitter in room  OT Visit Diagnosis: Other abnormalities of gait and mobility (R26.89);Unsteadiness on feet (R26.81);Muscle weakness (generalized) (M62.81)                Time: 6269-4854 OT Time Calculation (min): 26 min Charges:  OT General Charges $OT Visit: 1 Visit OT Evaluation $OT Eval Moderate Complexity: 1 Mod  William Burke, OTD, OTR/L SecureChat Preferred Acute Rehab [phone]   William Burke 11/01/2022, 1:14 PM

## 2022-11-01 NOTE — Discharge Summary (Addendum)
Physician Discharge Summary  William Burke EXB:[id number] DOB: 1940/04/29 DOA: 10/30/2022  PCP: [name], DO  Admit date: 10/30/2022 Discharge date: 11/01/2022  Time spent: 40 minutes  Recommendations for Outpatient Follow-up:  Follow outpatient CBC/CMP  Follow with neurology outpatient, outpatient MRI  Follow ziopatch outpatient  Follow dementia outpatient  Follow O2 needs outpatient, pulmonology referral -> smoking hx, consider COPD, needs further w/u, consider sleep study  Discharge Diagnoses:  Principal Problem:   Acute respiratory failure with hypoxia (HCC) Active Problems:   History of tobacco abuse   PAD (peripheral artery disease) (HCC)   Non-insulin treated type 2 diabetes mellitus (HCC)   Essential hypertension   Dyslipidemia   Dementia (HCC)   AMS (altered mental status)   Discharge Condition: stable  Diet recommendation: heart healthy  Filed Weights   10/30/22 1537 10/31/22 0300  Weight: 83.9 kg 91.3 kg    History of present illness:   82 year old male with past medical history of HLD, Alzheimer's dementia, HTN, PAD, T2DM, GERD, BPH.  Patient lives at home with his wife.  He presented to the hospital after an episode of LOC.  Described to me as period of unresponsiveness with shaking of upper extremity and head which (shaking lasted 2-3 minutes).  Wife unsure if shaking of lower extremities.  His wife called 911 and was unsure if he was breathing and was directed to start CPR.  Patient was in respiratory distress on presentation of EMS and was placed on 15 L NRB.  Didn't respond to narcan.  He was admitted with AMS.  CT PE protocol was negative for PE, head CT without acute abnormality.  He was admitted for his AMS.  Has gradually improved with supportive care.  Neurology c/s with concern for possible seizure.  He didn't tolerate MRI, EEG without seizure activity.  Requiring 3 L [place] with activity.  Will plan to discharge with outpatient neurology follow up,  ziopatch.  Home O2 and Home health services.    Hospital Course:  Assessment and Plan:  Acute Metabolic Encephalopathy Unclear event -> wife describes shaking and what could be considered post ictal period (elevated lactic acid also would fit within this) -> seizure on ddx and seems to be most likely potential cause.  Syncope, arrhythmia, cardiac event also on differential.  Patient with recent echo in July, will defer repeat at this time (EF 60-65^, no RWMA, grade 1 diastolic dysfunction) CT PE protocol without PE, pulmonary arterial hypertension Head CT without acute abnormality carotid US with 1-39% stenosis on R and 60-79% stenosis in L ICA - bidirectional flow of R vertebral artery, antegrade flow for L vertebral artery EEG - moderate diffuse encephalopathy, MRI not done as he didn't tolerate this Continue tele monitoring, plan for ziopatch outpatient  PT, OT -> recommending home health  Appreciate neurology assistance -> MRI brain outpatient, follow with [facility] for dementia care   Per [facility] statutes, patients with seizures are not allowed to drive until  they have been seizure-free for six months. Use caution when using heavy equipment or power tools. Avoid working on ladders or at heights. Take showers instead of baths. Ensure the water temperature is not too high on the home water heater. Do not go swimming alone. When caring for infants or small children, sit down when holding, feeding, or changing them to minimize risk of injury to the child in the event you have Kassim Guertin seizure. Also, Maintain good sleep hygiene. Avoid alcohol.  Acute Hypoxic Resp Failure  Physician Discharge Summary  William Burke EXB:[id number] DOB: 1940/04/29 DOA: 10/30/2022  PCP: [name], DO  Admit date: 10/30/2022 Discharge date: 11/01/2022  Time spent: 40 minutes  Recommendations for Outpatient Follow-up:  Follow outpatient CBC/CMP  Follow with neurology outpatient, outpatient MRI  Follow ziopatch outpatient  Follow dementia outpatient  Follow O2 needs outpatient, pulmonology referral -> smoking hx, consider COPD, needs further w/u, consider sleep study  Discharge Diagnoses:  Principal Problem:   Acute respiratory failure with hypoxia (HCC) Active Problems:   History of tobacco abuse   PAD (peripheral artery disease) (HCC)   Non-insulin treated type 2 diabetes mellitus (HCC)   Essential hypertension   Dyslipidemia   Dementia (HCC)   AMS (altered mental status)   Discharge Condition: stable  Diet recommendation: heart healthy  Filed Weights   10/30/22 1537 10/31/22 0300  Weight: 83.9 kg 91.3 kg    History of present illness:   82 year old male with past medical history of HLD, Alzheimer's dementia, HTN, PAD, T2DM, GERD, BPH.  Patient lives at home with his wife.  He presented to the hospital after an episode of LOC.  Described to me as period of unresponsiveness with shaking of upper extremity and head which (shaking lasted 2-3 minutes).  Wife unsure if shaking of lower extremities.  His wife called 911 and was unsure if he was breathing and was directed to start CPR.  Patient was in respiratory distress on presentation of EMS and was placed on 15 L NRB.  Didn't respond to narcan.  He was admitted with AMS.  CT PE protocol was negative for PE, head CT without acute abnormality.  He was admitted for his AMS.  Has gradually improved with supportive care.  Neurology c/s with concern for possible seizure.  He didn't tolerate MRI, EEG without seizure activity.  Requiring 3 L [place] with activity.  Will plan to discharge with outpatient neurology follow up,  ziopatch.  Home O2 and Home health services.    Hospital Course:  Assessment and Plan:  Acute Metabolic Encephalopathy Unclear event -> wife describes shaking and what could be considered post ictal period (elevated lactic acid also would fit within this) -> seizure on ddx and seems to be most likely potential cause.  Syncope, arrhythmia, cardiac event also on differential.  Patient with recent echo in July, will defer repeat at this time (EF 60-65^, no RWMA, grade 1 diastolic dysfunction) CT PE protocol without PE, pulmonary arterial hypertension Head CT without acute abnormality carotid US with 1-39% stenosis on R and 60-79% stenosis in L ICA - bidirectional flow of R vertebral artery, antegrade flow for L vertebral artery EEG - moderate diffuse encephalopathy, MRI not done as he didn't tolerate this Continue tele monitoring, plan for ziopatch outpatient  PT, OT -> recommending home health  Appreciate neurology assistance -> MRI brain outpatient, follow with [facility] for dementia care   Per [facility] statutes, patients with seizures are not allowed to drive until  they have been seizure-free for six months. Use caution when using heavy equipment or power tools. Avoid working on ladders or at heights. Take showers instead of baths. Ensure the water temperature is not too high on the home water heater. Do not go swimming alone. When caring for infants or small children, sit down when holding, feeding, or changing them to minimize risk of injury to the child in the event you have Kassim Guertin seizure. Also, Maintain good sleep hygiene. Avoid alcohol.  Acute Hypoxic Resp Failure  Physician Discharge Summary  William Burke EXB:[id number] DOB: 1940/04/29 DOA: 10/30/2022  PCP: [name], DO  Admit date: 10/30/2022 Discharge date: 11/01/2022  Time spent: 40 minutes  Recommendations for Outpatient Follow-up:  Follow outpatient CBC/CMP  Follow with neurology outpatient, outpatient MRI  Follow ziopatch outpatient  Follow dementia outpatient  Follow O2 needs outpatient, pulmonology referral -> smoking hx, consider COPD, needs further w/u, consider sleep study  Discharge Diagnoses:  Principal Problem:   Acute respiratory failure with hypoxia (HCC) Active Problems:   History of tobacco abuse   PAD (peripheral artery disease) (HCC)   Non-insulin treated type 2 diabetes mellitus (HCC)   Essential hypertension   Dyslipidemia   Dementia (HCC)   AMS (altered mental status)   Discharge Condition: stable  Diet recommendation: heart healthy  Filed Weights   10/30/22 1537 10/31/22 0300  Weight: 83.9 kg 91.3 kg    History of present illness:   82 year old male with past medical history of HLD, Alzheimer's dementia, HTN, PAD, T2DM, GERD, BPH.  Patient lives at home with his wife.  He presented to the hospital after an episode of LOC.  Described to me as period of unresponsiveness with shaking of upper extremity and head which (shaking lasted 2-3 minutes).  Wife unsure if shaking of lower extremities.  His wife called 911 and was unsure if he was breathing and was directed to start CPR.  Patient was in respiratory distress on presentation of EMS and was placed on 15 L NRB.  Didn't respond to narcan.  He was admitted with AMS.  CT PE protocol was negative for PE, head CT without acute abnormality.  He was admitted for his AMS.  Has gradually improved with supportive care.  Neurology c/s with concern for possible seizure.  He didn't tolerate MRI, EEG without seizure activity.  Requiring 3 L [place] with activity.  Will plan to discharge with outpatient neurology follow up,  ziopatch.  Home O2 and Home health services.    Hospital Course:  Assessment and Plan:  Acute Metabolic Encephalopathy Unclear event -> wife describes shaking and what could be considered post ictal period (elevated lactic acid also would fit within this) -> seizure on ddx and seems to be most likely potential cause.  Syncope, arrhythmia, cardiac event also on differential.  Patient with recent echo in July, will defer repeat at this time (EF 60-65^, no RWMA, grade 1 diastolic dysfunction) CT PE protocol without PE, pulmonary arterial hypertension Head CT without acute abnormality carotid US with 1-39% stenosis on R and 60-79% stenosis in L ICA - bidirectional flow of R vertebral artery, antegrade flow for L vertebral artery EEG - moderate diffuse encephalopathy, MRI not done as he didn't tolerate this Continue tele monitoring, plan for ziopatch outpatient  PT, OT -> recommending home health  Appreciate neurology assistance -> MRI brain outpatient, follow with [facility] for dementia care   Per [facility] statutes, patients with seizures are not allowed to drive until  they have been seizure-free for six months. Use caution when using heavy equipment or power tools. Avoid working on ladders or at heights. Take showers instead of baths. Ensure the water temperature is not too high on the home water heater. Do not go swimming alone. When caring for infants or small children, sit down when holding, feeding, or changing them to minimize risk of injury to the child in the event you have Kassim Guertin seizure. Also, Maintain good sleep hygiene. Avoid alcohol.  Acute Hypoxic Resp Failure  mouth 2 (two) times daily with Mikeyla Music meal.   omeprazole 20 MG capsule Commonly known as: PRILOSEC Take 20 mg by mouth daily.   pioglitazone 30 MG tablet Commonly known as: ACTOS Take 30 mg by mouth daily.   saccharomyces boulardii 250 MG capsule Commonly known as: FLORASTOR Take 250 mg by mouth 2 (two) times daily.   Saw Palmetto 450-15 MG Caps Take 1 capsule by mouth daily.   tamsulosin 0.4 MG Caps capsule Commonly known as: FLOMAX Take 1 tablet by mouth daily.   UNABLE TO FIND Med Name: calcium, magnesium, saw palmetto   VICTOZA Mathis Inject into the skin.               Durable Medical Equipment  (From admission, onward)           Start     Ordered   11/01/22 1218  DME Oxygen  Once       Comments: Hypoxic to 86% on RA with activity.  Required 3 L to maintain sats over 90% on RA.  Question Answer Comment  Length of Need 12 Months   Liters per Minute 3   Frequency Continuous (stationary and portable oxygen unit needed)   Oxygen conserving device Yes   Oxygen delivery system Gas      11/01/22 1218           Allergies  Allergen Reactions   Norco [Hydrocodone-Acetaminophen] Other (See Comments)    Pt wife states intolerance to stronger norco meds      The results of significant diagnostics from this hospitalization (including imaging, microbiology, ancillary and laboratory) are listed below for reference.    Significant Diagnostic Studies: VAS US CAROTID  Result Date: 10/31/2022 Carotid Arterial Duplex Study Patient Name:  William Burke  Date of Exam:   10/31/2022 Medical Rec #: [id number]       Accession #:    [id number] Date of Birth: [birth year]      Patient Gender: M Patient Age:   74 years Exam Location:  [facility]  Procedure:      VAS US CAROTID Referring Phys: DEBBY CROSLEY --------------------------------------------------------------------------------  Indications:       Syncope. Risk Factors:      Hypertension, hyperlipidemia, Diabetes, past history of                    smoking, PAD. Other Factors:     RT carotid stent placement 2015. Limitations        Today's exam was limited due to the patient's inability or                    unwillingness to cooperate (agitation & continuous movement),                    the patient's erratic breathing, heavy calcification and the                    resulting diffuse shadowing, the body habitus of the patient. Comparison Study:  Previous exam 07/05/2022 RT 1-39% LT 40-59% Performing Technologist: Jody Hill RVT, RDMS  Examination Guidelines: Tremon Sainvil complete evaluation includes B-mode imaging, spectral Doppler, color Doppler, and power Doppler as needed of all accessible portions of each vessel. Bilateral testing is considered an integral part of Diantha Paxson complete examination. Limited examinations for reoccurring indications may be performed as noted.  Right Carotid Findings: +----------+--------+--------+--------+-------------------------------+--------+           PSV cm/sEDV cm/sStenosisPlaque Description  mouth 2 (two) times daily with Mikeyla Music meal.   omeprazole 20 MG capsule Commonly known as: PRILOSEC Take 20 mg by mouth daily.   pioglitazone 30 MG tablet Commonly known as: ACTOS Take 30 mg by mouth daily.   saccharomyces boulardii 250 MG capsule Commonly known as: FLORASTOR Take 250 mg by mouth 2 (two) times daily.   Saw Palmetto 450-15 MG Caps Take 1 capsule by mouth daily.   tamsulosin 0.4 MG Caps capsule Commonly known as: FLOMAX Take 1 tablet by mouth daily.   UNABLE TO FIND Med Name: calcium, magnesium, saw palmetto   VICTOZA Mathis Inject into the skin.               Durable Medical Equipment  (From admission, onward)           Start     Ordered   11/01/22 1218  DME Oxygen  Once       Comments: Hypoxic to 86% on RA with activity.  Required 3 L to maintain sats over 90% on RA.  Question Answer Comment  Length of Need 12 Months   Liters per Minute 3   Frequency Continuous (stationary and portable oxygen unit needed)   Oxygen conserving device Yes   Oxygen delivery system Gas      11/01/22 1218           Allergies  Allergen Reactions   Norco [Hydrocodone-Acetaminophen] Other (See Comments)    Pt wife states intolerance to stronger norco meds      The results of significant diagnostics from this hospitalization (including imaging, microbiology, ancillary and laboratory) are listed below for reference.    Significant Diagnostic Studies: VAS US CAROTID  Result Date: 10/31/2022 Carotid Arterial Duplex Study Patient Name:  William Burke  Date of Exam:   10/31/2022 Medical Rec #: [id number]       Accession #:    [id number] Date of Birth: [birth year]      Patient Gender: M Patient Age:   74 years Exam Location:  [facility]  Procedure:      VAS US CAROTID Referring Phys: DEBBY CROSLEY --------------------------------------------------------------------------------  Indications:       Syncope. Risk Factors:      Hypertension, hyperlipidemia, Diabetes, past history of                    smoking, PAD. Other Factors:     RT carotid stent placement 2015. Limitations        Today's exam was limited due to the patient's inability or                    unwillingness to cooperate (agitation & continuous movement),                    the patient's erratic breathing, heavy calcification and the                    resulting diffuse shadowing, the body habitus of the patient. Comparison Study:  Previous exam 07/05/2022 RT 1-39% LT 40-59% Performing Technologist: Jody Hill RVT, RDMS  Examination Guidelines: Tremon Sainvil complete evaluation includes B-mode imaging, spectral Doppler, color Doppler, and power Doppler as needed of all accessible portions of each vessel. Bilateral testing is considered an integral part of Diantha Paxson complete examination. Limited examinations for reoccurring indications may be performed as noted.  Right Carotid Findings: +----------+--------+--------+--------+-------------------------------+--------+           PSV cm/sEDV cm/sStenosisPlaque Description  Physician Discharge Summary  William Burke EXB:[id number] DOB: 1940/04/29 DOA: 10/30/2022  PCP: [name], DO  Admit date: 10/30/2022 Discharge date: 11/01/2022  Time spent: 40 minutes  Recommendations for Outpatient Follow-up:  Follow outpatient CBC/CMP  Follow with neurology outpatient, outpatient MRI  Follow ziopatch outpatient  Follow dementia outpatient  Follow O2 needs outpatient, pulmonology referral -> smoking hx, consider COPD, needs further w/u, consider sleep study  Discharge Diagnoses:  Principal Problem:   Acute respiratory failure with hypoxia (HCC) Active Problems:   History of tobacco abuse   PAD (peripheral artery disease) (HCC)   Non-insulin treated type 2 diabetes mellitus (HCC)   Essential hypertension   Dyslipidemia   Dementia (HCC)   AMS (altered mental status)   Discharge Condition: stable  Diet recommendation: heart healthy  Filed Weights   10/30/22 1537 10/31/22 0300  Weight: 83.9 kg 91.3 kg    History of present illness:   82 year old male with past medical history of HLD, Alzheimer's dementia, HTN, PAD, T2DM, GERD, BPH.  Patient lives at home with his wife.  He presented to the hospital after an episode of LOC.  Described to me as period of unresponsiveness with shaking of upper extremity and head which (shaking lasted 2-3 minutes).  Wife unsure if shaking of lower extremities.  His wife called 911 and was unsure if he was breathing and was directed to start CPR.  Patient was in respiratory distress on presentation of EMS and was placed on 15 L NRB.  Didn't respond to narcan.  He was admitted with AMS.  CT PE protocol was negative for PE, head CT without acute abnormality.  He was admitted for his AMS.  Has gradually improved with supportive care.  Neurology c/s with concern for possible seizure.  He didn't tolerate MRI, EEG without seizure activity.  Requiring 3 L [place] with activity.  Will plan to discharge with outpatient neurology follow up,  ziopatch.  Home O2 and Home health services.    Hospital Course:  Assessment and Plan:  Acute Metabolic Encephalopathy Unclear event -> wife describes shaking and what could be considered post ictal period (elevated lactic acid also would fit within this) -> seizure on ddx and seems to be most likely potential cause.  Syncope, arrhythmia, cardiac event also on differential.  Patient with recent echo in July, will defer repeat at this time (EF 60-65^, no RWMA, grade 1 diastolic dysfunction) CT PE protocol without PE, pulmonary arterial hypertension Head CT without acute abnormality carotid US with 1-39% stenosis on R and 60-79% stenosis in L ICA - bidirectional flow of R vertebral artery, antegrade flow for L vertebral artery EEG - moderate diffuse encephalopathy, MRI not done as he didn't tolerate this Continue tele monitoring, plan for ziopatch outpatient  PT, OT -> recommending home health  Appreciate neurology assistance -> MRI brain outpatient, follow with [facility] for dementia care   Per [facility] statutes, patients with seizures are not allowed to drive until  they have been seizure-free for six months. Use caution when using heavy equipment or power tools. Avoid working on ladders or at heights. Take showers instead of baths. Ensure the water temperature is not too high on the home water heater. Do not go swimming alone. When caring for infants or small children, sit down when holding, feeding, or changing them to minimize risk of injury to the child in the event you have Kassim Guertin seizure. Also, Maintain good sleep hygiene. Avoid alcohol.  Acute Hypoxic Resp Failure  mouth 2 (two) times daily with Mikeyla Music meal.   omeprazole 20 MG capsule Commonly known as: PRILOSEC Take 20 mg by mouth daily.   pioglitazone 30 MG tablet Commonly known as: ACTOS Take 30 mg by mouth daily.   saccharomyces boulardii 250 MG capsule Commonly known as: FLORASTOR Take 250 mg by mouth 2 (two) times daily.   Saw Palmetto 450-15 MG Caps Take 1 capsule by mouth daily.   tamsulosin 0.4 MG Caps capsule Commonly known as: FLOMAX Take 1 tablet by mouth daily.   UNABLE TO FIND Med Name: calcium, magnesium, saw palmetto   VICTOZA Mathis Inject into the skin.               Durable Medical Equipment  (From admission, onward)           Start     Ordered   11/01/22 1218  DME Oxygen  Once       Comments: Hypoxic to 86% on RA with activity.  Required 3 L to maintain sats over 90% on RA.  Question Answer Comment  Length of Need 12 Months   Liters per Minute 3   Frequency Continuous (stationary and portable oxygen unit needed)   Oxygen conserving device Yes   Oxygen delivery system Gas      11/01/22 1218           Allergies  Allergen Reactions   Norco [Hydrocodone-Acetaminophen] Other (See Comments)    Pt wife states intolerance to stronger norco meds      The results of significant diagnostics from this hospitalization (including imaging, microbiology, ancillary and laboratory) are listed below for reference.    Significant Diagnostic Studies: VAS US CAROTID  Result Date: 10/31/2022 Carotid Arterial Duplex Study Patient Name:  William Burke  Date of Exam:   10/31/2022 Medical Rec #: [id number]       Accession #:    [id number] Date of Birth: [birth year]      Patient Gender: M Patient Age:   74 years Exam Location:  [facility]  Procedure:      VAS US CAROTID Referring Phys: DEBBY CROSLEY --------------------------------------------------------------------------------  Indications:       Syncope. Risk Factors:      Hypertension, hyperlipidemia, Diabetes, past history of                    smoking, PAD. Other Factors:     RT carotid stent placement 2015. Limitations        Today's exam was limited due to the patient's inability or                    unwillingness to cooperate (agitation & continuous movement),                    the patient's erratic breathing, heavy calcification and the                    resulting diffuse shadowing, the body habitus of the patient. Comparison Study:  Previous exam 07/05/2022 RT 1-39% LT 40-59% Performing Technologist: Jody Hill RVT, RDMS  Examination Guidelines: Tremon Sainvil complete evaluation includes B-mode imaging, spectral Doppler, color Doppler, and power Doppler as needed of all accessible portions of each vessel. Bilateral testing is considered an integral part of Diantha Paxson complete examination. Limited examinations for reoccurring indications may be performed as noted.  Right Carotid Findings: +----------+--------+--------+--------+-------------------------------+--------+           PSV cm/sEDV cm/sStenosisPlaque Description  Physician Discharge Summary  William Burke EXB:[id number] DOB: 1940/04/29 DOA: 10/30/2022  PCP: [name], DO  Admit date: 10/30/2022 Discharge date: 11/01/2022  Time spent: 40 minutes  Recommendations for Outpatient Follow-up:  Follow outpatient CBC/CMP  Follow with neurology outpatient, outpatient MRI  Follow ziopatch outpatient  Follow dementia outpatient  Follow O2 needs outpatient, pulmonology referral -> smoking hx, consider COPD, needs further w/u, consider sleep study  Discharge Diagnoses:  Principal Problem:   Acute respiratory failure with hypoxia (HCC) Active Problems:   History of tobacco abuse   PAD (peripheral artery disease) (HCC)   Non-insulin treated type 2 diabetes mellitus (HCC)   Essential hypertension   Dyslipidemia   Dementia (HCC)   AMS (altered mental status)   Discharge Condition: stable  Diet recommendation: heart healthy  Filed Weights   10/30/22 1537 10/31/22 0300  Weight: 83.9 kg 91.3 kg    History of present illness:   82 year old male with past medical history of HLD, Alzheimer's dementia, HTN, PAD, T2DM, GERD, BPH.  Patient lives at home with his wife.  He presented to the hospital after an episode of LOC.  Described to me as period of unresponsiveness with shaking of upper extremity and head which (shaking lasted 2-3 minutes).  Wife unsure if shaking of lower extremities.  His wife called 911 and was unsure if he was breathing and was directed to start CPR.  Patient was in respiratory distress on presentation of EMS and was placed on 15 L NRB.  Didn't respond to narcan.  He was admitted with AMS.  CT PE protocol was negative for PE, head CT without acute abnormality.  He was admitted for his AMS.  Has gradually improved with supportive care.  Neurology c/s with concern for possible seizure.  He didn't tolerate MRI, EEG without seizure activity.  Requiring 3 L [place] with activity.  Will plan to discharge with outpatient neurology follow up,  ziopatch.  Home O2 and Home health services.    Hospital Course:  Assessment and Plan:  Acute Metabolic Encephalopathy Unclear event -> wife describes shaking and what could be considered post ictal period (elevated lactic acid also would fit within this) -> seizure on ddx and seems to be most likely potential cause.  Syncope, arrhythmia, cardiac event also on differential.  Patient with recent echo in July, will defer repeat at this time (EF 60-65^, no RWMA, grade 1 diastolic dysfunction) CT PE protocol without PE, pulmonary arterial hypertension Head CT without acute abnormality carotid US with 1-39% stenosis on R and 60-79% stenosis in L ICA - bidirectional flow of R vertebral artery, antegrade flow for L vertebral artery EEG - moderate diffuse encephalopathy, MRI not done as he didn't tolerate this Continue tele monitoring, plan for ziopatch outpatient  PT, OT -> recommending home health  Appreciate neurology assistance -> MRI brain outpatient, follow with [facility] for dementia care   Per [facility] statutes, patients with seizures are not allowed to drive until  they have been seizure-free for six months. Use caution when using heavy equipment or power tools. Avoid working on ladders or at heights. Take showers instead of baths. Ensure the water temperature is not too high on the home water heater. Do not go swimming alone. When caring for infants or small children, sit down when holding, feeding, or changing them to minimize risk of injury to the child in the event you have Kassim Guertin seizure. Also, Maintain good sleep hygiene. Avoid alcohol.  Acute Hypoxic Resp Failure  Physician Discharge Summary  William Burke EXB:[id number] DOB: 1940/04/29 DOA: 10/30/2022  PCP: [name], DO  Admit date: 10/30/2022 Discharge date: 11/01/2022  Time spent: 40 minutes  Recommendations for Outpatient Follow-up:  Follow outpatient CBC/CMP  Follow with neurology outpatient, outpatient MRI  Follow ziopatch outpatient  Follow dementia outpatient  Follow O2 needs outpatient, pulmonology referral -> smoking hx, consider COPD, needs further w/u, consider sleep study  Discharge Diagnoses:  Principal Problem:   Acute respiratory failure with hypoxia (HCC) Active Problems:   History of tobacco abuse   PAD (peripheral artery disease) (HCC)   Non-insulin treated type 2 diabetes mellitus (HCC)   Essential hypertension   Dyslipidemia   Dementia (HCC)   AMS (altered mental status)   Discharge Condition: stable  Diet recommendation: heart healthy  Filed Weights   10/30/22 1537 10/31/22 0300  Weight: 83.9 kg 91.3 kg    History of present illness:   82 year old male with past medical history of HLD, Alzheimer's dementia, HTN, PAD, T2DM, GERD, BPH.  Patient lives at home with his wife.  He presented to the hospital after an episode of LOC.  Described to me as period of unresponsiveness with shaking of upper extremity and head which (shaking lasted 2-3 minutes).  Wife unsure if shaking of lower extremities.  His wife called 911 and was unsure if he was breathing and was directed to start CPR.  Patient was in respiratory distress on presentation of EMS and was placed on 15 L NRB.  Didn't respond to narcan.  He was admitted with AMS.  CT PE protocol was negative for PE, head CT without acute abnormality.  He was admitted for his AMS.  Has gradually improved with supportive care.  Neurology c/s with concern for possible seizure.  He didn't tolerate MRI, EEG without seizure activity.  Requiring 3 L [place] with activity.  Will plan to discharge with outpatient neurology follow up,  ziopatch.  Home O2 and Home health services.    Hospital Course:  Assessment and Plan:  Acute Metabolic Encephalopathy Unclear event -> wife describes shaking and what could be considered post ictal period (elevated lactic acid also would fit within this) -> seizure on ddx and seems to be most likely potential cause.  Syncope, arrhythmia, cardiac event also on differential.  Patient with recent echo in July, will defer repeat at this time (EF 60-65^, no RWMA, grade 1 diastolic dysfunction) CT PE protocol without PE, pulmonary arterial hypertension Head CT without acute abnormality carotid US with 1-39% stenosis on R and 60-79% stenosis in L ICA - bidirectional flow of R vertebral artery, antegrade flow for L vertebral artery EEG - moderate diffuse encephalopathy, MRI not done as he didn't tolerate this Continue tele monitoring, plan for ziopatch outpatient  PT, OT -> recommending home health  Appreciate neurology assistance -> MRI brain outpatient, follow with [facility] for dementia care   Per [facility] statutes, patients with seizures are not allowed to drive until  they have been seizure-free for six months. Use caution when using heavy equipment or power tools. Avoid working on ladders or at heights. Take showers instead of baths. Ensure the water temperature is not too high on the home water heater. Do not go swimming alone. When caring for infants or small children, sit down when holding, feeding, or changing them to minimize risk of injury to the child in the event you have Kassim Guertin seizure. Also, Maintain good sleep hygiene. Avoid alcohol.  Acute Hypoxic Resp Failure  mouth 2 (two) times daily with Mikeyla Music meal.   omeprazole 20 MG capsule Commonly known as: PRILOSEC Take 20 mg by mouth daily.   pioglitazone 30 MG tablet Commonly known as: ACTOS Take 30 mg by mouth daily.   saccharomyces boulardii 250 MG capsule Commonly known as: FLORASTOR Take 250 mg by mouth 2 (two) times daily.   Saw Palmetto 450-15 MG Caps Take 1 capsule by mouth daily.   tamsulosin 0.4 MG Caps capsule Commonly known as: FLOMAX Take 1 tablet by mouth daily.   UNABLE TO FIND Med Name: calcium, magnesium, saw palmetto   VICTOZA Mathis Inject into the skin.               Durable Medical Equipment  (From admission, onward)           Start     Ordered   11/01/22 1218  DME Oxygen  Once       Comments: Hypoxic to 86% on RA with activity.  Required 3 L to maintain sats over 90% on RA.  Question Answer Comment  Length of Need 12 Months   Liters per Minute 3   Frequency Continuous (stationary and portable oxygen unit needed)   Oxygen conserving device Yes   Oxygen delivery system Gas      11/01/22 1218           Allergies  Allergen Reactions   Norco [Hydrocodone-Acetaminophen] Other (See Comments)    Pt wife states intolerance to stronger norco meds      The results of significant diagnostics from this hospitalization (including imaging, microbiology, ancillary and laboratory) are listed below for reference.    Significant Diagnostic Studies: VAS US CAROTID  Result Date: 10/31/2022 Carotid Arterial Duplex Study Patient Name:  William Burke  Date of Exam:   10/31/2022 Medical Rec #: [id number]       Accession #:    [id number] Date of Birth: [birth year]      Patient Gender: M Patient Age:   74 years Exam Location:  [facility]  Procedure:      VAS US CAROTID Referring Phys: DEBBY CROSLEY --------------------------------------------------------------------------------  Indications:       Syncope. Risk Factors:      Hypertension, hyperlipidemia, Diabetes, past history of                    smoking, PAD. Other Factors:     RT carotid stent placement 2015. Limitations        Today's exam was limited due to the patient's inability or                    unwillingness to cooperate (agitation & continuous movement),                    the patient's erratic breathing, heavy calcification and the                    resulting diffuse shadowing, the body habitus of the patient. Comparison Study:  Previous exam 07/05/2022 RT 1-39% LT 40-59% Performing Technologist: Jody Hill RVT, RDMS  Examination Guidelines: Tremon Sainvil complete evaluation includes B-mode imaging, spectral Doppler, color Doppler, and power Doppler as needed of all accessible portions of each vessel. Bilateral testing is considered an integral part of Diantha Paxson complete examination. Limited examinations for reoccurring indications may be performed as noted.  Right Carotid Findings: +----------+--------+--------+--------+-------------------------------+--------+           PSV cm/sEDV cm/sStenosisPlaque Description

## 2022-11-02 ENCOUNTER — Telehealth: Payer: Self-pay | Admitting: Physician Assistant

## 2022-11-02 DIAGNOSIS — R55 Syncope and collapse: Secondary | ICD-10-CM

## 2022-11-02 LAB — CULTURE, BLOOD (ROUTINE X 2): Special Requests: ADEQUATE

## 2022-11-02 NOTE — Telephone Encounter (Signed)
Dr. Lacretia Nicks sent request to cardmaster [employer] inbox with request for Zio for possible syncope, 14 day live monitor. Order placed under Dr. Jimmey Ralph per DOD protocol. Will route this msg to monitor team to help mail to patient's house. Patient has been discharged so I was not able to tell him this will be mailed to his house. Monitor team, can you let him know? Thank you!  I already sent staff msg to scheduling team to please call patient to schedule 6 week post-hospital follow-up.

## 2022-11-03 ENCOUNTER — Ambulatory Visit: Payer: Medicare HMO | Attending: Cardiology

## 2022-11-03 DIAGNOSIS — R55 Syncope and collapse: Secondary | ICD-10-CM

## 2022-11-03 NOTE — Progress Notes (Unsigned)
Enrolled for [employer] to mail a ZIO AT Live Telemetry monitor to patients address on file.   Dr. Berry to read. 

## 2022-11-04 LAB — CULTURE, BLOOD (ROUTINE X 2)
Culture: NO GROWTH
Special Requests: ADEQUATE

## 2022-11-07 ENCOUNTER — Telehealth: Payer: Self-pay | Admitting: Neurology

## 2022-11-07 NOTE — Telephone Encounter (Signed)
Pt's wife, Mohammad Granade request refill for memantine (NAMENDA) 10 MG tablet sent to [facility] [phone]

## 2022-11-10 MED ORDER — MEMANTINE HCL 10 MG PO TABS: 10.0000 mg | ORAL_TABLET | Freq: Two times a day (BID) | ORAL | 0 refills | Status: DC

## 2022-11-10 NOTE — Telephone Encounter (Signed)
Refill sent as requested. 

## 2022-11-11 DIAGNOSIS — R55 Syncope and collapse: Secondary | ICD-10-CM | POA: Diagnosis not present

## 2022-11-30 NOTE — Progress Notes (Unsigned)
Cardiology Office Note:  .   Date:  12/04/2022  ID:  William Burke, DOB [birth year], MRN [id number] PCP: [name], DO  [facility] Providers Cardiologist:  Nanetta Batty, MD  History of Present Illness: .   William Burke is a 82 y.o. male with a past medical history of PAD, diabetes, htn, hld, remote history of tobacco use, carotid artery disease. Patient is followed by Dr. Allyson Sabal and presents today for a hospital follow up appointment.   Per chart review, patient previously bilateral iliac stenting in 2009. Later underwent right internal carotid artery stenting in 2015. He had recurrent claudication in 2017 and was found to have 99% instent restenosis of the previously placed right common iliac artery stent. He was restented with improvement in symptoms. He underwent right common femoral endarterectomy, profunda and SFA endarterectomy with patch angioplasty by Dr. Myra Gianotti in 08/2015.   ABIs in 06/2022 were normal on right, indicative of mild lower extremity arterial disease on left. Ultrasound Aorta/IVC/Iliacs in 06/2022 showed >50% stenosis in right and left common iliacs. Recommended repeat studies in 1 year. Carotid ultrasounds 06/2022 showed 1-39% stenosis in right ICA with patent right ICA stent, 40-59% stenosis in the left ICA. Recommended repeat in 12 months   Echocardiogram 07/2022 showed EF 60-65%, no regional wall motion abnormalities, moderate LVH, grade 1 DD, normal RV function, mild aortic valve regurgitation.  Patient was recently admitted from 10/3-10/5/24.  Patient was brought into the ED after he had an episode of unresponsiveness at home.  Per review of notes, patient was in his room getting dressed when his wife heard him make grunting noises.  When she went to check on him, he was up against the bed sliding over.  She kept calling his name, but patient was unable to respond.  Wife noted shaking of the upper extremity and head which lasted 2-3 minutes.  Wife was unsure  if patient was breathing and she was directed to give CPR.  When EMS arrived, patient did have a pulse but appeared to be in respiratory distress.  Placed on nonbreather.  In the ED, patient's initial lactic acid was 6.3, decreased to 4.6 after 2 L IV fluids.  High-sensitivity troponin 17, 28, 32.  CT PE negative for PE, CT head without acute abnormality.  Patient was admitted with altered mental status and had improvement with supportive care.  Suspected seizure.  Neurology was consulted, EEG showed moderate diffuse encephalopathy.  Patient was unable to tolerate MRI.  He was discharged with plans for outpatient neurology follow-up.  Patient also had a Zio patch placed prior to discharge to rule out arrhythmic event.  Patient presents today for a follow up appointment after he was recently hospitalized following an episode of confusion and potential loss of consciousness. The patient does not recall the event, and the cause remains undetermined. The patient's spouse reported finding him in a state of confusion and observed shaking in his upper body. The patient was unconscious upon arrival at the hospital but regained consciousness and orientation shortly after admission. The hospital stay was reportedly challenging due to the patient's confusion and difficulty staying still for necessary tests, including an MRI.  Post-discharge, the patient has been doing well with no recurrence of confusion or other symptoms. He has no memory of the hospital stay.  The patient denies any chest pain, breathing difficulties, or palpitations. He also denies any leg pain or swelling. His blood pressure was well-controlled at the time of the consultation.  The patient is currently on Lipitor and Plavix for his vascular disease. He is seeing a neurologist who plans to obtain brain MRI and EEG   ROS: Patient denies chest pain, palpitations, shortness of breath, ankle edema, claudication, dizziness, syncope, near syncope.     Studies Reviewed: .   Cardiac Studies & Procedures     STRESS TESTS  NM MYOCAR MULTI W/SPECT W 05/20/2007   ECHOCARDIOGRAM  ECHOCARDIOGRAM COMPLETE 07/30/2022  Narrative ECHOCARDIOGRAM REPORT    Patient Name:   IZIC STFORT Date of Exam: 07/30/2022 Medical Rec #:  [id number]      Height:       62.0 in Accession #:    [id number]     Weight:       185.0 lb Date of Birth:  1940/04/07     BSA:          1.849 m Patient Age:    81 years       BP:           156/78 mmHg Patient Gender: M              HR:           80 bpm. Exam Location:  [address]  Procedure: 2D Echo, Cardiac Doppler and Color Doppler  Indications:    R01.1 Murmur  History:        Patient has prior history of Echocardiogram examinations, most recent 05/20/2007. Signs/Symptoms:Murmur; Risk Factors:Hypertension, Dyslipidemia and Diabetes.  Sonographer:    Samule Ohm RDCS Referring Phys: [phone] JONATHAN J BERRY  IMPRESSIONS   1. Left ventricular ejection fraction, by estimation, is 60 to 65%. The left ventricle has normal function. The left ventricle has no regional wall motion abnormalities. There is moderate left ventricular hypertrophy. Left ventricular diastolic parameters are consistent with Grade I diastolic dysfunction (impaired relaxation). 2. Right ventricular systolic function is normal. The right ventricular size is normal. Tricuspid regurgitation signal is inadequate for assessing PA pressure. 3. Left atrial size was mild to moderately dilated. 4. The mitral valve is normal in structure. No evidence of mitral valve regurgitation. Moderate mitral annular calcification. 5. The aortic valve is tricuspid. There is mild calcification of the aortic valve. Aortic valve regurgitation is mild. 6. The inferior vena cava is normal in size with greater than 50% respiratory variability, suggesting right atrial pressure of 3 mmHg.  FINDINGS Left Ventricle: Left ventricular ejection fraction, by estimation,  is 60 to 65%. The left ventricle has normal function. The left ventricle has no regional wall motion abnormalities. The left ventricular internal cavity size was normal in size. There is moderate left ventricular hypertrophy. Left ventricular diastolic parameters are consistent with Grade I diastolic dysfunction (impaired relaxation).  Right Ventricle: The right ventricular size is normal. Right ventricular systolic function is normal. Tricuspid regurgitation signal is inadequate for assessing PA pressure.  Left Atrium: Left atrial size was mild to moderately dilated.  Right Atrium: Right atrial size was normal in size.  Pericardium: There is no evidence of pericardial effusion.  Mitral Valve: The mitral valve is normal in structure. Moderate mitral annular calcification. No evidence of mitral valve regurgitation.  Tricuspid Valve: The tricuspid valve is normal in structure. Tricuspid valve regurgitation is not demonstrated.  Aortic Valve: The aortic valve is tricuspid. There is mild calcification of the aortic valve. Aortic valve regurgitation is mild. Aortic regurgitation PHT measures 473 msec.  Pulmonic Valve: Pulmonic valve regurgitation is mild.  Aorta: The aortic root and ascending aorta are  structurally normal, with no evidence of dilitation.  Venous: The inferior vena cava is normal in size with greater than 50% respiratory variability, suggesting right atrial pressure of 3 mmHg.  IAS/Shunts: The interatrial septum was not well visualized.   LEFT VENTRICLE PLAX 2D LVIDd:         4.00 cm   Diastology LVIDs:         2.80 cm   LV e' medial:    4.90 cm/s LV PW:         1.40 cm   LV E/e' medial:  16.8 LV IVS:        1.70 cm   LV e' lateral:   7.40 cm/s LVOT diam:     2.00 cm   LV E/e' lateral: 11.1 LV SV:         67 LV SV Index:   36 LVOT Area:     3.14 cm   RIGHT VENTRICLE             IVC RV S prime:     15.60 cm/s  IVC diam: 1.00 cm TAPSE (M-mode): 1.4 cm  LEFT ATRIUM              Index        RIGHT ATRIUM           Index LA diam:        4.40 cm 2.38 cm/m   RA Pressure: 3.00 mmHg LA Vol (A2C):   64.5 ml 34.88 ml/m  RA Area:     10.30 cm LA Vol (A4C):   75.6 ml 40.88 ml/m  RA Volume:   23.20 ml  12.54 ml/m LA Biplane Vol: 69.9 ml 37.80 ml/m AORTIC VALVE LVOT Vmax:   99.70 cm/s LVOT Vmean:  68.900 cm/s LVOT VTI:    0.214 m AI PHT:      473 msec  AORTA Ao Root diam: 3.50 cm Ao Asc diam:  3.60 cm  MITRAL VALVE                TRICUSPID VALVE MV Area (PHT): 3.50 cm     Estimated RAP:  3.00 mmHg MV Decel Time: 217 msec MV E velocity: 82.30 cm/s   SHUNTS MV A velocity: 151.00 cm/s  Systemic VTI:  0.21 m MV E/A ratio:  0.55         Systemic Diam: 2.00 cm  Carolan Clines Electronically signed by Carolan Clines Signature Date/Time: 07/30/2022/2:37:15 PM    Final    MONITORS  LONG TERM MONITOR-LIVE TELEMETRY (3-14 DAYS) 12/04/2022  Narrative Patch Wear Time:  13 days and 14 hours (2024-10-15T20:08:54-0400 to 2024-10-29T10:13:49-0400)  Patient had a min HR of 39 bpm, max HR of 185 bpm, and avg HR of 71 bpm. Predominant underlying rhythm was Sinus Rhythm. 37 Supraventricular Tachycardia runs occurred, the run with the fastest interval lasting 13.4 secs with a max rate of 185 bpm (avg 146 bpm); the run with the fastest interval was also the longest. Isolated SVEs were occasional (4.6%, 24401), SVE Couplets were rare (<1.0%, 3207), and SVE Triplets were rare (<1.0%, 1278). Isolated VEs were rare (<1.0%), VE Couplets were rare (<1.0%), and no VE Triplets were present.  SR/SB/ST Occasional PACs/PVCs Short runs of SVT           Risk Assessment/Calculations:             Physical Exam:   VS:  BP 138/60 (BP Location: Left Arm, Patient Position: Sitting, Cuff Size: Normal)   Pulse  80   Ht 5\' 6"  (1.676 m)   Wt 187 lb (84.8 kg)   BMI 30.18 kg/m    Wt Readings from Last 3 Encounters:  12/04/22 187 lb (84.8 kg)  12/04/22 187 lb (84.8 kg)  10/31/22  201 lb 4.5 oz (91.3 kg)    GEN: Well nourished, well developed in no acute distress. Sitting comfortably in the chair  NECK: No JVD  CARDIAC:  RRR, no murmurs, rubs, gallops. Radial pulses 2+ bilaterally  RESPIRATORY:  Clear to auscultation without rales, wheezing or rhonchi =. Normal work of breathing on room air  ABDOMEN: Soft, non-tender, non-distended EXTREMITIES:  No edema in BLE; No deformity   ASSESSMENT AND PLAN: .   Altered Mental Status -Patient was admitted from 10/3 - 10/5 after an episode of altered mental status.  Reportedly, wife saw patient become unresponsive with shaking of the upper extremities and head, followed by what could be considered a postictal period.  When admitted, patient had elevated lactic acid.  Overall, high suspicion for seizure.  Now being followed by neurology as an outpatient  -Recent echocardiogram from 07/30/2022 showed EF 60-65%, no regional wall motion abnormalities, moderate LVH, grade 1 DD, normal RV function, mild aortic valve regurgitation.  Echo was not repeated as an inpatient. - Carotid ultrasounds from 06/2022 showed 1-39% stenosis in right ICA with patent right ICA stent, 40-59% stenosis in left ICA  -To rule out arrhythmia, patient was discharged with a Zio patch. Monitor showed predominantly sinus rhythm with short runs of SVT. Nothing to explain patient's episode of altered mental status  - K 3.7, Na 138, mag 2.0, WBC 7.2, hemoglobin 10.3, TSH 1.158 in 10/2022  - Overall, cardiac workup does not suggest a cause of his symptoms. Continue to follow with neurology   PAD  Previously had bilateral iliac stenting in 2009.  Had repeat stenting of the right common iliac and 2017.  Also underwent right common femoral endarterectomy, profunda and SFA endarterectomy with patch angioplasty -Most recent studies from 06/2022 showed some progression of disease in bilateral iliac arteries.  Patient was not having claudication, recommended repeat ultrasounds in 1  year - Patient denies claudication  -Continue Lipitor, Plavix  Carotid Artery Disease  -Patient previously underwent right ICA stenting in 2015 - Carotid ultrasounds 06/2022 showed 1-39% stenosis in right ICA with patent right ICA stent, 40-59% stenosis in the left ICA. Recommended repeat in 12 months  - Continue lipitor, plavix   HTN  - BP well controlled. Patient denies symptoms of orthostatic hypotension  - Continue current regiment   HLD - Continue lipitor 40 mg daily    Dispo: Follow up in 6 months   Signed, Jonita Albee, PA-C

## 2022-12-03 NOTE — Progress Notes (Unsigned)
PATIENT: William Burke DOB: 12/01/1940  REASON FOR VISIT: follow up for memory HISTORY FROM: patient, wife Primary Neurologist: William Burke   ASSESSMENT AND PLAN  Memory disturbance, Alzheimer's disease History of carotid artery disease, PVD New episode of unresponsiveness October 2024, concerning for first-time seizure  -Order outpatient MRI of the brain with and without contrast, check kidney function in office.  Unable to obtain during hospitalization due to anxiety.  Will try to obtain outpatient, will send in low-dose Xanax 0.25 mg for antianxiety. -Order 72-hour ambulatory video monitor EEG -MMSE 18/30, has had some decline (22/26 May 2021) -Will continue long-term Aricept 10 mg daily, Namenda 10 mg twice daily -He should not drive a car, both in accordance with [place] driving law with possible recent seizure episode as well as memory impairments  -CT head 10/30/22 showed generalized atrophy and small vessel disease.  -EEG showed moderate diffuse encephalopathy.  -Glucose was fine, no response to Narcan, initially high lactic acid to suggest seizure event -Will follow-up in about 4 months with William Burke for new problem of seizure  Orders Placed This Encounter  Procedures   MR BRAIN W WO CONTRAST   Basic Metabolic Panel   AMBULATORY EEG   Meds ordered this encounter  Medications   memantine (NAMENDA) 10 MG tablet    Sig: Take 1 tablet (10 mg total) by mouth 2 (two) times daily.    Dispense:  180 tablet    Refill:  3   donepezil (ARICEPT) 10 MG tablet    Sig: Take 1 tablet (10 mg total) by mouth at bedtime.    Dispense:  90 tablet    Refill:  3   ALPRAZolam (XANAX) 0.25 MG tablet    Sig: Take 1 tablet 30 minutes before MRI, may take a 2nd if needed going into scanner. MUST HAVE DRIVER    Dispense:  2 tablet    Refill:  0    HISTORY OF PRESENT ILLNESS: Today 12/04/22 Admitted 10/31/2022 for episode of syncope followed by shaking of upper and  lower extremities reported as lasting for several minutes, he was not conscious.  Wife witnessed him fall off the bed she assisted to the floor, foaming at the mouth.  EMS arrived patient was in respiratory distress.  CT head showed generalized atrophy and small vessel disease.  Lactic acid was 6.3, improved to 1.7 suggestive of possible seizure.  ESR 27.  Electrolytes unrevealing.  EEG showed continuous slowing, no seizures were seen.  No response to Narcan to suggest opiate overdose.  Glucose 221.  He would not tolerate MRI of the brain. Felt most consistent with seizure. He is not driving, hasn't driven in a year, he lost his truck keys and have not found them. MMSE today 18/30. Memory flucuates, his wife does most things for him. He is sedentary, sits on the porch, he keeps the yard work, Occupational hygienist. No further seizure type events. He still does not recall hospitalization. We talked about getting an MRI, he really doesn't want to do it, even with sedation either Xanax or hospitalization. Wore cardiac monitor, seeing cardiology today. Remains on Aricept and Namenda.   05/16/21 SS: William Burke here today for follow-up.  On Aricept and Namenda.  On BuSpar for anxiety, has been helpful. MMSE 22/30 today. Short term memory is poor. Wife manages medications. Drives short distances, few times has gotten lost but has been able to redirect himself to planned location. Mows the lawn, piddles in the garage, will  be opening swimming pool at their house. Is more sedentary, likes to relax. At times can use profanity if he is upset.   Update 07/16/2020 SS: William Burke is an 82 year old male with history of memory disturbance.  MRI of the brain showed mild atrophy and minimal white matter changes. On Aricept 10 mg at bedtime, doesn't see any change. 1 month ago crazy dreams, started taking the AM. Still trouble with short term memory, asks repetitive questioning. Forgets appointments. Doesn't drive much, his wife does most, mainly  because are always together. Wife manages medications within last year, he was missing doses. Goes on errands, forgets what he went for sometimes. Is history buff, remembers all the dates. Sleeping well, eating well. He enjoys mowing 3 acres, work in [employer]. Doesn't hear well, has hearing aids. Did remember what he did for Father's Day yesterday. Here today with his wife.  HISTORY 01/16/2020 Dr. Anne Burke: William Burke is an 82 year old right-handed white male with a history of a memory disturbance that has been present for over a year.  The patient comes in with his wife today who concurs with the memory issues.  The patient has noted some short-term memory problems, with some difficulty with word finding and recent events.  The patient otherwise is functioning fairly well, he is able to operate a motor vehicle without difficulty.  The patient is able to manage his finances, he keeps up with his medications, he has a bit more difficulty with managing his appointments.  The patient does misplace things about the house on occasion.  He denies any family history of memory issues.  He denies problems with numbness or weakness of the extremities, he denies any significant troubles with headaches or dizziness.  He does have some difficulty with balance but he has not had any falls.  He and his wife believe that his memory troubles have gradually worsened some over time, he comes into the office today for further evaluation.   REVIEW OF SYSTEMS: Out of a complete 14 system review of symptoms, the patient complains only of the following symptoms, and all other reviewed systems are negative.  See HPI  ALLERGIES: Allergies  Allergen Reactions   Norco [Hydrocodone-Acetaminophen] Other (See Comments)    Pt wife states intolerance to stronger norco meds    HOME MEDICATIONS: Outpatient Medications Prior to Visit  Medication Sig Dispense Refill   atorvastatin (LIPITOR) 40 MG tablet Take 40 mg by mouth  daily.     busPIRone (BUSPAR) 10 MG tablet Take 10 mg by mouth 3 (three) times daily.     cetirizine ([employer] ALLERGY RELIEF,CETIRIZINE,) 10 MG tablet Take 10 mg by mouth daily.     clopidogrel (PLAVIX) 75 MG tablet Take 1 tablet (75 mg total) by mouth daily. 30 tablet 3   diltiazem (CARDIZEM CD) 240 MG 24 hr capsule Take 240 mg by mouth daily.     furosemide (LASIX) 20 MG tablet Take 10 mg by mouth daily.     LANTUS SOLOSTAR 100 UNIT/ML Solostar Pen Inject 8 Units into the skin in the morning.     Liraglutide (VICTOZA [place]) Inject into the skin.     lisinopril (ZESTRIL) 20 MG tablet Take 20 mg by mouth daily.     lisinopril-hydrochlorothiazide (PRINZIDE,ZESTORETIC) 20-12.5 MG tablet Take 1 tablet by mouth daily.  1   metFORMIN (GLUCOPHAGE) 1000 MG tablet Take 1,000 mg by mouth 2 (two) times daily with a meal.     omeprazole (PRILOSEC) 20 MG capsule Take  20 mg by mouth daily.  1   pioglitazone (ACTOS) 30 MG tablet Take 30 mg by mouth daily.      saccharomyces boulardii (FLORASTOR) 250 MG capsule Take 250 mg by mouth 2 (two) times daily.     Saw Palmetto 450-15 MG CAPS Take 1 capsule by mouth daily.     tamsulosin (FLOMAX) 0.4 MG CAPS capsule Take 1 tablet by mouth daily.     UNABLE TO FIND Med Name: calcium, magnesium, saw palmetto     donepezil (ARICEPT) 10 MG tablet TAKE 1 TABLET BY MOUTH EVERYDAY AT BEDTIME 90 tablet 0   memantine (NAMENDA) 10 MG tablet Take 1 tablet (10 mg total) by mouth 2 (two) times daily. 60 tablet 0   ferrous sulfate 325 (65 FE) MG tablet Take 325 mg by mouth daily. (Patient not taking: Reported on 12/04/2022)     glipiZIDE (GLUCOTROL) 10 MG tablet Take 2 tablets by mouth in the morning and at bedtime. (Patient not taking: Reported on 12/04/2022)     MAGNESIUM PO Take 1 tablet by mouth daily. (Patient not taking: Reported on 12/04/2022)     Omega-3 Fatty Acids (FISH OIL PO) Take 300 mg by mouth daily. (Patient not taking: Reported on 12/04/2022)     No facility-administered  medications prior to visit.    PAST MEDICAL HISTORY: Past Medical History:  Diagnosis Date   Anemia    Arthritis    osteoarthritis knees, elbow right   Carotid artery disease (HCC)    status post right carotid artery stenting 05/24/13   Complication of anesthesia    sensitive to anesthesia ,longer to awaken-"doesn't require much"   Complication of anesthesia    "can't sleep after anesthesia wears off and i become very anxious"    Diabetes mellitus, type 2 (HCC)    GERD (gastroesophageal reflux disease)    occ   History of bronchitis    History of pneumonia    History of tobacco abuse    Hyperlipidemia    Hypertension    Memory difficulty 01/16/2020   Nocturia    PAD (peripheral artery disease) (HCC) 06/22/2007   Bilateral iliac artery PTA and stenting with iCAST. Last lower extremity arterial Dopplers 02/25/2011 right ABI 1.0 left ABI 1.1.  Bilateral carotid artery stenosis. Last carotid Dopplers 07/18/2011: Max ICA stenosis 50-69% bilaterally   UTI (lower urinary tract infection)    Wears glasses     PAST SURGICAL HISTORY: Past Surgical History:  Procedure Laterality Date   APPENDECTOMY     ruptured as child taken out as adult   Bowl surgery  1989   to free stricture-part of colon removed from appendix issue   CARDIAC CATHETERIZATION     aortogram iliac artery stent   CAROTID ENDARTERECTOMY     CAROTID STENT Right 05/24/2013   CAROTID STENT INSERTION Right 05/24/2013   Procedure: CAROTID STENT INSERTION;  Surgeon: Nada Libman, MD;  Location: [facility] CATH LAB;  Service: Cardiovascular;  Laterality: Right;   COLONOSCOPY     ENDARTERECTOMY Right 09/02/2012   Procedure: ENDARTERECTOMY CAROTID with Resection of Common Carotid Artery;  Surgeon: Nada Libman, MD;  Location: [facility] OR;  Service: Vascular;  Laterality: Right;   ENDARTERECTOMY FEMORAL Right 08/30/2015   Procedure: RIGHT FEMORAL ENDARTERECTOMY;  Surgeon: Nada Libman, MD;  Location: [facility] OR;  Service: Vascular;   Laterality: Right;   LEG ANGIOGRAPHY  07/23/2015   1. abdominal aortogram, bilateral iliac angiogram, bifemoral runoff   LEG ANGIOGRAPHY  07/23/2015  2. PTA and covered stent right common iliac artery   NASAL POLYP SURGERY     PARTIAL KNEE ARTHROPLASTY Left 01/08/2015   Procedure: LEFT  UNI KNEE ARTHOLPLASTY MEDIALLY;  Surgeon: Durene Romans, MD;  Location: [facility] ORS;  Service: Orthopedics;  Laterality: Left;   PATCH ANGIOPLASTY Right 09/02/2012   Procedure: PATCH ANGIOPLASTY using Vascu-Guard Vascular Patch;  Surgeon: Nada Libman, MD;  Location: [facility] OR;  Service: Vascular;  Laterality: Right;leg   PATCH ANGIOPLASTY Right 08/30/2015   Procedure: PATCH ANGIOPLASTY;  Surgeon: Nada Libman, MD;  Location: [facility] OR;  Service: Vascular;  Laterality: Right;   PERIPHERAL VASCULAR CATHETERIZATION N/A 07/23/2015   Procedure: Lower Extremity Angiography;  Surgeon: Runell Gess, MD;  Location: [facility] INVASIVE CV LAB;  Service: Cardiovascular;  Laterality: N/A;   PERIPHERAL VASCULAR CATHETERIZATION Right 07/23/2015   Procedure: Peripheral Vascular Intervention;  Surgeon: Runell Gess, MD;  Location: [facility] INVASIVE CV LAB;  Service: Cardiovascular;  Laterality: Right;  Rt Common Iliac   TONSILLECTOMY     WRIST SURGERY Right    pinning    FAMILY HISTORY: Family History  Problem Relation Age of Onset   Diabetes Mother    Hypertension Mother    Cerebral aneurysm Mother    Multiple sclerosis Father    Kidney disease Father     SOCIAL HISTORY: Social History   Socioeconomic History   Marital status: Married    Spouse name: Bjorn Loser   Number of children: Not on file   Years of education: Not on file   Highest education level: Not on file  Occupational History   Not on file  Tobacco Use   Smoking status: Former    Current packs/day: 0.00    Average packs/day: 3.0 packs/day for 25.0 years (75.0 ttl pk-yrs)    Types: Cigarettes    Start date: 08/06/1962    Quit date: 08/06/1987    Years since  quitting: 35.3   Smokeless tobacco: Never  Substance and Sexual Activity   Alcohol use: No   Drug use: No   Sexual activity: Not on file  Other Topics Concern   Not on file  Social History Narrative   Lives with wife   Right Handed   Drinks 6-8 cups caffeine daily   Social Determinants of Health   Financial Resource Strain: Not on file  Food Insecurity: Low Risk  (11/03/2022)   Received from [facility]   Hunger Vital Sign    Worried About Running Out of Food in the Last Year: Never true    Ran Out of Food in the Last Year: Never true  Transportation Needs: Unmet Transportation Needs (11/03/2022)   Received from [facility]    In the past 12 months, has lack of reliable transportation kept you from medical appointments, meetings, work or from getting things needed for daily living? : Yes  Physical Activity: Not on file  Stress: Not on file  Social Connections: Not on file  Intimate Partner Violence: Not At Risk (10/31/2022)   Humiliation, Afraid, Rape, and Kick questionnaire    Fear of Current or Ex-Partner: No    Emotionally Abused: No    Physically Abused: No    Sexually Abused: No   PHYSICAL EXAM  Vitals:   12/04/22 1313  BP: 128/66  Weight: 187 lb (84.8 kg)  Height: 5\' 6"  (1.676 m)     Body mass index is 30.18 kg/m.  Generalized: Well developed, in no acute distress  12/04/2022    1:19 PM 05/16/2021    1:34 PM 07/16/2020    1:32 PM  MMSE - Mini Mental State Exam  Orientation to time 2 3 4   Orientation to Place 4 4 5   Registration 3 3 3   Attention/ Calculation 1 4 5   Recall 0 0 2  Language- name 2 objects 2 2 2   Language- repeat 0 1 1  Language- follow 3 step command 3 3 3   Language- read & follow direction 1 0 1  Write a sentence 1 1 1   Copy design 1 1 1   Copy design-comments   Animals w/ four legs in one minute: 11  Total score 18 22 28    Neurological examination  Mentation: Alert , oriented, wife provides pertinent history,  he does repeat himself, asked me several same questions.  Follows exam commands well.  Speech is clear. Cranial nerve II-XII: Pupils were equal round reactive to light. Extraocular movements were full, visual field were full on confrontational test. Facial sensation and strength were normal.  Head turning and shoulder shrug  were normal and symmetric. Motor: The motor testing reveals 5 over 5 strength of all 4 extremities. Good symmetric motor tone is noted throughout.  Sensory: Sensory testing is intact to soft touch on all 4 extremities. No evidence of extinction is noted.  Coordination: Cerebellar testing reveals good finger-nose-finger and heel-to-shin bilaterally.  Gait and station: Gait is normal. Reflexes: Deep tendon reflexes are symmetric but depressed throughout   DIAGNOSTIC DATA (LABS, IMAGING, TESTING) - I reviewed patient records, labs, notes, testing and imaging myself where available.  Lab Results  Component Value Date   WBC 7.2 11/01/2022   HGB 10.3 (L) 11/01/2022   HCT 32.0 (L) 11/01/2022   MCV 95.2 11/01/2022   PLT 162 11/01/2022      Component Value Date/Time   NA 138 11/01/2022 0229   K 3.7 11/01/2022 0229   CL 102 11/01/2022 0229   CO2 27 11/01/2022 0229   GLUCOSE 157 (H) 11/01/2022 0229   BUN 16 11/01/2022 0229   CREATININE 1.31 (H) 11/01/2022 0229   CREATININE 0.74 07/18/2015 1035   CALCIUM 8.7 (L) 11/01/2022 0229   PROT 6.3 (L) 11/01/2022 0229   PROT 6.9 08/01/2020 1224   ALBUMIN 3.3 (L) 11/01/2022 0229   ALBUMIN 4.5 08/01/2020 1224   AST 23 11/01/2022 0229   ALT 20 11/01/2022 0229   ALKPHOS 57 11/01/2022 0229   BILITOT 0.8 11/01/2022 0229   BILITOT 0.4 08/01/2020 1224   GFRNONAA 54 (L) 11/01/2022 0229   GFRAA >60 08/31/2015 0446   Lab Results  Component Value Date   CHOL 137 08/01/2020   HDL 57 08/01/2020   LDLCALC 63 08/01/2020   TRIG 88 08/01/2020   CHOLHDL 2.4 08/01/2020   Lab Results  Component Value Date   HGBA1C 7.7 (H) 10/31/2022    Lab Results  Component Value Date   VITAMINB12 429 10/31/2022   Lab Results  Component Value Date   TSH 1.158 10/31/2022    Margie Ege, AGNP-C, DNP 12/04/2022, 2:17 PM [facility] [address] [place], [place] [address] [phone]

## 2022-12-04 ENCOUNTER — Encounter: Payer: Self-pay | Admitting: Neurology

## 2022-12-04 ENCOUNTER — Ambulatory Visit: Payer: Medicare HMO | Admitting: Neurology

## 2022-12-04 ENCOUNTER — Encounter: Payer: Self-pay | Admitting: Cardiology

## 2022-12-04 ENCOUNTER — Ambulatory Visit: Payer: Medicare HMO | Attending: Cardiology | Admitting: Cardiology

## 2022-12-04 ENCOUNTER — Telehealth: Payer: Self-pay | Admitting: Neurology

## 2022-12-04 VITALS — BP 138/60 | HR 80 | Ht 66.0 in | Wt 187.0 lb

## 2022-12-04 VITALS — BP 128/66 | Ht 66.0 in | Wt 187.0 lb

## 2022-12-04 DIAGNOSIS — G40909 Epilepsy, unspecified, not intractable, without status epilepticus: Secondary | ICD-10-CM

## 2022-12-04 DIAGNOSIS — I779 Disorder of arteries and arterioles, unspecified: Secondary | ICD-10-CM

## 2022-12-04 DIAGNOSIS — F02B0 Dementia in other diseases classified elsewhere, moderate, without behavioral disturbance, psychotic disturbance, mood disturbance, and anxiety: Secondary | ICD-10-CM | POA: Diagnosis not present

## 2022-12-04 DIAGNOSIS — G301 Alzheimer's disease with late onset: Secondary | ICD-10-CM | POA: Diagnosis not present

## 2022-12-04 DIAGNOSIS — Z09 Encounter for follow-up examination after completed treatment for conditions other than malignant neoplasm: Secondary | ICD-10-CM | POA: Diagnosis not present

## 2022-12-04 DIAGNOSIS — R569 Unspecified convulsions: Secondary | ICD-10-CM | POA: Insufficient documentation

## 2022-12-04 DIAGNOSIS — R55 Syncope and collapse: Secondary | ICD-10-CM

## 2022-12-04 DIAGNOSIS — I739 Peripheral vascular disease, unspecified: Secondary | ICD-10-CM | POA: Diagnosis not present

## 2022-12-04 DIAGNOSIS — E785 Hyperlipidemia, unspecified: Secondary | ICD-10-CM

## 2022-12-04 DIAGNOSIS — I10 Essential (primary) hypertension: Secondary | ICD-10-CM | POA: Diagnosis not present

## 2022-12-04 DIAGNOSIS — R4182 Altered mental status, unspecified: Secondary | ICD-10-CM

## 2022-12-04 MED ORDER — ALPRAZOLAM 0.25 MG PO TABS: ORAL_TABLET | ORAL | 0 refills | Status: AC

## 2022-12-04 MED ORDER — DONEPEZIL HCL 10 MG PO TABS: 10.0000 mg | ORAL_TABLET | Freq: Every day | ORAL | 3 refills | Status: DC

## 2022-12-04 MED ORDER — MEMANTINE HCL 10 MG PO TABS: 10.0000 mg | ORAL_TABLET | Freq: Two times a day (BID) | ORAL | 3 refills | Status: DC

## 2022-12-04 NOTE — Patient Instructions (Addendum)
Will check EEG, MRI brain, once MRI is scheduled let me know. I will send in Xanax but am going to talk with Dr. Vickey Huger about dosing

## 2022-12-04 NOTE — Patient Instructions (Signed)
Medication Instructions:  No changes *If you need a refill on your cardiac medications before your next appointment, please call your pharmacy*   Lab Work: No labs   Testing/Procedures: No testing   Follow-Up: At [facility], you and your health needs are our priority.  As part of our continuing mission to provide you with exceptional heart care, we have created designated Provider Care Teams.  These Care Teams include your primary Cardiologist (physician) and Advanced Practice Providers (APPs -  Physician Assistants and Nurse Practitioners) who all work together to provide you with the care you need, when you need it.  We recommend signing up for the patient portal called "MyChart".  Sign up information is provided on this After Visit Summary.  MyChart is used to connect with patients for Virtual Visits (Telemedicine).  Patients are able to view lab/test results, encounter notes, upcoming appointments, etc.  Non-urgent messages can be sent to your provider as well.   To learn more about what you can do with MyChart, go to [email or web address].    Your next appointment:   6 month(s)  Provider:   Nanetta Batty, MD

## 2022-12-04 NOTE — Telephone Encounter (Signed)
[employer] medicare sent to [facility] they obtain auth [id number] 

## 2022-12-05 LAB — BASIC METABOLIC PANEL
BUN/Creatinine Ratio: 18 (ref 10–24)
BUN: 30 mg/dL — ABNORMAL HIGH (ref 8–27)
CO2: 20 mmol/L (ref 20–29)
Calcium: 9.5 mg/dL (ref 8.6–10.2)
Chloride: 103 mmol/L (ref 96–106)
Creatinine, Ser: 1.66 mg/dL — ABNORMAL HIGH (ref 0.76–1.27)
Glucose: 162 mg/dL — ABNORMAL HIGH (ref 70–99)
Potassium: 4.2 mmol/L (ref 3.5–5.2)
Sodium: 142 mmol/L (ref 134–144)
eGFR: 41 mL/min/{1.73_m2} — ABNORMAL LOW (ref >59)

## 2022-12-08 ENCOUNTER — Telehealth: Payer: Self-pay

## 2022-12-08 NOTE — Telephone Encounter (Signed)
Video ambulatory eeg order form faxed to [facility]

## 2023-04-09 ENCOUNTER — Ambulatory Visit: Payer: Medicare HMO | Admitting: Neurology

## 2023-05-22 ENCOUNTER — Ambulatory Visit: Admitting: Neurology

## 2023-08-19 ENCOUNTER — Ambulatory Visit (HOSPITAL_BASED_OUTPATIENT_CLINIC_OR_DEPARTMENT_OTHER)
Admission: RE | Admit: 2023-08-19 | Discharge: 2023-08-19 | Disposition: A | Discharge status: Home / Self Care | Source: Ambulatory Visit | Attending: Cardiovascular Disease | Admitting: Cardiovascular Disease

## 2023-08-19 ENCOUNTER — Ambulatory Visit (HOSPITAL_COMMUNITY)
Admission: RE | Admit: 2023-08-19 | Discharge: 2023-08-19 | Disposition: A | Discharge status: Home / Self Care | Source: Ambulatory Visit | Attending: Cardiovascular Disease | Admitting: Cardiovascular Disease

## 2023-08-19 DIAGNOSIS — I739 Peripheral vascular disease, unspecified: Secondary | ICD-10-CM

## 2023-08-19 DIAGNOSIS — Z95820 Peripheral vascular angioplasty status with implants and grafts: Secondary | ICD-10-CM | POA: Diagnosis not present

## 2023-08-19 LAB — VAS US ABI WITH/WO TBI

## 2023-08-21 ENCOUNTER — Ambulatory Visit: Payer: Self-pay | Admitting: Cardiovascular Disease

## 2023-08-21 DIAGNOSIS — I739 Peripheral vascular disease, unspecified: Secondary | ICD-10-CM

## 2023-09-18 ENCOUNTER — Encounter (HOSPITAL_COMMUNITY)

## 2023-11-02 ENCOUNTER — Ambulatory Visit (HOSPITAL_COMMUNITY)
Admission: RE | Admit: 2023-11-02 | Discharge: 2023-11-02 | Disposition: A | Discharge status: Home / Self Care | Source: Ambulatory Visit | Attending: Surgery | Admitting: Surgery

## 2023-11-02 ENCOUNTER — Other Ambulatory Visit (HOSPITAL_COMMUNITY): Payer: Self-pay | Admitting: Family Medicine

## 2023-11-02 DIAGNOSIS — I779 Disorder of arteries and arterioles, unspecified: Secondary | ICD-10-CM | POA: Diagnosis present

## 2023-11-02 DIAGNOSIS — E119 Type 2 diabetes mellitus without complications: Secondary | ICD-10-CM | POA: Diagnosis not present

## 2023-11-02 DIAGNOSIS — E785 Hyperlipidemia, unspecified: Secondary | ICD-10-CM | POA: Insufficient documentation

## 2023-11-02 DIAGNOSIS — I6523 Occlusion and stenosis of bilateral carotid arteries: Secondary | ICD-10-CM | POA: Insufficient documentation

## 2023-11-02 DIAGNOSIS — Z87891 Personal history of nicotine dependence: Secondary | ICD-10-CM | POA: Insufficient documentation

## 2023-11-02 DIAGNOSIS — I10 Essential (primary) hypertension: Secondary | ICD-10-CM | POA: Diagnosis not present

## 2023-11-18 ENCOUNTER — Encounter: Payer: Self-pay | Admitting: Neurology

## 2023-11-18 ENCOUNTER — Ambulatory Visit: Admitting: Neurology

## 2023-11-18 VITALS — BP 184/72 | HR 60 | Ht 66.0 in | Wt 196.2 lb

## 2023-11-18 DIAGNOSIS — G301 Alzheimer's disease with late onset: Secondary | ICD-10-CM

## 2023-11-18 DIAGNOSIS — F02A0 Dementia in other diseases classified elsewhere, mild, without behavioral disturbance, psychotic disturbance, mood disturbance, and anxiety: Secondary | ICD-10-CM

## 2023-11-18 DIAGNOSIS — R569 Unspecified convulsions: Secondary | ICD-10-CM

## 2023-11-18 DIAGNOSIS — R296 Repeated falls: Secondary | ICD-10-CM | POA: Diagnosis not present

## 2023-11-18 NOTE — Progress Notes (Signed)
 PATIENT: William Burke DOB: [birth year]  REASON FOR VISIT: follow up for memory HISTORY FROM: patient, wife Primary Neurologist: Dr. Aurora. Lekendrick Alpern   ASSESSMENT AND PLAN  Memory disturbance, Alzheimer's disease History of carotid artery disease, PVD Seizure like activity   -Continue long-term Aricept  10 mg daily, Namenda  10 mg twice daily -Referral to physical therapy for recurrent fall -Continue to follow PCP -Return in 1 year or sooner if worse   Orders Placed This Encounter  Procedures   Ambulatory referral to Physical Therapy   No orders of the defined types were placed in this encounter.   HISTORY OF PRESENT ILLNESS: Today 11/18/23 Patient presents today for follow-up, he is accompanied by her wife.  Last visit was a year ago.  At that time he was being worked up for seizures.  MRI brain and Amb EEG was ordered but patient did not complete it.  He tells me that he did not have any additional seizure-like events.  He is compliant with his medications.  Wife tells me that his memory is stable sometime he asks questions about his kids visiting when they have not visited him for quite some time. Wife reports that he has been falling. Denies any major injury from the falls.  Denies any difficulty with his ADLs, he does not currently drive a car but he is still mows the yard    INTERVAL HISTORY SS 11/03/2022 Admitted 10/31/2022 for episode of syncope followed by shaking of upper and lower extremities reported as lasting for several minutes, he was not conscious.  Wife witnessed him fall off the bed she assisted to the floor, foaming at the mouth.  EMS arrived patient was in respiratory distress.  CT head showed generalized atrophy and small vessel disease.  Lactic acid was 6.3, improved to 1.7 suggestive of possible seizure.  ESR 27.  Electrolytes unrevealing.  EEG showed continuous slowing, no seizures were seen.  No response to Narcan to suggest opiate overdose.  Glucose 221.   He would not tolerate MRI of the brain. Felt most consistent with seizure. He is not driving, hasn't driven in a year, he lost his truck keys and have not found them. MMSE today 18/30. Memory flucuates, his wife does most things for him. He is sedentary, sits on the porch, he keeps the yard work, Occupational hygienist. No further seizure type events. He still does not recall hospitalization. We talked about getting an MRI, he really doesn't want to do it, even with sedation either Xanax  or hospitalization. Wore cardiac monitor, seeing cardiology today. Remains on Aricept  and Namenda .   05/16/21 SS: William Burke here today for follow-up.  On Aricept  and Namenda .  On BuSpar for anxiety, has been helpful. MMSE 22/30 today. Short term memory is poor. Wife manages medications. Drives short distances, few times has gotten lost but has been able to redirect himself to planned location. Mows the lawn, piddles in the garage, will be opening swimming pool at their house. Is more sedentary, likes to relax. At times can use profanity if he is upset.   Update 07/16/2020 SS: William Burke is an 83 year old male with history of memory disturbance.  MRI of the brain showed mild atrophy and minimal white matter changes. On Aricept  10 mg at bedtime, doesn't see any change. 1 month ago crazy dreams, started taking the AM. Still trouble with short term memory, asks repetitive questioning. Forgets appointments. Doesn't drive much, his wife does most, mainly because are always together. Wife manages medications within last  year, he was missing doses. Goes on errands, forgets what he went for sometimes. Is history buff, remembers all the dates. Sleeping well, eating well. He enjoys mowing 3 acres, work in [employer]. Doesn't hear well, has hearing aids. Did remember what he did for Father's Day yesterday. Here today with his wife.  HISTORY 01/16/2020 Dr. Jenel: William Burke is an 83 year old white male with a history of a memory  disturbance that has been present for over a year.  The patient comes in with his wife today who concurs with the memory issues.  The patient has noted some short-term memory problems, with some difficulty with word finding and recent events.  The patient otherwise is functioning fairly well, he is able to operate a motor vehicle without difficulty.  The patient is able to manage his finances, he keeps up with his medications, he has a bit more difficulty with managing his appointments.  The patient does misplace things about the house on occasion.  He denies any family history of memory issues.  He denies problems with numbness or weakness of the extremities, he denies any significant troubles with headaches or dizziness.  He does have some difficulty with balance but he has not had any falls.  He and his wife believe that his memory troubles have gradually worsened some over time, he comes into the office today for further evaluation.   REVIEW OF SYSTEMS: Out of a complete 14 system review of symptoms, the patient complains only of the following symptoms, and all other reviewed systems are negative.  See HPI  ALLERGIES: Allergies  Allergen Reactions   Norco [Hydrocodone -Acetaminophen ] Other (See Comments)    Pt wife states intolerance to stronger norco meds    HOME MEDICATIONS: Outpatient Medications Prior to Visit  Medication Sig Dispense Refill   atorvastatin  (LIPITOR ) 40 MG tablet Take 40 mg by mouth daily.     busPIRone (BUSPAR) 10 MG tablet Take 10 mg by mouth 3 (three) times daily.     clopidogrel  (PLAVIX ) 75 MG tablet Take 1 tablet (75 mg total) by mouth daily. 30 tablet 3   diltiazem  (CARDIZEM  CD) 240 MG 24 hr capsule Take 240 mg by mouth daily.     diltiazem  (CARDIZEM  CD) 300 MG 24 hr capsule Take 300 mg by mouth daily.     donepezil  (ARICEPT ) 10 MG tablet Take 1 tablet (10 mg total) by mouth at bedtime. 90 tablet 3   furosemide (LASIX) 20 MG tablet Take 10 mg by mouth daily.      LANTUS  SOLOSTAR 100 UNIT/ML Solostar Pen Inject 8 Units into the skin in the morning.     lisinopril  (ZESTRIL ) 20 MG tablet Take by mouth.     lisinopril -hydrochlorothiazide  (PRINZIDE ,ZESTORETIC ) 20-12.5 MG tablet Take 1 tablet by mouth daily.  1   memantine  (NAMENDA ) 10 MG tablet Take 1 tablet (10 mg total) by mouth 2 (two) times daily. 180 tablet 3   NOVOLOG  FLEXPEN 100 UNIT/ML FlexPen PLEASE SEE ATTACHED FOR DETAILED DIRECTIONS     omeprazole  (PRILOSEC) 20 MG capsule Take 20 mg by mouth daily.  1   pioglitazone (ACTOS) 30 MG tablet Take 30 mg by mouth daily.      tamsulosin  (FLOMAX ) 0.4 MG CAPS capsule Take 1 tablet by mouth daily.     ALPRAZolam  (XANAX ) 0.25 MG tablet Take 1 tablet 30 minutes before MRI, may take a 2nd if needed going into scanner. MUST HAVE DRIVER (Patient not taking: Reported on 11/18/2023) 2 tablet 0  cetirizine ([facility] ALLERGY RELIEF,CETIRIZINE,) 10 MG tablet Take 10 mg by mouth daily. (Patient not taking: Reported on 11/18/2023)     Liraglutide (VICTOZA [place]) Inject into the skin. (Patient not taking: Reported on 11/18/2023)     metFORMIN  (GLUCOPHAGE ) 1000 MG tablet Take 1,000 mg by mouth 2 (two) times daily with a meal. (Patient not taking: Reported on 11/18/2023)     Saw Palmetto  450-15 MG CAPS Take 1 capsule by mouth daily. (Patient not taking: Reported on 11/18/2023)     UNABLE TO FIND Med Name: calcium , magnesium , saw palmetto  (Patient not taking: Reported on 11/18/2023)     No facility-administered medications prior to visit.    PAST MEDICAL HISTORY: Past Medical History:  Diagnosis Date   Anemia    Arthritis    osteoarthritis knees, elbow right   Carotid artery disease    status post right carotid artery stenting 05/24/13   Complication of anesthesia    sensitive to anesthesia ,longer to awaken-doesn't require much   Complication of anesthesia    can't sleep after anesthesia wears off and i become very anxious    Diabetes mellitus, type 2 (HCC)    GERD  (gastroesophageal reflux disease)    occ   History of bronchitis    History of pneumonia    History of tobacco abuse    Hyperlipidemia    Hypertension    Memory difficulty 01/16/2020   Nocturia    PAD (peripheral artery disease) 06/22/2007   Bilateral iliac artery PTA and stenting with iCAST. Last lower extremity arterial Dopplers 02/25/2011 right ABI 1.0 left ABI 1.1.  Bilateral carotid artery stenosis. Last carotid Dopplers 07/18/2011: Max ICA stenosis 50-69% bilaterally   UTI (lower urinary tract infection)    Wears glasses     PAST SURGICAL HISTORY: Past Surgical History:  Procedure Laterality Date   APPENDECTOMY     ruptured as child taken out as adult   Bowl surgery  1989   to free stricture-part of colon removed from appendix issue   CARDIAC CATHETERIZATION     aortogram iliac artery stent   CAROTID ENDARTERECTOMY     CAROTID STENT Right 05/24/2013   CAROTID STENT INSERTION Right 05/24/2013   Procedure: CAROTID STENT INSERTION;  Surgeon: Gaile LELON New, MD;  Location: [facility] CATH LAB;  Service: Cardiovascular;  Laterality: Right;   COLONOSCOPY     ENDARTERECTOMY Right 09/02/2012   Procedure: ENDARTERECTOMY CAROTID with Resection of Common Carotid Artery;  Surgeon: Gaile LELON New, MD;  Location: [facility] OR;  Service: Vascular;  Laterality: Right;   ENDARTERECTOMY FEMORAL Right 08/30/2015   Procedure: RIGHT FEMORAL ENDARTERECTOMY;  Surgeon: Gaile LELON New, MD;  Location: [facility] OR;  Service: Vascular;  Laterality: Right;   LEG ANGIOGRAPHY  07/23/2015   1. abdominal aortogram, bilateral iliac angiogram, bifemoral runoff   LEG ANGIOGRAPHY  07/23/2015   2. PTA and covered stent right common iliac artery   NASAL POLYP SURGERY     PARTIAL KNEE ARTHROPLASTY Left 01/08/2015   Procedure: LEFT  UNI KNEE ARTHOLPLASTY MEDIALLY;  Surgeon: Donnice Car, MD;  Location: [facility] ORS;  Service: Orthopedics;  Laterality: Left;   PATCH ANGIOPLASTY Right 09/02/2012   Procedure: PATCH ANGIOPLASTY using  Vascu-Guard Vascular Patch;  Surgeon: Gaile LELON New, MD;  Location: [facility] OR;  Service: Vascular;  Laterality: Right;leg   PATCH ANGIOPLASTY Right 08/30/2015   Procedure: PATCH ANGIOPLASTY;  Surgeon: Gaile LELON New, MD;  Location: [facility] OR;  Service: Vascular;  Laterality: Right;   PERIPHERAL VASCULAR CATHETERIZATION N/A  07/23/2015   Procedure: Lower Extremity Angiography;  Surgeon: Dorn JINNY Lesches, MD;  Location: [facility] INVASIVE CV LAB;  Service: Cardiovascular;  Laterality: N/A;   PERIPHERAL VASCULAR CATHETERIZATION Right 07/23/2015   Procedure: Peripheral Vascular Intervention;  Surgeon: Dorn JINNY Lesches, MD;  Location: [facility] INVASIVE CV LAB;  Service: Cardiovascular;  Laterality: Right;  Rt Common Iliac   TONSILLECTOMY     WRIST SURGERY Right    pinning    FAMILY HISTORY: Family History  Problem Relation Age of Onset   Diabetes Mother    Hypertension Mother    Cerebral aneurysm Mother    Multiple sclerosis Father    Kidney disease Father     SOCIAL HISTORY: Social History   Socioeconomic History   Marital status: Married    Spouse name: Shona   Number of children: Not on file   Years of education: Not on file   Highest education level: Not on file  Occupational History   Not on file  Tobacco Use   Smoking status: Former    Current packs/day: 0.00    Average packs/day: 3.0 packs/day for 25.0 years (75.0 ttl pk-yrs)    Types: Cigarettes    Start date: 08/06/1962    Quit date: 08/06/1987    Years since quitting: 36.3   Smokeless tobacco: Never  Substance and Sexual Activity   Alcohol use: No   Drug use: No   Sexual activity: Not on file  Other Topics Concern   Not on file  Social History Narrative   Lives with wife   Right Handed   Drinks 6-8 cups caffeine daily ---> 2-3 in the total being coffee    Social Drivers of Corporate investment banker Strain: Not on file  Food Insecurity: Low Risk  (09/21/2023)   Received from [facility]   Hunger Vital Sign    Within the past  12 months, you worried that your food would run out before you got money to buy more: Never true    Within the past 12 months, the food you bought just didn't last and you didn't have money to get more. : Never true  Transportation Needs: No Transportation Needs (09/21/2023)   Received from [facility]    In the past 12 months, has lack of reliable transportation kept you from medical appointments, meetings, work or from getting things needed for daily living? : No  Physical Activity: Not on file  Stress: Not on file  Social Connections: Not on file  Intimate Partner Violence: Not At Risk (10/31/2022)   Humiliation, Afraid, Rape, and Kick questionnaire    Fear of Current or Ex-Partner: No    Emotionally Abused: No    Physically Abused: No    Sexually Abused: No   PHYSICAL EXAM  Vitals:   11/18/23 1548  BP: (!) 184/72  Pulse: 60  SpO2: 95%  Weight: 196 lb 3.2 oz (89 kg)  Height: 5' 6 (1.676 m)      Body mass index is 31.67 kg/m.  Generalized: Well developed, in no acute distress     11/18/2023    3:57 PM 12/04/2022    1:19 PM 05/16/2021    1:34 PM  MMSE - Mini Mental State Exam  Orientation to time 4 2 3   Orientation to Place 5 4 4   Registration 3 3 3   Attention/ Calculation 3 1 4   Recall 0 0 0  Language- name 2 objects 2 2 2   Language- repeat 1 0 1  Language- follow 3 step command 3 3 3   Language- read & follow direction 1 1 0  Write a sentence 1 1 1   Copy design 1 1 1   Total score 24 18 22    Neurological examination  Mentation: Alert , oriented, wife provides pertinent history, he does repeat himself, asked me several same questions.  Follows exam commands well.  Speech is clear. Cranial nerve II-XII: Pupils were equal round reactive to light. Extraocular movements were full, visual field were full on confrontational test. Facial sensation and strength were normal.  Head turning and shoulder shrug  were normal and symmetric. Motor: The motor  testing reveals 5 over 5 strength of all 4 extremities. Good symmetric motor tone is noted throughout.  Sensory: Sensory testing is intact to soft touch on all 4 extremities. No evidence of extinction is noted.  Coordination: Cerebellar testing reveals good finger-nose-finger and heel-to-shin bilaterally.  Gait and station: Gait is normal. Reflexes: Deep tendon reflexes are symmetric but depressed throughout   DIAGNOSTIC DATA (LABS, IMAGING, TESTING) - I reviewed patient records, labs, notes, testing and imaging myself where available.  Lab Results  Component Value Date   WBC 7.2 11/01/2022   HGB 10.3 (L) 11/01/2022   HCT 32.0 (L) 11/01/2022   MCV 95.2 11/01/2022   PLT 162 11/01/2022      Component Value Date/Time   NA 142 12/04/2022 1412   K 4.2 12/04/2022 1412   CL 103 12/04/2022 1412   CO2 20 12/04/2022 1412   GLUCOSE 162 (H) 12/04/2022 1412   GLUCOSE 157 (H) 11/01/2022 0229   BUN 30 (H) 12/04/2022 1412   CREATININE 1.66 (H) 12/04/2022 1412   CREATININE 0.74 07/18/2015 1035   CALCIUM  9.5 12/04/2022 1412   PROT 6.3 (L) 11/01/2022 0229   PROT 6.9 08/01/2020 1224   ALBUMIN 3.3 (L) 11/01/2022 0229   ALBUMIN 4.5 08/01/2020 1224   AST 23 11/01/2022 0229   ALT 20 11/01/2022 0229   ALKPHOS 57 11/01/2022 0229   BILITOT 0.8 11/01/2022 0229   BILITOT 0.4 08/01/2020 1224   GFRNONAA 54 (L) 11/01/2022 0229   GFRAA >60 08/31/2015 0446   Lab Results  Component Value Date   CHOL 137 08/01/2020   HDL 57 08/01/2020   LDLCALC 63 08/01/2020   TRIG 88 08/01/2020   CHOLHDL 2.4 08/01/2020   Lab Results  Component Value Date   HGBA1C 7.7 (H) 10/31/2022   Lab Results  Component Value Date   VITAMINB12 429 10/31/2022   Lab Results  Component Value Date   TSH 1.158 10/31/2022    Pastor Falling, MD  11/18/2023, 5:06 PM [facility] [address], [address] [place], [place] [address] [phone]

## 2023-11-19 ENCOUNTER — Telehealth: Payer: Self-pay | Admitting: Neurology

## 2023-11-19 NOTE — Telephone Encounter (Signed)
 Referral To Physical Therapy faxed to [facility] in [facility] in [place] Phone:[phone] Fax: [phone]

## 2024-01-03 ENCOUNTER — Other Ambulatory Visit: Payer: Self-pay | Admitting: Neurology

## 2024-01-26 ENCOUNTER — Other Ambulatory Visit: Payer: Self-pay

## 2024-01-26 ENCOUNTER — Telehealth: Payer: Self-pay | Admitting: Neurology

## 2024-01-26 MED ORDER — MEMANTINE HCL 10 MG PO TABS: 10.0000 mg | ORAL_TABLET | Freq: Two times a day (BID) | ORAL | 3 refills | Status: AC

## 2024-01-26 MED ORDER — DONEPEZIL HCL 10 MG PO TABS: 10.0000 mg | ORAL_TABLET | Freq: Every day | ORAL | 3 refills | Status: AC

## 2024-01-26 NOTE — Telephone Encounter (Signed)
 Pt is requesting a refill for donepezil  (ARICEPT ) 10 MG tablet & memantine  (NAMENDA ) 10 MG tablet  .  [facility] ID: [phone]

## 2024-11-22 ENCOUNTER — Ambulatory Visit: Admitting: Neurology
